# Patient Record
Sex: Female | Born: 1970
Health system: Southern US, Community
[De-identification: ages and names within clinical notes are randomized; demographics above are authoritative.]

## PROBLEM LIST (undated history)

## (undated) DIAGNOSIS — F419 Anxiety disorder, unspecified: Secondary | ICD-10-CM

## (undated) DIAGNOSIS — K219 Gastro-esophageal reflux disease without esophagitis: Secondary | ICD-10-CM

## (undated) DIAGNOSIS — Z8619 Personal history of other infectious and parasitic diseases: Secondary | ICD-10-CM

## (undated) DIAGNOSIS — E559 Vitamin D deficiency, unspecified: Secondary | ICD-10-CM

## (undated) DIAGNOSIS — M549 Dorsalgia, unspecified: Secondary | ICD-10-CM

## (undated) DIAGNOSIS — R609 Edema, unspecified: Secondary | ICD-10-CM

## (undated) DIAGNOSIS — M9420 Chondromalacia, unspecified site: Secondary | ICD-10-CM

## (undated) DIAGNOSIS — I7300 Raynaud's syndrome without gangrene: Secondary | ICD-10-CM

## (undated) DIAGNOSIS — M2550 Pain in unspecified joint: Secondary | ICD-10-CM

## (undated) DIAGNOSIS — M7580 Other shoulder lesions, unspecified shoulder: Secondary | ICD-10-CM

## (undated) DIAGNOSIS — F32A Depression, unspecified: Secondary | ICD-10-CM

## (undated) DIAGNOSIS — F329 Major depressive disorder, single episode, unspecified: Secondary | ICD-10-CM

## (undated) HISTORY — DX: Other shoulder lesions, unspecified shoulder: M75.80

## (undated) HISTORY — DX: Depression, unspecified: F32.A

## (undated) HISTORY — DX: Raynaud's syndrome without gangrene: I73.00

## (undated) HISTORY — DX: Anxiety disorder, unspecified: F41.9

## (undated) HISTORY — DX: Pain in unspecified joint: M25.50

## (undated) HISTORY — DX: Gastro-esophageal reflux disease without esophagitis: K21.9

## (undated) HISTORY — DX: Major depressive disorder, single episode, unspecified: F32.9

## (undated) HISTORY — DX: Dorsalgia, unspecified: M54.9

## (undated) HISTORY — DX: Edema, unspecified: R60.9

## (undated) HISTORY — DX: Vitamin D deficiency, unspecified: E55.9

## (undated) HISTORY — DX: Chondromalacia, unspecified site: M94.20

## (undated) HISTORY — DX: Personal history of other infectious and parasitic diseases: Z86.19

## (undated) HISTORY — PX: TONSILLECTOMY: SUR1361

---

## 1999-03-25 ENCOUNTER — Other Ambulatory Visit: Admission: RE | Admit: 1999-03-25 | Discharge: 1999-03-25 | Payer: Self-pay | Admitting: Gynecology

## 2000-03-26 ENCOUNTER — Other Ambulatory Visit: Admission: RE | Admit: 2000-03-26 | Discharge: 2000-03-26 | Payer: Self-pay | Admitting: Obstetrics and Gynecology

## 2000-03-28 ENCOUNTER — Emergency Department (HOSPITAL_COMMUNITY): Admission: EM | Admit: 2000-03-28 | Discharge: 2000-03-28 | Payer: Self-pay | Admitting: Emergency Medicine

## 2000-03-29 ENCOUNTER — Encounter: Payer: Self-pay | Admitting: Emergency Medicine

## 2001-03-17 ENCOUNTER — Other Ambulatory Visit: Admission: RE | Admit: 2001-03-17 | Discharge: 2001-03-17 | Payer: Self-pay | Admitting: Obstetrics and Gynecology

## 2002-04-14 ENCOUNTER — Other Ambulatory Visit: Admission: RE | Admit: 2002-04-14 | Discharge: 2002-04-14 | Payer: Self-pay | Admitting: Obstetrics and Gynecology

## 2003-05-17 ENCOUNTER — Other Ambulatory Visit: Admission: RE | Admit: 2003-05-17 | Discharge: 2003-05-17 | Payer: Self-pay | Admitting: Obstetrics and Gynecology

## 2003-05-22 ENCOUNTER — Encounter: Admission: RE | Admit: 2003-05-22 | Discharge: 2003-05-22 | Payer: Self-pay | Admitting: Obstetrics and Gynecology

## 2004-06-11 ENCOUNTER — Other Ambulatory Visit: Admission: RE | Admit: 2004-06-11 | Discharge: 2004-06-11 | Payer: Self-pay | Admitting: Obstetrics and Gynecology

## 2004-09-23 ENCOUNTER — Encounter: Admission: RE | Admit: 2004-09-23 | Discharge: 2004-09-23 | Payer: Self-pay | Admitting: Orthopedic Surgery

## 2005-02-10 HISTORY — PX: SHOULDER SURGERY: SHX246

## 2008-07-13 ENCOUNTER — Ambulatory Visit (HOSPITAL_COMMUNITY): Admission: RE | Admit: 2008-07-13 | Discharge: 2008-07-13 | Payer: Self-pay | Admitting: Obstetrics and Gynecology

## 2009-02-10 DIAGNOSIS — Z8619 Personal history of other infectious and parasitic diseases: Secondary | ICD-10-CM

## 2009-02-10 HISTORY — DX: Personal history of other infectious and parasitic diseases: Z86.19

## 2012-05-27 ENCOUNTER — Ambulatory Visit (INDEPENDENT_AMBULATORY_CARE_PROVIDER_SITE_OTHER): Payer: BC Managed Care – PPO | Admitting: Family Medicine

## 2012-05-27 ENCOUNTER — Encounter: Payer: Self-pay | Admitting: Family Medicine

## 2012-05-27 VITALS — BP 129/89 | HR 70 | Ht 61.0 in | Wt 172.0 lb

## 2012-05-27 DIAGNOSIS — R002 Palpitations: Secondary | ICD-10-CM

## 2012-05-27 DIAGNOSIS — R5381 Other malaise: Secondary | ICD-10-CM

## 2012-05-27 DIAGNOSIS — R42 Dizziness and giddiness: Secondary | ICD-10-CM

## 2012-05-27 LAB — CBC WITH DIFFERENTIAL/PLATELET
Basophils Absolute: 0 10*3/uL (ref 0.0–0.1)
Basophils Relative: 0 % (ref 0–1)
Eosinophils Absolute: 0.1 10*3/uL (ref 0.0–0.7)
Eosinophils Relative: 1 % (ref 0–5)
HCT: 40.6 % (ref 36.0–46.0)
Hemoglobin: 13.7 g/dL (ref 12.0–15.0)
Lymphocytes Relative: 26 % (ref 12–46)
Lymphs Abs: 1.8 10*3/uL (ref 0.7–4.0)
MCH: 30.7 pg (ref 26.0–34.0)
MCHC: 33.7 g/dL (ref 30.0–36.0)
MCV: 91 fL (ref 78.0–100.0)
Monocytes Absolute: 0.4 10*3/uL (ref 0.1–1.0)
Monocytes Relative: 7 % (ref 3–12)
Neutro Abs: 4.4 10*3/uL (ref 1.7–7.7)
Neutrophils Relative %: 66 % (ref 43–77)
Platelets: 317 10*3/uL (ref 150–400)
RBC: 4.46 MIL/uL (ref 3.87–5.11)
RDW: 13.1 % (ref 11.5–15.5)
WBC: 6.7 10*3/uL (ref 4.0–10.5)

## 2012-05-27 LAB — COMPREHENSIVE METABOLIC PANEL
ALT: 17 U/L (ref 0–35)
AST: 18 U/L (ref 0–37)
Albumin: 4.4 g/dL (ref 3.5–5.2)
Alkaline Phosphatase: 53 U/L (ref 39–117)
BUN: 19 mg/dL (ref 6–23)
CO2: 27 mEq/L (ref 19–32)
Calcium: 9.1 mg/dL (ref 8.4–10.5)
Chloride: 104 mEq/L (ref 96–112)
Creat: 0.81 mg/dL (ref 0.50–1.10)
Glucose, Bld: 83 mg/dL (ref 70–99)
Potassium: 4.8 mEq/L (ref 3.5–5.3)
Sodium: 138 mEq/L (ref 135–145)
Total Bilirubin: 0.4 mg/dL (ref 0.3–1.2)
Total Protein: 6.7 g/dL (ref 6.0–8.3)

## 2012-05-27 MED ORDER — NIFEDIPINE ER OSMOTIC RELEASE 30 MG PO TB24: 30.0000 mg | ORAL_TABLET | Freq: Every day | ORAL | Status: DC

## 2012-05-27 MED ORDER — MECLIZINE HCL 25 MG PO TABS: 25.0000 mg | ORAL_TABLET | Freq: Three times a day (TID) | ORAL | Status: DC | PRN

## 2012-05-27 NOTE — Progress Notes (Signed)
Subjective:     Patient ID: Grace Horton, female   DOB: 23-Mar-1970, 42 y.o.   MRN: [id number]  HPI Grace Horton is here today complaining of dizziness.  She has been feeling dizzy for about four days.  She describes it as being moderate to severe.  She says that she was getting her hair done yesterday and became so dizzy that she collapsed onto the floor.  She says that she felt so weak that she was unable to get up for about an hour.  Her friends noticed that her bottom lip looked blue.  She was outside and said that she was very cold.  Someone gave her a meclizine to take and she says that it resolved her dizziness.  She also has been monitoring her blood pressure which was higher than normal. She feels perfectly fine today. She did notice that this winter her toes and fingers turned lighter when they got cold.      Review of Systems  Constitutional: Positive for activity change. Negative for fever, fatigue and unexpected weight change (She has lost weight but has been working out.  ).  HENT: Negative for congestion, neck pain and neck stiffness.   Eyes: Negative for visual disturbance.  Respiratory: Negative for shortness of breath.   Cardiovascular: Positive for palpitations. Negative for chest pain and leg swelling.  Neurological: Positive for dizziness. Negative for syncope, weakness and headaches.  Psychiatric/Behavioral: Negative for dysphoric mood. The patient is not nervous/anxious.    Past Medical History  Diagnosis Date  . Chondromalacia   . AC (acromioclavicular) joint bone spurs   . History of shingles     Around Left Eye   Family History  Problem Relation Age of Onset  . Heart disease Father   . Stroke Father        Objective:   Physical Exam  Constitutional: She appears well-nourished. No distress.  HENT:  Head: Normocephalic and atraumatic.  Eyes: Pupils are equal, round, and reactive to light. No scleral icterus.  Neck: Neck supple. No thyromegaly present.   Cardiovascular: Normal rate, regular rhythm and normal heart sounds.   Pulmonary/Chest: Effort normal and breath sounds normal.  Musculoskeletal: She exhibits no edema and no tenderness.  Lymphadenopathy:    She has no cervical adenopathy.  Neurological: She is alert.  Skin: Skin is warm and dry.  Psychiatric: She has a normal mood and affect. Her behavior is normal. Judgment and thought content normal.       Assessment:     Dizziness Raynaud's Syndrome     Plan:     1)  Prescription for Antivert

## 2012-05-27 NOTE — Patient Instructions (Addendum)
1)  Dizziness:  Try the Antivert 1/2 - 1 tab up to 3 times per day.  Try the Hall-Pike maneuver I showed you.  You can also look on You Tube.    Dizziness Dizziness is a common problem. It is a feeling of unsteadiness or lightheadedness. You may feel like you are about to faint. Dizziness can lead to injury if you stumble or fall. A person of any age group can suffer from dizziness, but dizziness is more common in older adults. CAUSES  Dizziness can be caused by many different things, including:  Middle ear problems.  Standing for too long.  Infections.  An allergic reaction.  Aging.  An emotional response to something, such as the sight of blood.  Side effects of medicines.  Fatigue.  Problems with circulation or blood pressure.  Excess use of alcohol, medicines, or illegal drug use.  Breathing too fast (hyperventilation).  An arrhythmia or problems with your heart rhythm.  Low red blood cell count (anemia).  Pregnancy.  Vomiting, diarrhea, fever, or other illnesses that cause dehydration.  Diseases or conditions such as Parkinson's disease, high blood pressure (hypertension), diabetes, and thyroid problems.  Exposure to extreme heat. DIAGNOSIS  To find the cause of your dizziness, your caregiver may do a physical exam, lab tests, radiologic imaging scans, or an electrocardiography test (ECG).  TREATMENT  Treatment of dizziness depends on the cause of your symptoms and can vary greatly. HOME CARE INSTRUCTIONS   Drink enough fluids to keep your urine clear or pale yellow. This is especially important in very hot weather. In the elderly, it is also important in cold weather.  If your dizziness is caused by medicines, take them exactly as directed. When taking blood pressure medicines, it is especially important to get up slowly.  Rise slowly from chairs and steady yourself until you feel okay.  In the morning, first sit up on the side of the bed. When this seems  okay, stand slowly while holding onto something until you know your balance is fine.  If you need to stand in one place for a long time, be sure to move your legs often. Tighten and relax the muscles in your legs while standing.  If dizziness continues to be a problem, have someone stay with you for a day or two. Do this until you feel you are well enough to stay alone. Have the person call your caregiver if he or she notices changes in you that are concerning.  Do not drive or use heavy machinery if you feel dizzy.  Do not drink alcohol. SEEK IMMEDIATE MEDICAL CARE IF:   Your dizziness or lightheadedness gets worse.  You feel nauseous or vomit.  You develop problems with talking, walking, weakness, or using your arms, hands, or legs.  You are not thinking clearly or you have difficulty forming sentences. It may take a friend or family member to determine if your thinking is normal.  You develop chest pain, abdominal pain, shortness of breath, or sweating.  Your vision changes.  You notice any bleeding.  You have side effects from medicine that seems to be getting worse rather than better. MAKE SURE YOU:   Understand these instructions.  Will watch your condition.  Will get help right away if you are not doing well or get worse. Document Released: 07/23/2000 Document Revised: 04/21/2011 Document Reviewed: 08/16/2010 [facility] Patient Information 2013 [place], [place].   Vertigo Vertigo means you feel like you or your surroundings are moving  when they are not. Vertigo can be dangerous if it occurs when you are at work, driving, or performing difficult activities.  CAUSES  Vertigo occurs when there is a conflict of signals sent to your brain from the visual and sensory systems in your body. There are many different causes of vertigo, including:  Infections, especially in the inner ear.  A bad reaction to a drug or misuse of alcohol and medicines.  Withdrawal from drugs or  alcohol.  Rapidly changing positions, such as lying down or rolling over in bed.  A migraine headache.  Decreased blood flow to the brain.  Increased pressure in the brain from a head injury, infection, tumor, or bleeding. SYMPTOMS  You may feel as though the world is spinning around or you are falling to the ground. Because your balance is upset, vertigo can cause nausea and vomiting. You may have involuntary eye movements (nystagmus). DIAGNOSIS  Vertigo is usually diagnosed by physical exam. If the cause of your vertigo is unknown, your caregiver may perform imaging tests, such as an MRI scan (magnetic resonance imaging). TREATMENT  Most cases of vertigo resolve on their own, without treatment. Depending on the cause, your caregiver may prescribe certain medicines. If your vertigo is related to body position issues, your caregiver may recommend movements or procedures to correct the problem. In rare cases, if your vertigo is caused by certain inner ear problems, you may need surgery. HOME CARE INSTRUCTIONS   Follow your caregiver's instructions.  Avoid driving.  Avoid operating heavy machinery.  Avoid performing any tasks that would be dangerous to you or others during a vertigo episode.  Tell your caregiver if you notice that certain medicines seem to be causing your vertigo. Some of the medicines used to treat vertigo episodes can actually make them worse in some people. SEEK IMMEDIATE MEDICAL CARE IF:   Your medicines do not relieve your vertigo or are making it worse.  You develop problems with talking, walking, weakness, or using your arms, hands, or legs.  You develop severe headaches.  Your nausea or vomiting continues or gets worse.  You develop visual changes.  A family member notices behavioral changes.  Your condition gets worse. MAKE SURE YOU:  Understand these instructions.  Will watch your condition.  Will get help right away if you are not doing well or  get worse. Document Released: 11/06/2004 Document Revised: 04/21/2011 Document Reviewed: 08/15/2010 [facility] Patient Information 2013 [place], [place].

## 2012-05-29 ENCOUNTER — Encounter: Payer: Self-pay | Admitting: Family Medicine

## 2012-05-30 ENCOUNTER — Encounter: Payer: Self-pay | Admitting: Family Medicine

## 2012-05-30 DIAGNOSIS — R5383 Other fatigue: Secondary | ICD-10-CM | POA: Insufficient documentation

## 2012-05-30 DIAGNOSIS — R5381 Other malaise: Secondary | ICD-10-CM | POA: Insufficient documentation

## 2012-05-30 DIAGNOSIS — R42 Dizziness and giddiness: Secondary | ICD-10-CM | POA: Insufficient documentation

## 2012-05-30 DIAGNOSIS — R002 Palpitations: Secondary | ICD-10-CM | POA: Insufficient documentation

## 2012-05-31 ENCOUNTER — Encounter: Payer: Self-pay | Admitting: *Deleted

## 2012-06-23 ENCOUNTER — Encounter: Payer: Self-pay | Admitting: Family Medicine

## 2012-06-23 ENCOUNTER — Ambulatory Visit (INDEPENDENT_AMBULATORY_CARE_PROVIDER_SITE_OTHER): Payer: BC Managed Care – PPO | Admitting: Family Medicine

## 2012-06-23 ENCOUNTER — Telehealth: Payer: Self-pay | Admitting: *Deleted

## 2012-06-23 VITALS — BP 111/77 | HR 69 | Wt 175.0 lb

## 2012-06-23 DIAGNOSIS — S61209A Unspecified open wound of unspecified finger without damage to nail, initial encounter: Secondary | ICD-10-CM

## 2012-06-23 DIAGNOSIS — Z23 Encounter for immunization: Secondary | ICD-10-CM

## 2012-06-23 DIAGNOSIS — S61219A Laceration without foreign body of unspecified finger without damage to nail, initial encounter: Secondary | ICD-10-CM

## 2012-06-23 MED ORDER — MUPIROCIN CALCIUM 2 % EX CREA: TOPICAL_CREAM | CUTANEOUS | Status: DC

## 2012-06-23 NOTE — Telephone Encounter (Signed)
HELP

## 2012-06-23 NOTE — Patient Instructions (Addendum)
1)  Apply the Bactroban at first sign of infection.    Staphylococcal Infections  Staphylococcus aureus (Staph) is a germ that may cause infections, especially on broken skin or wounds. Methicillin is a drug sometimes used to treat Staph infections. If the germ is resistant to methicillin, it is called MRSA. Methicillin and some other drugs may not work to treat the infection. However, there are other antibiotic drugs that may be used that will treat the infection. Your caregiver may do a culture from your wound, skin, or other site. This will tell him/her that you have MRSA present. Sometimes healthy people carry MRSA, but it may also cause an infection.  Staph infections, including MRSA can spread from one person to another by contact with an infected person. You may prevent spreading an MRSA infection to those you live with or others around you by following these steps:  Keep infections and pus or drainage material covered with clean dry bandages. Follow your caregiver's instructions on proper care of the wound. Pus from infected wounds can contain MRSA and spread the germ (bacteria) to others.  Advise your family and other close contacts to wash their hands frequently with soap and warm water. This should be done especially if they change your bandages or touch the infected wound or infectious materials.  Avoid sharing personal items (towels, washcloth, razor, clothing, or uniforms) that may have had contact with the infected wound.  Wash linens and clothes that become soiled, with hot water and laundry detergent. Drying clothes in a hot dryer, rather than air-drying, also helps kill bacteria in clothes.  Tell any healthcare providers who treat you that you have an MRSA infection. In the hospital steps will be taken to prevent the spread of MRSA.  Ask your caregiver about return to school or return to work if you have a Staph or MRSA infection.  If your caregiver has given you a follow-up  appointment, it is very important to keep that appointment. Not keeping the appointment could result in a chronic or permanent injury, pain, and disability. If there is any problem keeping the appointment, you must call back to this facility for assistance. Staph and MRSA infections can become very serious and you should contact your caregiver if your infection gets worse. To fight the infection, follow your doctor's instructions for wound care and take all medicines as prescribed. SEEK MEDICAL CARE IF:   You have increased pus coming from the wound.  You have a fever.  You notice a bad smell coming from the wound or dressing. SEEK IMMEDIATE MEDICAL CARE IF:  You have redness, red streaks, swelling, or increasing pain in the wound. Document Released: 04/19/2002 Document Revised: 04/21/2011 Document Reviewed: 09/13/2007 [facility] Patient Information 2013 [place], [place].

## 2012-06-23 NOTE — Progress Notes (Signed)
  Subjective:    Patient ID: Grace Horton, female    DOB: [birth year], 42 y.o.   MRN: [id number]  HPI Grace Horton is here today to have her left thumb evaluated.  She was cutting fruit two days ago and she accidentally cut her left thumb.  She cleaned it very well and applied some Dermabond Liquid Bandage which helped stop the bandage.  She has not had a tetanus shot in over 10 years and would like to get one.  Her dizziness has improved.  She watched the videos on YouTube about vertigo which helped. She has also started taking some supplements for her Raynaud's which she thinks has helped her.     Review of Systems  Constitutional: Negative for fever.  Skin: Positive for wound (laceration on left thumb).  Neurological: Negative for dizziness (Her dizziness resolved.  ).   Past Medical History  Diagnosis Date  . Chondromalacia   . AC (acromioclavicular) joint bone spurs   . History of shingles 2011    Around Left Eye  . Raynaud disease    Family History  Problem Relation Age of Onset  . Heart disease Father   . 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 No distress.  Skin: Skin is warm and dry. No erythema.  She has a 1 cm laceration on the lateral aspect of her left thumb that seems to be healing.  There is currently no sign of infection.       Assessment & Plan:   1)  Laceration:  She was given a prescription for Bactroban to apply if she develops any sign of infection. She received her Tdap without difficulty.

## 2012-09-10 ENCOUNTER — Encounter: Payer: Self-pay | Admitting: *Deleted

## 2012-11-10 ENCOUNTER — Encounter: Payer: Self-pay | Admitting: Family Medicine

## 2012-11-10 ENCOUNTER — Ambulatory Visit (INDEPENDENT_AMBULATORY_CARE_PROVIDER_SITE_OTHER): Payer: BC Managed Care – PPO | Admitting: Family Medicine

## 2012-11-10 VITALS — BP 133/84 | HR 75 | Resp 16 | Ht 61.42 in | Wt 192.0 lb

## 2012-11-10 DIAGNOSIS — E669 Obesity, unspecified: Secondary | ICD-10-CM | POA: Insufficient documentation

## 2012-11-10 DIAGNOSIS — Z23 Encounter for immunization: Secondary | ICD-10-CM

## 2012-11-10 MED ORDER — PHENTERMINE-TOPIRAMATE ER 7.5-46 MG PO CP24: 1.0000 | ORAL_CAPSULE | Freq: Every day | ORAL | Status: DC

## 2012-11-10 MED ORDER — PHENTERMINE-TOPIRAMATE ER 3.75-23 MG PO CP24: 1.0000 | ORAL_CAPSULE | Freq: Every day | ORAL | Status: DC

## 2012-11-10 NOTE — Patient Instructions (Addendum)
1)  Weight  Start on Qsymia 3.75 mg/23 mg for 14 days then increase to the 7.5 mg/46 mg.    1200 calories (Low Carb/High Protein Diet) 1 Hour of Exercise    Exercise to Lose Weight Exercise and a healthy diet may help you lose weight. Your doctor may suggest specific exercises. EXERCISE IDEAS AND TIPS  Choose low-cost things you enjoy doing, such as walking, bicycling, or exercising to workout videos.  Take stairs instead of the elevator.  Walk during your lunch break.  Park your car further away from work or school.  Go to a gym or an exercise class.  Start with 5 to 10 minutes of exercise each day. Build up to 30 minutes of exercise 4 to 6 days a week.  Wear shoes with good support and comfortable clothes.  Stretch before and after working out.  Work out until you breathe harder and your heart beats faster.  Drink extra water when you exercise.  Do not do so much that you hurt yourself, feel dizzy, or get very short of breath. Exercises that burn about 150 calories:  Running 1  miles in 15 minutes.  Playing volleyball for 45 to 60 minutes.  Washing and waxing a car for 45 to 60 minutes.  Playing touch football for 45 minutes.  Walking 1  miles in 35 minutes.  Pushing a stroller 1  miles in 30 minutes.  Playing basketball for 30 minutes.  Raking leaves for 30 minutes.  Bicycling 5 miles in 30 minutes.  Walking 2 miles in 30 minutes.  Dancing for 30 minutes.  Shoveling snow for 15 minutes.  Swimming laps for 20 minutes.  Walking up stairs for 15 minutes.  Bicycling 4 miles in 15 minutes.  Gardening for 30 to 45 minutes.  Jumping rope for 15 minutes.  Washing windows or floors for 45 to 60 minutes. Document Released: 03/01/2010 Document Revised: 04/21/2011 Document Reviewed: 03/01/2010 [facility] Patient Information 2014 [place], [place].

## 2012-11-10 NOTE — Progress Notes (Signed)
  Subjective:    Patient ID: Grace Horton, female    DOB: 09-28-1970, 42 y.o.   MRN: [id number]  HPI  Grace Horton is here today to discuss her weight.  She has gained a considerable about over the past 4 years.  She is not sure what to blame for the weight gain.  She feels that it is partly due to her turning 38, being less active and being in a long term relationship.  She has been struggling to lose weight over the past several months.  She has been working hard to improve her diet by cutting carbs and alcohol and she has been trying to increase her exercise.  She feels that she is not getting anywhere with her current plan.  She has noticed that she has been having increased pain and attributes this pain to her increased weight which is starting to limit her exercise.  She has tried several appetite suppressants in the past which have helped her.   She has been reading about Qsymia and would like to try this medication.       Review of Systems  Constitutional: Negative.   HENT: Negative.   Eyes: Negative.   Respiratory: Negative.   Cardiovascular: Negative.   Gastrointestinal: Negative.   Endocrine: Negative.   Genitourinary: Negative.   Musculoskeletal: Negative.   Skin: Negative.   Allergic/Immunologic: Negative.   Neurological: Negative.   Hematological: Negative.   Psychiatric/Behavioral: Negative.     Past Medical History  Diagnosis Date  . Chondromalacia   . AC (acromioclavicular) joint bone spurs   . History of shingles 2011    Around Left Eye  . Raynaud disease     Family History  Problem Relation Age of Onset  . Heart disease Father   . Stroke Father     History   Social History Narrative   Marital Status: Divorced; (Significant Other - Todd)    Children:  Daughter Dahlia Client) 1997    Pets:  Dog    Living Situation: Lives with Tawanna Cooler and daughter.     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 Constitutional: She is oriented to person, place, and time.  Well groomed female with obese body habitus   HENT:  Head: Normocephalic and atraumatic.  Eyes: Conjunctivae are normal. No scleral icterus.  Neck: Normal range of motion. Neck supple. No thyromegaly present.  Cardiovascular: Normal rate and regular rhythm.   Pulmonary/Chest: Effort normal and breath sounds normal.  Musculoskeletal: She exhibits no edema.  Neurological: She is alert and oriented to person, place, and time.  Skin: Skin is warm and dry. No rash noted.  Psychiatric: She has a normal mood and affect. Her behavior is normal. Judgment and thought content normal.          Assessment & Plan:

## 2012-11-10 NOTE — Assessment & Plan Note (Signed)
She was given a prescription for Qsymia. She is to follow a 1200 calorie diet and get at least 1 hour of exercise daily.  She is to follow up in 3 months for a recheck.  We set a weight loss goal for her of 15 lbs (5 per month) by her next office visit.

## 2012-12-16 ENCOUNTER — Other Ambulatory Visit: Payer: Self-pay

## 2013-02-17 ENCOUNTER — Encounter: Payer: Self-pay | Admitting: Family Medicine

## 2013-02-17 ENCOUNTER — Ambulatory Visit (INDEPENDENT_AMBULATORY_CARE_PROVIDER_SITE_OTHER): Payer: BC Managed Care – PPO | Admitting: Family Medicine

## 2013-02-17 VITALS — BP 111/80 | HR 81 | Resp 16 | Ht 61.0 in | Wt 170.0 lb

## 2013-02-17 DIAGNOSIS — E669 Obesity, unspecified: Secondary | ICD-10-CM

## 2013-02-17 DIAGNOSIS — Z23 Encounter for immunization: Secondary | ICD-10-CM | POA: Insufficient documentation

## 2013-02-17 MED ORDER — PHENTERMINE-TOPIRAMATE ER 7.5-46 MG PO CP24: 1.0000 | ORAL_CAPSULE | Freq: Every day | ORAL | Status: DC

## 2013-02-17 NOTE — Patient Instructions (Addendum)
1)  Weight - Keep up the good work.  The next thing you need to do is increase your exercise.  Goal - 1 hour per day.      Exercise to Lose Weight Exercise and a healthy diet may help you lose weight. Your doctor may suggest specific exercises. EXERCISE IDEAS AND TIPS  Choose low-cost things you enjoy doing, such as walking, bicycling, or exercising to workout videos.  Take stairs instead of the elevator.  Walk during your lunch break.  Park your car further away from work or school.  Go to a gym or an exercise class.  Start with 5 to 10 minutes of exercise each day. Build up to 30 minutes of exercise 4 to 6 days a week.  Wear shoes with good support and comfortable clothes.  Stretch before and after working out.  Work out until you breathe harder and your heart beats faster.  Drink extra water when you exercise.  Do not do so much that you hurt yourself, feel dizzy, or get very short of breath. Exercises that burn about 150 calories:  Running 1  miles in 15 minutes.  Playing volleyball for 45 to 60 minutes.  Washing and waxing a car for 45 to 60 minutes.  Playing touch football for 45 minutes.  Walking 1  miles in 35 minutes.  Pushing a stroller 1  miles in 30 minutes.  Playing basketball for 30 minutes.  Raking leaves for 30 minutes.  Bicycling 5 miles in 30 minutes.  Walking 2 miles in 30 minutes.  Dancing for 30 minutes.  Shoveling snow for 15 minutes.  Swimming laps for 20 minutes.  Walking up stairs for 15 minutes.  Bicycling 4 miles in 15 minutes.  Gardening for 30 to 45 minutes.  Jumping rope for 15 minutes.  Washing windows or floors for 45 to 60 minutes. Document Released: 03/01/2010 Document Revised: 04/21/2011 Document Reviewed: 03/01/2010 [facility] Patient Information 2014 [place], [place].

## 2013-02-17 NOTE — Progress Notes (Signed)
Subjective:    Patient ID: Grace Horton, female    DOB: [birth year], 43 y.o.   MRN: [id number]  HPI  Grace Horton is here today to get her medication refills.  Grace Horton has done great on her Qsymia (7.5-46 mg, daily).  Grace Horton has lost 22 lbs since her last office visit.  Grace Horton would like to continue on it.     Review of Systems  Constitutional: Positive for appetite change. Negative for activity change (Grace Horton is planning to start exercising.  ) and fatigue.       Grace Horton has lost 22 lbs!    Respiratory: Negative for chest tightness and shortness of breath.   Cardiovascular: Negative for chest pain and palpitations.  Neurological:       Occasional tingling sensation in the tip of her nose and toes   Psychiatric/Behavioral: Negative for sleep disturbance and decreased concentration.    Past Medical History  Diagnosis Date  . Chondromalacia   . AC (acromioclavicular) joint bone spurs   . History of shingles 2011    Around Left Eye  . Raynaud disease      Past Surgical History  Procedure Laterality Date  . Tonsillectomy       History   Social History Narrative   Marital Status: Divorced; (Significant Other - Grace Horton)    Children:  Grace Horton Grace Horton) 1997    Pets:  Dog    Living Situation: Lives with Grace Horton and Grace Horton.     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 Heart disease Father   . Diabetes Father   . Kidney disease Father     Stage III   . Transient ischemic attack Father     S/P Carotid endarterectomy  . Irritable bowel syndrome Mother      Current Outpatient Prescriptions on File Prior to Visit  Medication Sig Dispense Refill  . ethynodiol-ethinyl estradiol (KELNOR,ZOVIA) 1-35 MG-MCG tablet Take 1  tablet by mouth daily.      . meclizine (ANTIVERT) 25 MG tablet Take 1 tablet (25 mg total) by mouth 3 (three) times daily as needed for dizziness or nausea.  30 tablet  1  . valACYclovir (VALTREX) 500 MG tablet        No current facility-administered medications on file prior to visit.     No Known Allergies   Immunization History  Administered Date(s) Administered  . Influenza,inj,Quad PF,36+ Mos 11/10/2012  . Tdap 06/23/2012      Objective:   Physical Exam  Vitals reviewed. Constitutional: Grace Horton is oriented to person, place, and time.  HENT:  Head: Normocephalic and atraumatic.  Eyes: Conjunctivae are normal. No scleral icterus.  Neck: Normal range of motion. Neck supple. No thyromegaly present.  Cardiovascular: Normal rate and regular rhythm.   Pulmonary/Chest: Effort normal and breath sounds normal.  Musculoskeletal: Grace Horton exhibits no edema.  Neurological: Grace Horton is alert and oriented to person, place, and time.  Skin: Skin is warm and dry. No rash noted.  Psychiatric: Grace Horton has a normal mood and affect. Her behavior is normal. Judgment and thought content normal.      Assessment & Plan:  Gerldine was seen today for obesity.  Diagnoses and associated orders for this visit:  Obesity, unspecified - Phentermine-Topiramate (QSYMIA) 7.5-46 MG CP24; Take 1 capsule by mouth daily.  Refilled x 3 months.

## 2013-02-17 NOTE — Assessment & Plan Note (Signed)
The patient confirmed that they are not allergic to eggs and have never had a bad reaction with the flu shot in the past.  The vaccination was given without difficulty.   

## 2013-02-17 NOTE — Addendum Note (Signed)
Addended by: Eleanora Neighbor D on: 02/17/2013 08:57 AM   Modules accepted: Orders

## 2013-05-24 ENCOUNTER — Ambulatory Visit (INDEPENDENT_AMBULATORY_CARE_PROVIDER_SITE_OTHER): Payer: BC Managed Care – PPO | Admitting: Family Medicine

## 2013-05-24 ENCOUNTER — Encounter: Payer: Self-pay | Admitting: Family Medicine

## 2013-05-24 VITALS — BP 129/84 | HR 97 | Resp 16 | Ht 62.0 in | Wt 160.0 lb

## 2013-05-24 DIAGNOSIS — E669 Obesity, unspecified: Secondary | ICD-10-CM

## 2013-05-24 MED ORDER — PHENTERMINE-TOPIRAMATE ER 7.5-46 MG PO CP24: 1.0000 | ORAL_CAPSULE | Freq: Every day | ORAL | Status: DC

## 2013-05-24 NOTE — Progress Notes (Signed)
   Subjective:    Patient ID: Grace Horton, female    DOB: 06/20/1970, 43 y.o.   MRN: [id number]  HPI  Grace Horton is here today to follow up on her weight. She has lost another 10 pounds since January which brings her total weight lost to 32 pounds since October. She feels that she has hit a plateau and would like to increase her dosage of Qsymia.    Review of Systems  Constitutional: Negative for activity change, appetite change, fatigue and unexpected weight change.       She has lost another 10 pounds on the Qsymia  All other systems reviewed and are negative.   Past Medical History  Diagnosis Date  . Chondromalacia   . AC (acromioclavicular) joint bone spurs   . History of shingles 2011    Around Left Eye  . Raynaud disease      Past Surgical History  Procedure Laterality Date  . Tonsillectomy    . Cesarean section    . Shoulder surgery Left 2007     History   Social History Narrative   Marital Status: Divorced; (Significant Other - Todd)    Children:  Daughter Jarrett Soho) 1997    Pets:  Dog    Living Situation: Lives with Sherren Mocha and daughter.     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 Heart disease Father   . Diabetes Father   . Kidney disease Father     Stage III   . Transient ischemic attack Father     S/P Carotid endarterectomy  . Irritable bowel syndrome Mother      Current Outpatient Prescriptions on File Prior to Visit  Medication Sig Dispense Refill  . ethynodiol-ethinyl estradiol (KELNOR,ZOVIA) 1-35 MG-MCG tablet Take 1 tablet by mouth daily.      . valACYclovir (VALTREX) 500 MG tablet        No current facility-administered medications on file prior to visit.     No  Known Allergies   Immunization History  Administered Date(s) Administered  . Influenza,inj,Quad PF,36+ Mos 11/10/2012  . Tdap 06/23/2012       Objective:   Physical Exam  Nursing note and vitals reviewed. Constitutional: She is oriented to person, place, and time.  HENT:  Head: Normocephalic and atraumatic.  Eyes: Conjunctivae are normal. No scleral icterus.  Neck: Normal range of motion. Neck supple. No thyromegaly present.  Cardiovascular: Normal rate and regular rhythm.   Pulmonary/Chest: Effort normal and breath sounds normal.  Musculoskeletal: She exhibits no edema.  Neurological: She is alert and oriented to person, place, and time.  Skin: Skin is warm and dry. No rash noted.  Psychiatric: She has a normal mood and affect. Her behavior is normal. Judgment and thought content normal.      Assessment & Plan:    Grace Horton was seen today for weight check.  Diagnoses and associated orders for this visit:  Obesity, unspecified - Phentermine-Topiramate (QSYMIA) 7.5-46 MG CP24; Take 1 capsule by mouth daily.

## 2013-05-27 ENCOUNTER — Telehealth: Payer: Self-pay | Admitting: *Deleted

## 2013-05-27 NOTE — Telephone Encounter (Signed)
Her Qsymia was approved until April 2016. Approval # [id number]

## 2013-09-09 ENCOUNTER — Encounter: Payer: Self-pay | Admitting: Family Medicine

## 2013-09-09 ENCOUNTER — Ambulatory Visit (INDEPENDENT_AMBULATORY_CARE_PROVIDER_SITE_OTHER): Payer: BC Managed Care – PPO | Admitting: Family Medicine

## 2013-09-09 VITALS — BP 114/70 | HR 60 | Resp 16 | Ht 62.0 in | Wt 159.0 lb

## 2013-09-09 DIAGNOSIS — Z639 Problem related to primary support group, unspecified: Secondary | ICD-10-CM

## 2013-09-09 DIAGNOSIS — F439 Reaction to severe stress, unspecified: Secondary | ICD-10-CM

## 2013-09-09 DIAGNOSIS — B009 Herpesviral infection, unspecified: Secondary | ICD-10-CM

## 2013-09-09 DIAGNOSIS — E669 Obesity, unspecified: Secondary | ICD-10-CM

## 2013-09-09 DIAGNOSIS — B001 Herpesviral vesicular dermatitis: Secondary | ICD-10-CM

## 2013-09-09 MED ORDER — ESCITALOPRAM OXALATE 5 MG PO TABS: 5.0000 mg | ORAL_TABLET | Freq: Every day | ORAL | Status: DC

## 2013-09-09 MED ORDER — VALACYCLOVIR HCL 1 G PO TABS: ORAL_TABLET | ORAL | Status: AC

## 2013-09-09 MED ORDER — PHENTERMINE-TOPIRAMATE ER 7.5-46 MG PO CP24: 1.0000 | ORAL_CAPSULE | Freq: Every day | ORAL | Status: DC

## 2013-09-09 NOTE — Progress Notes (Signed)
Subjective:    Patient ID: Grace Horton, female    DOB: 02-Feb-1971, 43 y.o.   MRN: [id number]  HPI  Grace Horton is here today to discuss the following conditions and for medication refills.    1)  Weight - She has taken Qsymia off and on for several months.  She had lost about 30 lbs but has recently gained back 7 after a week trip to the beach.  She wants to get back on track with Qsymia.    2)  HSV 1 - She needs a refill for Valtrex.   3)  Stress - Her mood is good on Lexapro.     Review of Systems  Constitutional: Negative for activity change, appetite change and fatigue.  Cardiovascular: Negative for chest pain, palpitations and leg swelling.  Psychiatric/Behavioral: Negative for behavioral problems. The patient is not nervous/anxious.   All other systems reviewed and are negative.    Past Medical History  Diagnosis Date  . Chondromalacia   . AC (acromioclavicular) joint bone spurs   . History of shingles 2011    Around Left Eye  . Raynaud disease      Past Surgical History  Procedure Laterality Date  . Tonsillectomy    . Cesarean section    . Shoulder surgery Left 2007     History   Social History Narrative   Marital Status: Divorced; (Significant Other - Grace Horton)    Children:  Daughter Grace Horton) 1997    Pets:  Dog    Living Situation: Lives with Grace Horton and daughter.     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 Heart disease Father   . Diabetes Father   . Kidney disease Father     Stage III   . Transient ischemic attack Father     S/P Carotid endarterectomy  . Irritable bowel syndrome Mother      Current Outpatient Prescriptions on File Prior to Visit  Medication Sig Dispense  Refill  . ethynodiol-ethinyl estradiol (KELNOR,ZOVIA) 1-35 MG-MCG tablet Take 1 tablet by mouth daily.       No current facility-administered medications on file prior to visit.     No Known Allergies   Immunization History  Administered Date(s) Administered  . Influenza,inj,Quad PF,36+ Mos 11/10/2012  . Tdap 06/23/2012       Objective:   Physical Exam  Nursing note and vitals reviewed. Constitutional: She is oriented to person, place, and time.  HENT:  Head: Normocephalic and atraumatic.  Eyes: Conjunctivae are normal. No scleral icterus.  Neck: Normal range of motion. Neck supple. No thyromegaly present.  Cardiovascular: Normal rate and regular rhythm.   Pulmonary/Chest: Effort normal and breath sounds normal.  Musculoskeletal: She exhibits no edema.  Neurological: She is alert and oriented to person, place, and time.  Skin: Skin is warm and dry. No rash noted.  Psychiatric: She has a normal mood and affect. Her behavior is normal. Judgment and thought content normal.      Assessment & Plan:    Grace Horton was seen today for medication management.  Diagnoses and associated orders for this visit:  Fever blister - valACYclovir (  VALTREX) 1000 MG tablet; Take 1/2 - 1 tab daily for prevention or 2 tabs at onset of symptoms and 2 more in 12 hours  Obesity, unspecified - Phentermine-Topiramate (QSYMIA) 7.5-46 MG CP24; Take 1 capsule by mouth daily.  Stress at home - escitalopram (LEXAPRO) 5 MG tablet; Take 1 tablet (5 mg total) by mouth daily.   TIME SPENT "FACE TO FACE" WITH PATIENT -  30 MINS

## 2013-10-09 DIAGNOSIS — F439 Reaction to severe stress, unspecified: Secondary | ICD-10-CM | POA: Insufficient documentation

## 2013-10-09 DIAGNOSIS — Z639 Problem related to primary support group, unspecified: Secondary | ICD-10-CM | POA: Insufficient documentation

## 2013-10-09 DIAGNOSIS — B001 Herpesviral vesicular dermatitis: Secondary | ICD-10-CM | POA: Insufficient documentation

## 2014-09-26 ENCOUNTER — Ambulatory Visit (INDEPENDENT_AMBULATORY_CARE_PROVIDER_SITE_OTHER)
Admission: RE | Admit: 2014-09-26 | Discharge: 2014-09-26 | Disposition: A | Discharge status: Home / Self Care | Payer: BC Managed Care – PPO | Source: Ambulatory Visit | Attending: Family Medicine | Admitting: Family Medicine

## 2014-09-26 ENCOUNTER — Ambulatory Visit (INDEPENDENT_AMBULATORY_CARE_PROVIDER_SITE_OTHER): Payer: BC Managed Care – PPO

## 2014-09-26 ENCOUNTER — Ambulatory Visit (INDEPENDENT_AMBULATORY_CARE_PROVIDER_SITE_OTHER): Payer: BC Managed Care – PPO | Admitting: Family Medicine

## 2014-09-26 ENCOUNTER — Encounter: Payer: Self-pay | Admitting: Family Medicine

## 2014-09-26 VITALS — BP 136/82 | HR 74 | Wt 171.0 lb

## 2014-09-26 DIAGNOSIS — M25512 Pain in left shoulder: Secondary | ICD-10-CM | POA: Diagnosis not present

## 2014-09-26 DIAGNOSIS — M7552 Bursitis of left shoulder: Secondary | ICD-10-CM | POA: Diagnosis not present

## 2014-09-26 DIAGNOSIS — M542 Cervicalgia: Secondary | ICD-10-CM | POA: Diagnosis not present

## 2014-09-26 DIAGNOSIS — M5010 Cervical disc disorder with radiculopathy, unspecified cervical region: Secondary | ICD-10-CM | POA: Diagnosis not present

## 2014-09-26 MED ORDER — TIZANIDINE HCL 4 MG PO TABS: 4.0000 mg | ORAL_TABLET | Freq: Every evening | ORAL | Status: DC

## 2014-09-26 MED ORDER — MELOXICAM 15 MG PO TABS: 15.0000 mg | ORAL_TABLET | Freq: Every day | ORAL | Status: DC

## 2014-09-26 NOTE — Patient Instructions (Addendum)
Good to see you.  Ice 20 minutes 2 times daily. Usually after activity and before bed. Exercises 3 times a week. We will focus on shoulder and upper back Xray downstairs today  Meloxicam daily fo r10 days then as needed Zanaflex at night to help Keep hand within peripheral vision  See me again in 2-3 weeks.   Standing:  Secure a rubber exercise band/tubing so that it is at the height of your shoulders when you are either standing or sitting on a firm arm-less chair.  Grasp an end of the band/tubing in each hand and have your palms face each other. Straighten your elbows and lift your hands straight in front of you at shoulder height. Step back away from the secured end of band/tubing until it becomes tense.  Squeeze your shoulder blades together. Keeping your elbows locked and your hands at shoulder-height, bring your hands out to your side.  Hold __________ seconds. Slowly ease the tension on the band/tubing as you reverse the directions and return to the starting position. Repeat __________ times. Complete this exercise __________ times per day. STRENGTH - Scapular Retractors  Secure a rubber exercise band/tubing so that it is at the height of your shoulders when you are either standing or sitting on a firm arm-less chair.  With a palm-down grip, grasp an end of the band/tubing in each hand. Straighten your elbows and lift your hands straight in front of you at shoulder height. Step back away from the secured end of band/tubing until it becomes tense.  Squeezing your shoulder blades together, draw your elbows back as you bend them. Keep your upper arm lifted away from your body throughout the exercise.  Hold __________ seconds. Slowly ease the tension on the band/tubing as you reverse the directions and return to the starting position. Repeat __________ times. Complete this exercise __________ times per day. STRENGTH - Shoulder Extensors   Secure a rubber exercise band/tubing so that  it is at the height of your shoulders when you are either standing or sitting on a firm arm-less chair.  With a thumbs-up grip, grasp an end of the band/tubing in each hand. Straighten your elbows and lift your hands straight in front of you at shoulder height. Step back away from the secured end of band/tubing until it becomes tense.  Squeezing your shoulder blades together, pull your hands down to the sides of your thighs. Do not allow your hands to go behind you.  Hold for __________ seconds. Slowly ease the tension on the band/tubing as you reverse the directions and return to the starting position. Repeat __________ times. Complete this exercise __________ times per day.  STRENGTH - Scapular Retractors and External Rotators  Secure a rubber exercise band/tubing so that it is at the height of your shoulders when you are either standing or sitting on a firm arm-less chair.  With a palm-down grip, grasp an end of the band/tubing in each hand. Bend your elbows 90 degrees and lift your elbows to shoulder height at your sides. Step back away from the secured end of band/tubing until it becomes tense.  Squeezing your shoulder blades together, rotate your shoulder so that your upper arm and elbow remain stationary, but your fists travel upward to head-height.  Hold __________ for seconds. Slowly ease the tension on the band/tubing as you reverse the directions and return to the starting position. Repeat __________ times. Complete this exercise __________ times per day.  STRENGTH - Scapular Retractors and External Rotators, Rowing  Secure a rubber exercise band/tubing so that it is at the height of your shoulders when you are either standing or sitting on a firm arm-less chair.  With a palm-down grip, grasp an end of the band/tubing in each hand. Straighten your elbows and lift your hands straight in front of you at shoulder height. Step back away from the secured end of band/tubing until it  becomes tense.  Step 1: Squeeze your shoulder blades together. Bending your elbows, draw your hands to your chest as if you are rowing a boat. At the end of this motion, your hands and elbow should be at shoulder-height and your elbows should be out to your sides.  Step 2: Rotate your shoulder to raise your hands above your head. Your forearms should be vertical and your upper-arms should be horizontal.  Hold for __________ seconds. Slowly ease the tension on the band/tubing as you reverse the directions and return to the starting position. Repeat __________ times. Complete this exercise __________ times per day.  STRENGTH - Scapular Retractors and Elevators  Secure a rubber exercise band/tubing so that it is at the height of your shoulders when you are either standing or sitting on a firm arm-less chair.  With a thumbs-up grip, grasp an end of the band/tubing in each hand. Step back away from the secured end of band/tubing until it becomes tense.  Squeezing your shoulder blades together, straighten your elbows and lift your hands straight over your head.  Hold for __________ seconds. Slowly ease the tension on the band/tubing as you reverse the directions and return to the starting position. Repeat __________ times. Complete this exercise __________ times per day.  Document Released: 01/27/2005 Document Revised: 04/21/2011 Document Reviewed: 05/11/2008 [name] Patient Information 2015 [place], [place]. This information is not intended to replace advice given to you by your health care provider. Make sure you discuss any questions you have with your health care provider.

## 2014-09-26 NOTE — Progress Notes (Signed)
[facility] [place] [address] [place], [place] [address] Phone: [phone] Subjective:    I'm seeing this patient by the request  of:  Ival Bible, MD   CC: Neck pain  [id number] Shermaine Glendenning is a 44 y.o. female coming in with complaint of neck pain. Patient also complaining the left shoulder pain. Patient states that the neck pain has been going on longer. States his been greater than 3 months. Was seen a chiropractor as well as a physical therapist for this. Not making any improvement. Worse when she turns her head to the left. Sometimes a sharp stabbing pain. States that it's uncomfortable even at night now. Patient was given a muscle relaxer previously with no significant improvement and made her too tired. Patient denies any radiation down the hand or any numbness or weakness but states that now she is having more of a left shoulder pain as well. Does not know if they are related. Rates the severity of pain a 7 out of 10.  Patient states her left shoulder pain seems to be a little bit different. Worse with motion such as reaching across her body or holding anything in that arm. States that it seems to be more localized. Can have the shoulder pain without the neck pain. Patient has not tried any over-the-counter medications. Has not tried any home modalities. Continues to do daily activities. Rates the severity of 5 out of 10 but now is waking her up at night which is the more alarming and concerning part.  Past Medical History  Diagnosis Date  . Chondromalacia   . AC (acromioclavicular) joint bone spurs   . History of shingles 2011    Around Left Eye  . Raynaud disease    Past Surgical History  Procedure Laterality Date  . Tonsillectomy    . Cesarean section    . Shoulder surgery Left 2007   Social History  Substance Use Topics  . Smoking status: Never Smoker   . Smokeless tobacco: Never Used  . Alcohol Use: No   No Known Allergies Family  History  Problem Relation Age of Onset  . Heart disease Father   . Diabetes Father   . Kidney disease Father     Stage III   . Transient ischemic attack Father     S/P Carotid endarterectomy  . Irritable bowel syndrome Mother         Past medical history, social, surgical and family history all reviewed in electronic medical record.   Review of Systems: No headache, visual changes, nausea, vomiting, diarrhea, constipation, dizziness, abdominal pain, skin rash, fevers, chills, night sweats, weight loss, swollen lymph nodes, body aches, joint swelling, muscle aches, chest pain, shortness of breath, mood changes.   Objective Blood pressure 136/82, pulse 74, weight 171 lb (77.565 kg), SpO2 99 %.  General: No apparent distress alert and oriented x3 mood and affect normal, dressed appropriately.  HEENT: Pupils equal, extraocular movements intact  Respiratory: Patient's speak in full sentences and does not appear short of breath  Cardiovascular: No lower extremity edema, non tender, no erythema  Skin: Warm dry intact with no signs of infection or rash on extremities or on axial skeleton.  Abdomen: Soft nontender  Neuro: Cranial nerves II through XII are intact, neurovascularly intact in all extremities with 2+ DTRs and 2+ pulses.  Lymph: No lymphadenopathy of posterior or anterior cervical chain or axillae bilaterally.  Gait normal with good balance and coordination.  MSK:  Non  tender with full range of motion and good stability and symmetric strength and tone of  elbows, wrist, hip, knee and ankles bilaterally.  Neck: Inspection unremarkable. No palpable stepoffs. Positive Spurling's maneuver with minimal radicular symptoms of C4-C5. Patient does have decrease range of motion lacking the last 5 of extension as well as side bending bilaterally in the last 10 of left-sided rotation Grip strength and sensation normal in bilateral hands Strength good C4 to T1 distribution No sensory  change to C4 to T1 Negative Hoffman sign bilaterally Reflexes normal  Shoulder: left Inspection reveals no abnormalities, atrophy or asymmetry. Palpation is normal with no tenderness over AC joint or bicipital groove. ROM is full in all planes passively. Rotator cuff strength normal throughout. signs of impingement with positive Neer and Hawkin's tests, but negative empty can sign. Speeds and Yergason's tests normal. No labral pathology noted with negative Obrien's, negative clunk and good stability. Normal scapular function observed. No painful arc and no drop arm sign. No apprehension sign  MSK US performed of: left This study was ordered, performed, and interpreted by Charlann Boxer D.O.  Shoulder:   Supraspinatus:  Appears normal on long and transverse views, Bursal bulge seen with shoulder abduction on impingement view. Infraspinatus:  Appears normal on long and transverse views. Significant increase in Doppler flow Subscapularis:  Appears normal on long and transverse views. Positive bursa Teres Minor:  Appears normal on long and transverse views. AC joint:  Capsule undistended, no geyser sign. Glenohumeral Joint:  Appears normal without effusion. Glenoid Labrum:  Intact without visualized tears. Biceps Tendon:  Appears normal on long and transverse views, no fraying of tendon, tendon located in intertubercular groove, no subluxation with shoulder internal or external rotation.  Impression: Subacromial bursitis  Procedure: Real-time Ultrasound Guided Injection of left glenohumeral joint Device: GE Logiq E  Ultrasound guided injection is preferred based studies that show increased duration, increased effect, greater accuracy, decreased procedural pain, increased response rate with ultrasound guided versus blind injection.  Verbal informed consent obtained.  Time-out conducted.  Noted no overlying erythema, induration, or other signs of local infection.  Skin prepped in a sterile  fashion.  Local anesthesia: Topical Ethyl chloride.  With sterile technique and under real time ultrasound guidance:  Joint visualized.  23g 1  inch needle inserted posterior approach. Pictures taken for needle placement. Patient did have injection of 2 cc of 1% lidocaine, 2 cc of 0.5% Marcaine, and 1.0 cc of Kenalog 40 mg/dL. Completed without difficulty  Pain immediately resolved suggesting accurate placement of the medication.  Advised to call if fevers/chills, erythema, induration, drainage, or persistent bleeding.  Images permanently stored and available for review in the ultrasound unit.  Impression: Technically successful ultrasound guided injection.   Procedure note 10071; 15 minutes spent for Therapeutic exercises as stated in above notes.  This included exercises focusing on stretching, strengthening, with significant focus on eccentric aspects.Shoulder Exercises that included:  Basic scapular stabilization to include adduction and depression of scapula Scaption, focusing on proper movement and good control Internal and External rotation utilizing a theraband, with elbow tucked at side entire time Rows with theraband   Proper technique shown and discussed handout in great detail with ATC.  All questions were discussed and answered.     Impression and Recommendations:     This case required medical decision making of moderate complexity.

## 2014-09-26 NOTE — Assessment & Plan Note (Signed)
Patient is had this pain for greater than 3 months. Before patient saw me she had been seen by a physical therapist as well as a Restaurant manager, fast food. States that it seems to be worsening. Mostly on the left side and does have a positive Spurling's test today. X-rays ordered today. We discussed postural changes exercises and ergonomics patient can change. Patient will try these different changes and come back and see me again in 3 weeks. Continuing to have symptoms or any worsening radicular symptoms or weakness we will need to consider advanced imaging.

## 2014-09-26 NOTE — Progress Notes (Signed)
Pre visit review using our clinic review tool, if applicable. No additional management support is needed unless otherwise documented below in the visit note. 

## 2014-09-26 NOTE — Assessment & Plan Note (Signed)
Given an injection today. Patient also has some acromioclavicular type pain could be contributing and if no significant improvement we may need to consider an injection in this area. Patient given anti-inflammatory's as well as a muscle relaxer to take. We discussed icing regimen and patient work with Product/process development scientist to learn home exercises. Patient will come back and see me again in 3 weeks for further evaluation and treatment.

## 2014-10-12 ENCOUNTER — Ambulatory Visit (INDEPENDENT_AMBULATORY_CARE_PROVIDER_SITE_OTHER): Payer: BC Managed Care – PPO | Admitting: Family Medicine

## 2014-10-12 ENCOUNTER — Encounter: Payer: Self-pay | Admitting: Family Medicine

## 2014-10-12 VITALS — BP 132/84 | HR 78 | Ht 62.0 in | Wt 169.0 lb

## 2014-10-12 DIAGNOSIS — M6248 Contracture of muscle, other site: Secondary | ICD-10-CM

## 2014-10-12 DIAGNOSIS — M9908 Segmental and somatic dysfunction of rib cage: Secondary | ICD-10-CM | POA: Diagnosis not present

## 2014-10-12 DIAGNOSIS — M999 Biomechanical lesion, unspecified: Secondary | ICD-10-CM | POA: Insufficient documentation

## 2014-10-12 DIAGNOSIS — M9902 Segmental and somatic dysfunction of thoracic region: Secondary | ICD-10-CM | POA: Diagnosis not present

## 2014-10-12 DIAGNOSIS — M62838 Other muscle spasm: Secondary | ICD-10-CM | POA: Insufficient documentation

## 2014-10-12 DIAGNOSIS — M9901 Segmental and somatic dysfunction of cervical region: Secondary | ICD-10-CM

## 2014-10-12 MED ORDER — HYDROXYZINE HCL 25 MG PO TABS: 25.0000 mg | ORAL_TABLET | Freq: Three times a day (TID) | ORAL | Status: DC | PRN

## 2014-10-12 NOTE — Progress Notes (Signed)
Pre visit review using our clinic review tool, if applicable. No additional management support is needed unless otherwise documented below in the visit note. 

## 2014-10-12 NOTE — Assessment & Plan Note (Signed)
I do believe the patient is having more of the trapezius muscle spasm. This seems to be more secondary and associated with her stress level as well as poor posture. Encourage her to do more postural exercises and showed her proper technique. Discussed ergonomics throughout the day. Discussed icing regimen. Patient did respond fairly well to osteopathic manipulation. Patient will discuss with primary care provider about the underlying anxiety that can be contribute in. Hydroxyzine given for any breakthrough anxiety. Will come back again in 3 weeks for further evaluation and treatment.

## 2014-10-12 NOTE — Patient Instructions (Signed)
Good to see you continue to work work on the scapula strength.  Increase your protein to 1 gram daily per pound.  Ice is your friend  Stay hydrated Try the hydroxyzine 3 times daily as needed.  See me again in 4 weeks.

## 2014-10-12 NOTE — Progress Notes (Signed)
[facility] [place], [place] [address] Phone: [phone] Subjective:    I'm seeing this patient by the request  of:  ZANARD, ROBYN, MD   CC: Neck pain left shoulder pain follow-up  [name] Grace Horton is a 44 y.o. female coming in with complaint of neck pain. Patient was found to have some mild cervical radiculopathy of C4-C5. Patient was to have x-rays of her neck. These were reviewed by me. X-rays of the cervical spine were completely unremarkable for any bony of her mallet. Patient was given postural exercises and range of motion exercises. Patient states states neck pain is better. States about 75% better. States muscle relaxer has been helpful. Denies swelling and denies any significant radicular symptoms.  Patient was also having left shoulder pain. Patient was found to have a subacromial bursitis and was given an injection. Patient was given home exercises and work with Product/process development scientist, oral anti-inflammatory's, icing protocol. Patient given a muscle relaxer as well. Patient states the shoulder is 60-70% better. Still having some mild discomfort mostly in the posterior aspect of the back as well as near the scapula itself. Patient does notice some association with stress.  Past Medical History  Diagnosis Date  . Chondromalacia   . AC (acromioclavicular) joint bone spurs   . History of shingles 2011    Around Left Eye  . Raynaud disease    Past Surgical History  Procedure Laterality Date  . Tonsillectomy    . Cesarean section    . Shoulder surgery Left 2007   Social History  Substance Use Topics  . Smoking status: Never Smoker   . Smokeless tobacco: Never Used  . Alcohol Use: No   No Known Allergies Family History  Problem Relation Age of Onset  . Heart disease Father   . Diabetes Father   . Kidney disease Father     Stage III   . Transient ischemic attack Father     S/P Carotid endarterectomy  . Irritable bowel  syndrome Mother         Past medical history, social, surgical and family history all reviewed in electronic medical record.   Review of Systems: No headache, visual changes, nausea, vomiting, diarrhea, constipation, dizziness, abdominal pain, skin rash, fevers, chills, night sweats, weight loss, swollen lymph nodes, body aches, joint swelling, muscle aches, chest pain, shortness of breath, mood changes.   Objective Blood pressure 132/84, pulse 78, height 5\' 2"  (1.575 m), weight 169 lb (76.658 kg), SpO2 99 %.  General: No apparent distress alert and oriented x3 mood and affect normal, dressed appropriately.  HEENT: Pupils equal, extraocular movements intact  Respiratory: Patient's speak in full sentences and does not appear short of breath  Cardiovascular: No lower extremity edema, non tender, no erythema  Skin: Warm dry intact with no signs of infection or rash on extremities or on axial skeleton.  Abdomen: Soft nontender  Neuro: Cranial nerves II through XII are intact, neurovascularly intact in all extremities with 2+ DTRs and 2+ pulses.  Lymph: No lymphadenopathy of posterior or anterior cervical chain or axillae bilaterally.  Gait normal with good balance and coordination.  MSK:  Non tender with full range of motion and good stability and symmetric strength and tone of  elbows, wrist, hip, knee and ankles bilaterally.  Neck: Inspection unremarkable. No palpable stepoffs. Negative Spurling's Full range of motion but patient does have tightness of the trapezius on the left side Grip strength and sensation normal  in bilateral hands Strength good C4 to T1 distribution No sensory change to C4 to T1 Negative Hoffman sign bilaterally Reflexes normal  Shoulder: left Inspection reveals no abnormalities, atrophy or asymmetry. Palpation is normal with no tenderness over AC joint or bicipital groove. ROM is full in all planes passively. Rotator cuff strength normal throughout. Minimal  impingement still remaining Speeds and Yergason's tests normal. No labral pathology noted with negative Obrien's, negative clunk and good stability. Normal scapular function observed. No painful arc and no drop arm sign. No apprehension sign  Osteopathic findings C6 flexed rotated and side bent left T1 extended rotated and side bent right with elevated first rib T3 extended rotated and side bent right    Impression and Recommendations:     This case required medical decision making of moderate complexity.

## 2014-10-12 NOTE — Assessment & Plan Note (Signed)
Decision today to treat with OMT was based on Physical Exam  After verbal consent patient was treated with HVLA, ME techniques in cervical, thoracic and rib areas  Patient tolerated the procedure well with improvement in symptoms  Patient given exercises, stretches and lifestyle modifications  See medications in patient instructions if given  Patient will follow up in 3 weeks  

## 2014-10-18 ENCOUNTER — Ambulatory Visit: Payer: BC Managed Care – PPO | Admitting: Family Medicine

## 2014-11-09 ENCOUNTER — Telehealth: Payer: Self-pay | Admitting: Family Medicine

## 2014-11-09 NOTE — Telephone Encounter (Signed)
Discussed with pt. She was unable to do Monday so she is coming in 10.6.16.

## 2014-11-09 NOTE — Telephone Encounter (Signed)
This pt called  In and said that she has a really bad pain in her rib.  She is not sure if she popped her rib out again but she would like to know if there is anyway you could work her in today?  She is in pain

## 2014-11-09 NOTE — Telephone Encounter (Signed)
Tell her sorry Ibuprofen 800mg  3 times a day until I see her on Monday.

## 2014-11-13 ENCOUNTER — Ambulatory Visit: Payer: BC Managed Care – PPO | Admitting: Family Medicine

## 2014-11-16 ENCOUNTER — Encounter: Payer: Self-pay | Admitting: Family Medicine

## 2014-11-16 ENCOUNTER — Ambulatory Visit (INDEPENDENT_AMBULATORY_CARE_PROVIDER_SITE_OTHER): Payer: BC Managed Care – PPO | Admitting: Family Medicine

## 2014-11-16 VITALS — BP 118/84 | HR 79 | Ht 62.0 in | Wt 176.0 lb

## 2014-11-16 DIAGNOSIS — M7552 Bursitis of left shoulder: Secondary | ICD-10-CM

## 2014-11-16 DIAGNOSIS — M999 Biomechanical lesion, unspecified: Secondary | ICD-10-CM

## 2014-11-16 DIAGNOSIS — M9901 Segmental and somatic dysfunction of cervical region: Secondary | ICD-10-CM

## 2014-11-16 DIAGNOSIS — M6248 Contracture of muscle, other site: Secondary | ICD-10-CM

## 2014-11-16 DIAGNOSIS — M9902 Segmental and somatic dysfunction of thoracic region: Secondary | ICD-10-CM | POA: Diagnosis not present

## 2014-11-16 DIAGNOSIS — M9908 Segmental and somatic dysfunction of rib cage: Secondary | ICD-10-CM

## 2014-11-16 DIAGNOSIS — M62838 Other muscle spasm: Secondary | ICD-10-CM

## 2014-11-16 MED ORDER — DICLOFENAC SODIUM 2 % TD SOLN: TRANSDERMAL | Status: DC

## 2014-11-16 NOTE — Progress Notes (Signed)
Pre visit review using our clinic review tool, if applicable. No additional management support is needed unless otherwise documented below in the visit note. 

## 2014-11-16 NOTE — Assessment & Plan Note (Signed)
Decision today to treat with OMT was based on Physical Exam  After verbal consent patient was treated with HVLA, ME techniques in cervical, thoracic and rib areas  Patient tolerated the procedure well with improvement in symptoms  Patient given exercises, stretches and lifestyle modifications  See medications in patient instructions if given  Patient will follow up in 3-4 weeks      

## 2014-11-16 NOTE — Assessment & Plan Note (Signed)
I do believe the patient's muscle spasm is secondary to poor posture in the fatigue of the muscle. Encourage her to continue to watch her posture as well as at work as well as on her free time. We discussed doing the exercises on a more regular basis. Patient does have a muscle relaxer for breakthrough pain and was given topical anti-inflammatory's which I hope will be beneficial. We discussed icing regimen. Patient will come back again in 4 weeks for further evaluation.

## 2014-11-16 NOTE — Patient Instructions (Addendum)
Great to see you Continue what you are doing Iron 65mg  daily pennsaid pinkie amount topically 2 times daily as needed.  Turmeric 500mg  twice daily Ice is still your friend Continue to watch shoulder and when I see you we will do injection . If it doesn't help we will need an MRI.  See me again in 4 weeks.

## 2014-11-16 NOTE — Progress Notes (Signed)
[facility] [place], [place] [address] Phone: [phone] Subjective:    I'm seeing this patient by the request  of:  ZANARD, ROBYN, MD   CC: Neck pain left shoulder pain follow-up  [id number] Grace Horton is a 44 y.o. female coming in with complaint of neck pain. Patient was found to have some mild cervical radiculopathy of C4-C5. Patient was to have x-rays of her neck. These were reviewed by me. X-rays of the cervical spine were completely unremarkable.  Patient has continued to work on her posture. Patient has noticed that the rib slip within the last week but otherwise had been doing very well. Patient states that now some mild discomfort of the upper back but as long she continues he exercises she feels like she is doing better.  Patient was also having left shoulder pain. Patient was found to have a subacromial bursitis and was given an injection. P was doing much better but has noticed some increasing pain again. Seems to be more on the anterior ensuring through to the posterior aspect of the shoulder. Can be very uncomfortable at night. Has had an injury as well as surgery to the shoulder previously.  Past Medical History  Diagnosis Date  . Chondromalacia   . AC (acromioclavicular) joint bone spurs   . History of shingles 2011    Around Left Eye  . Raynaud disease    Past Surgical History  Procedure Laterality Date  . Tonsillectomy    . Cesarean section    . Shoulder surgery Left 2007   Social History  Substance Use Topics  . Smoking status: Never Smoker   . Smokeless tobacco: Never Used  . Alcohol Use: No   No Known Allergies Family History  Problem Relation Age of Onset  . Heart disease Father   . Diabetes Father   . Kidney disease Father     Stage III   . Transient ischemic attack Father     S/P Carotid endarterectomy  . Irritable bowel syndrome Mother         Past medical history, social, surgical and  family history all reviewed in electronic medical record.   Review of Systems: No headache, visual changes, nausea, vomiting, diarrhea, constipation, dizziness, abdominal pain, skin rash, fevers, chills, night sweats, weight loss, swollen lymph nodes, body aches, joint swelling, muscle aches, chest pain, shortness of breath, mood changes.   Objective Blood pressure 118/84, pulse 79, height 5\' 2"  (1.575 m), weight 176 lb (79.833 kg), SpO2 98 %.  General: No apparent distress alert and oriented x3 mood and affect normal, dressed appropriately.  HEENT: Pupils equal, extraocular movements intact  Respiratory: Patient's speak in full sentences and does not appear short of breath  Cardiovascular: No lower extremity edema, non tender, no erythema  Skin: Warm dry intact with no signs of infection or rash on extremities or on axial skeleton.  Abdomen: Soft nontender  Neuro: Cranial nerves II through XII are intact, neurovascularly intact in all extremities with 2+ DTRs and 2+ pulses.  Lymph: No lymphadenopathy of posterior or anterior cervical chain or axillae bilaterally.  Gait normal with good balance and coordination.  MSK:  Non tender with full range of motion and good stability and symmetric strength and tone of  elbows, wrist, hip, knee and ankles bilaterally.  Neck: Inspection unremarkable. No palpable stepoffs. Negative Spurling's Continued mild tightness of the left-sided trapezius muscle Grip strength and sensation normal in bilateral hands Strength good C4  to T1 distribution No sensory change to C4 to T1 Negative Hoffman sign bilaterally Reflexes normal  Shoulder: left Inspection reveals no abnormalities, atrophy or asymmetry. Palpation is normal with no tenderness over AC joint or bicipital groove. ROM is full in all planes passively. Rotator cuff strength normal throughout. Positive impingement Speeds and Yergason's tests normal. Positive O'Brien's Normal scapular function  observed. No painful arc and no drop arm sign. No apprehension sign  Osteopathic findings C6 flexed rotated and side bent left T1 extended rotated and side bent right with elevated first rib T3 extended rotated and side bent right T8 extended rotated and side bent right   Impression and Recommendations:     This case required medical decision making of moderate complexity.

## 2014-11-16 NOTE — Assessment & Plan Note (Signed)
Increasing pain at this time. Discussed with patient I would like one more month before trying injection again. I am concerned the patient may have some labral pathology that advance imaging may be necessary. If any worsening symptoms occur we will order advanced imaging. Patient will continue with the formal physical therapy and the home exercises as well.

## 2014-12-22 ENCOUNTER — Encounter: Payer: Self-pay | Admitting: Family Medicine

## 2014-12-22 ENCOUNTER — Ambulatory Visit (INDEPENDENT_AMBULATORY_CARE_PROVIDER_SITE_OTHER): Payer: BC Managed Care – PPO | Admitting: Family Medicine

## 2014-12-22 VITALS — BP 126/84 | HR 84 | Ht 62.0 in | Wt 184.0 lb

## 2014-12-22 DIAGNOSIS — M9901 Segmental and somatic dysfunction of cervical region: Secondary | ICD-10-CM | POA: Diagnosis not present

## 2014-12-22 DIAGNOSIS — M9902 Segmental and somatic dysfunction of thoracic region: Secondary | ICD-10-CM

## 2014-12-22 DIAGNOSIS — M7552 Bursitis of left shoulder: Secondary | ICD-10-CM

## 2014-12-22 DIAGNOSIS — M9908 Segmental and somatic dysfunction of rib cage: Secondary | ICD-10-CM

## 2014-12-22 DIAGNOSIS — M999 Biomechanical lesion, unspecified: Secondary | ICD-10-CM

## 2014-12-22 DIAGNOSIS — M5010 Cervical disc disorder with radiculopathy, unspecified cervical region: Secondary | ICD-10-CM

## 2014-12-22 MED ORDER — NITROGLYCERIN 0.2 MG/HR TD PT24: MEDICATED_PATCH | TRANSDERMAL | Status: DC

## 2014-12-22 MED ORDER — VENLAFAXINE HCL ER 75 MG PO CP24: 75.0000 mg | ORAL_CAPSULE | Freq: Every day | ORAL | Status: DC

## 2014-12-22 NOTE — Patient Instructions (Signed)
Good to see you Ice is your friend Stop wellbutrin Take effexor 75mg  daily Start nitro patch Nitroglycerin Protocol   Apply 1/4 nitroglycerin patch to affected area daily.  Change position of patch within the affected area every 24 hours.  You may experience a headache during the first 1-2 weeks of using the patch, these should subside.  If you experience headaches after beginning nitroglycerin patch treatment, you may take your preferred over the counter pain reliever.  Another side effect of the nitroglycerin patch is skin irritation or rash related to patch adhesive.  Please notify our office if you develop more severe headaches or rash, and stop the patch.  Tendon healing with nitroglycerin patch may require 12 to 24 weeks depending on the extent of injury.  Men should not use if taking Viagra, Cialis, or Levitra.   Do not use if you have migraines or rosacea.   See me again in 4 weeks.

## 2014-12-22 NOTE — Progress Notes (Signed)
Pre visit review using our clinic review tool, if applicable. No additional management support is needed unless otherwise documented below in the visit note. 

## 2014-12-22 NOTE — Assessment & Plan Note (Signed)
Underlying arthritis could be contributing somewhat is responding well. I do think that patient's posterior labral tear is likely more controlled being to her pain. Encourage her to continue to monitor posture and to do home exercises on a regular basis. Patient will come back and see me again in 4-6 weeks. Stopped Wellbutrin and started effexor.

## 2014-12-22 NOTE — Assessment & Plan Note (Signed)
Started nitro will give 4 weeks and if not better ? Injection vs MRI. Warned the potential side affects.

## 2014-12-22 NOTE — Assessment & Plan Note (Signed)
Decision today to treat with OMT was based on Physical Exam  After verbal consent patient was treated with HVLA, ME techniques in cervical, thoracic and rib areas  Patient tolerated the procedure well with improvement in symptoms  Patient given exercises, stretches and lifestyle modifications  See medications in patient instructions if given  Patient will follow up in 3-4 weeks      

## 2014-12-22 NOTE — Progress Notes (Signed)
[facility] [place], [place] [address] Phone: [phone] Subjective:    I'm seeing this patient by the request  of:  ZANARD, ROBYN, MD   CC: Neck pain left shoulder pain follow-up  [id number] Grace [facility] is a 44 y.o. female coming in with complaint of neck pain. Patient was found to have some mild cervical radiculopathy of C4-C5. Patient was to have x-rays of her neck. These were reviewed by me. X-rays of the cervical spine were completely unremarkable.  Patient has continued to work on her posture. Patient has noticed that the rib slip within the last week but otherwise had been doing very well.  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 Patient states that a lot of overhead activity was done. No new symptoms is worsening of previous symptoms.  Patient was also having left shoulder pain. Patient was found to have a subacromial bursitis and Patient has signs and symptoms of a labral tear. Patient states that the pain seems to be worsening somewhat. Not as bad as it was previously. States though that it can affect some of her daily activities.  Past Medical History  Diagnosis Date  . Chondromalacia   . AC (acromioclavicular) joint bone spurs   . History of shingles 2011    Around Left Eye  . Raynaud disease    Past Surgical History  Procedure Laterality Date  . Tonsillectomy    . Cesarean section    . Shoulder surgery Left 2007   Social History  Substance Use Topics  . Smoking status: Never Smoker   . Smokeless tobacco: Never Used  . Alcohol Use: No   No Known Allergies Family History  Problem Relation Age of Onset  . Heart disease Father   . Diabetes Father   . Kidney disease Father     Stage III   . Transient ischemic attack Father     S/P Carotid endarterectomy  . Irritable bowel syndrome Mother         Past medical history, social, surgical and family history all reviewed in electronic  medical record.   Review of Systems: No headache, visual changes, nausea, vomiting, diarrhea, constipation, dizziness, abdominal pain, skin rash, fevers, chills, night sweats, weight loss, swollen lymph nodes, body aches, joint swelling, muscle aches, chest pain, shortness of breath, mood changes.   Objective Blood pressure 126/84, pulse 84, height 5\' 2"  (1.575 m), weight 184 lb (83.462 kg), SpO2 98 %.  General: No apparent distress alert and oriented x3 mood and affect normal, dressed appropriately.  HEENT: Pupils equal, extraocular movements intact  Respiratory: Patient's speak in full sentences and does not appear short of breath  Cardiovascular: No lower extremity edema, non tender, no erythema  Skin: Warm dry intact with no signs of infection or rash on extremities or on axial skeleton.  Abdomen: Soft nontender  Neuro: Cranial nerves II through XII are intact, neurovascularly intact in all extremities with 2+ DTRs and 2+ pulses.  Lymph: No lymphadenopathy of posterior or anterior cervical chain or axillae bilaterally.  Gait normal with good balance and coordination.  MSK:  Non tender with full range of motion and good stability and symmetric strength and tone of  elbows, wrist, hip, knee and ankles bilaterally.  Neck: Inspection unremarkable. No palpable stepoffs. Negative Spurling's Continued mild tightness of the left-sided trapezius muscle Grip strength and sensation normal in bilateral hands Strength good C4 to T1 distribution No sensory change to C4 to T1  Negative Hoffman sign bilaterally Reflexes normal  Shoulder: left Inspection reveals no abnormalities, atrophy or asymmetry. Palpation is normal with no tenderness over AC joint or bicipital groove. ROM is full in all planes passively. Rotator cuff strength normal throughout. Positive impingement Speeds and Yergason's tests normal. Positive O'Brien's Normal scapular function observed. No painful arc and no drop arm  sign. No apprehension sign  Osteopathic findings C6 flexed rotated and side bent left T1 extended rotated and side bent right with elevated first rib T3 extended rotated and side bent right T8 extended rotated and side bent right   Impression and Recommendations:     This case required medical decision making of moderate complexity.

## 2014-12-29 ENCOUNTER — Telehealth: Payer: Self-pay | Admitting: Family Medicine

## 2014-12-29 NOTE — Telephone Encounter (Signed)
Patient was given new prescriptions at her last appt and she now feeling extremely fatigued. Can you please give her a call [phone]

## 2014-12-29 NOTE — Telephone Encounter (Signed)
Spoke with pt, per dr Tamala Julian, it's the nitro patch that's causing her the fatigue. Discussed with the pt she could either cut the patch down to 1/8 or she could stop the patch all together. Pt understood.

## 2015-01-05 ENCOUNTER — Other Ambulatory Visit: Payer: Self-pay | Admitting: Family Medicine

## 2015-01-08 NOTE — Telephone Encounter (Signed)
Refill done.  

## 2015-01-19 ENCOUNTER — Ambulatory Visit (INDEPENDENT_AMBULATORY_CARE_PROVIDER_SITE_OTHER): Payer: BC Managed Care – PPO | Admitting: Family Medicine

## 2015-01-19 ENCOUNTER — Encounter: Payer: Self-pay | Admitting: Family Medicine

## 2015-01-19 VITALS — BP 132/80 | HR 75 | Wt 186.0 lb

## 2015-01-19 DIAGNOSIS — M5010 Cervical disc disorder with radiculopathy, unspecified cervical region: Secondary | ICD-10-CM

## 2015-01-19 DIAGNOSIS — M9901 Segmental and somatic dysfunction of cervical region: Secondary | ICD-10-CM | POA: Diagnosis not present

## 2015-01-19 DIAGNOSIS — M9908 Segmental and somatic dysfunction of rib cage: Secondary | ICD-10-CM | POA: Diagnosis not present

## 2015-01-19 DIAGNOSIS — M9902 Segmental and somatic dysfunction of thoracic region: Secondary | ICD-10-CM | POA: Diagnosis not present

## 2015-01-19 DIAGNOSIS — F439 Reaction to severe stress, unspecified: Secondary | ICD-10-CM

## 2015-01-19 DIAGNOSIS — Z638 Other specified problems related to primary support group: Secondary | ICD-10-CM

## 2015-01-19 DIAGNOSIS — M999 Biomechanical lesion, unspecified: Secondary | ICD-10-CM

## 2015-01-19 MED ORDER — TIZANIDINE HCL 4 MG PO TABS: 4.0000 mg | ORAL_TABLET | Freq: Every day | ORAL | Status: DC

## 2015-01-19 NOTE — Progress Notes (Signed)
[facility] [place], [place] [address] Phone: [phone] Subjective:     CC: Neck pain left shoulder pain follow-up  [id number] Grace Horton is a 44 y.o. female coming in with complaint of neck pain. Patient was found to have some mild cervical radiculopathy of C4-C5. Patient was to have x-rays of her neck. These were reviewed by me. X-rays of the cervical spine were completely unremarkable.  Patient has continued to work on her posture. Patient has noticed that the rib slip within the last week but otherwise had been doing very well.  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 Patient states that a lot of overhead activity was done. No new symptoms is worsening of previous symptoms. Having worsening symptoms at last exam.  Started effexor and nitro, could not tolerate nitro so stopped it. States overall the dull aches are less in the back and neck.  Trying to be more active but very tired.    Patient was also having left shoulder pain. Patient was found to have a subacromial bursitis and Patient has signs and symptoms of a labral tear. Started nitro but unable to tolerate it. States shoulder pain is still there but not severe to stop her from activity.  States still pain with certain motions but nothing that keep s her from activity.     Past Medical History  Diagnosis Date  . Chondromalacia   . AC (acromioclavicular) joint bone spurs   . History of shingles 2011    Around Left Eye  . Raynaud disease    Past Surgical History  Procedure Laterality Date  . Tonsillectomy    . Cesarean section    . Shoulder surgery Left 2007   Social History  Substance Use Topics  . Smoking status: Never Smoker   . Smokeless tobacco: Never Used  . Alcohol Use: No   No Known Allergies Family History  Problem Relation Age of Onset  . Heart disease Father   . Diabetes Father   . Kidney disease Father     Stage III   .  Transient ischemic attack Father     S/P Carotid endarterectomy  . Irritable bowel syndrome Mother         Past medical history, social, surgical and family history all reviewed in electronic medical record.   Review of Systems: No headache, visual changes, nausea, vomiting, diarrhea, constipation, dizziness, abdominal pain, skin rash, fevers, chills, night sweats, weight loss, swollen lymph nodes, body aches, joint swelling, muscle aches, chest pain, shortness of breath, mood changes.   Objective Blood pressure 132/80, pulse 75, weight 186 lb (84.369 kg), SpO2 97 %.  General: No apparent distress alert and oriented x3 mood and affect normal, dressed appropriately.  HEENT: Pupils equal, extraocular movements intact  Respiratory: Patient's speak in full sentences and does not appear short of breath  Cardiovascular: No lower extremity edema, non tender, no erythema  Skin: Warm dry intact with no signs of infection or rash on extremities or on axial skeleton.  Abdomen: Soft nontender  Neuro: Cranial nerves II through XII are intact, neurovascularly intact in all extremities with 2+ DTRs and 2+ pulses.  Lymph: No lymphadenopathy of posterior or anterior cervical chain or axillae bilaterally.  Gait normal with good balance and coordination.  MSK:  Non tender with full range of motion and good stability and symmetric strength and tone of  elbows, wrist, hip, knee and ankles bilaterally.  Neck: Inspection unremarkable. No  palpable stepoffs. Negative Spurling's Continued mild tightness of the left-sided trapezius muscle Grip strength and sensation normal in bilateral hands Strength good C4 to T1 distribution No sensory change to C4 to T1 Negative Hoffman sign bilaterally Reflexes normal  Shoulder: left Inspection reveals no abnormalities, atrophy or asymmetry. Palpation is normal with no tenderness over AC joint or bicipital groove. ROM is full in all planes passively. Rotator cuff  strength normal throughout. Positive impingement still  Speeds and Yergason's tests normal. Positive O'Brien's still present.  Normal scapular function observed. No painful arc and no drop arm sign. No apprehension sign  Osteopathic findings C2 F RS right C6 flexed rotated and side bent left T1 extended rotated and side bent right with elevated first rib T3 extended rotated and side bent right T8 extended rotated and side bent right :L2 F RS right   Impression and Recommendations:     This case required medical decision making of moderate complexity.

## 2015-01-19 NOTE — Patient Instructions (Addendum)
Good to see you Ice is your friend Happy holidays!  Send me message in 2 weeks and we may increase effexor to 150 Refilled muscle relaxer See me again in 4-6 weeks

## 2015-01-19 NOTE — Assessment & Plan Note (Signed)
Continue effexor may need to increase to 150mg , will discuss at follow up. No side effects.

## 2015-01-19 NOTE — Assessment & Plan Note (Signed)
Overall doing well, has early DDD in neck and OA in knee.  Question need for work up will discuss at follow up.  Continue on posture, responds to OMT well.  Discussed action plan if radicular symptoms.  Ice when needed and continue effexor .  RTC in 4-6 weeks.

## 2015-01-19 NOTE — Assessment & Plan Note (Signed)
Decision today to treat with OMT was based on Physical Exam  After verbal consent patient was treated with HVLA, ME techniques in cervical, thoracic and rib areas  Patient tolerated the procedure well with improvement in symptoms  Patient given exercises, stretches and lifestyle modifications  See medications in patient instructions if given  Patient will follow up in 4-6 weeks                                           

## 2015-03-02 ENCOUNTER — Ambulatory Visit: Payer: BC Managed Care – PPO | Admitting: Family Medicine

## 2015-07-03 MED FILL — PHENTERMINE 37.5 MG TABLET: 37.5 | 30 days supply | Qty: 30 | Fill #0

## 2015-09-04 MED FILL — PHENTERMINE 37.5 MG TABLET: 37.5 | 30 days supply | Qty: 30 | Fill #1

## 2016-05-29 ENCOUNTER — Encounter (INDEPENDENT_AMBULATORY_CARE_PROVIDER_SITE_OTHER): Payer: BC Managed Care – PPO | Admitting: Family Medicine

## 2016-06-25 ENCOUNTER — Encounter (INDEPENDENT_AMBULATORY_CARE_PROVIDER_SITE_OTHER): Payer: Self-pay | Admitting: Family Medicine

## 2016-06-25 ENCOUNTER — Ambulatory Visit (INDEPENDENT_AMBULATORY_CARE_PROVIDER_SITE_OTHER): Payer: BC Managed Care – PPO | Admitting: Family Medicine

## 2016-06-25 VITALS — BP 122/78 | HR 71 | Temp 98.4°F | Resp 16 | Ht 62.0 in | Wt 219.0 lb

## 2016-06-25 DIAGNOSIS — R0602 Shortness of breath: Secondary | ICD-10-CM

## 2016-06-25 DIAGNOSIS — R600 Localized edema: Secondary | ICD-10-CM | POA: Diagnosis not present

## 2016-06-25 DIAGNOSIS — Z1331 Encounter for screening for depression: Secondary | ICD-10-CM

## 2016-06-25 DIAGNOSIS — Z9189 Other specified personal risk factors, not elsewhere classified: Secondary | ICD-10-CM

## 2016-06-25 DIAGNOSIS — Z0289 Encounter for other administrative examinations: Secondary | ICD-10-CM

## 2016-06-25 DIAGNOSIS — Z1389 Encounter for screening for other disorder: Secondary | ICD-10-CM

## 2016-06-25 DIAGNOSIS — R5383 Other fatigue: Secondary | ICD-10-CM | POA: Diagnosis not present

## 2016-06-25 NOTE — Progress Notes (Signed)
Office: [phone]  /  Fax: [phone]   HPI:   Chief Complaint: OBESITY  Grace Horton (MR# [id number]) is a 46 y.o. female who presents on 06/25/2016 for obesity evaluation and treatment. Current BMI is Body mass index is 40.06 kg/m.Marland Kitchen Malin has struggled with obesity for years and has been unsuccessful in either losing weight or maintaining long term weight loss. Cattleya attended our information session and states she is currently in the action stage of change and ready to dedicate time achieving and maintaining a healthier weight.  Saroya states her family eats meals together she thinks her family will eat healthier with  her her desired weight loss is 70 lbs she has been heavy most of  her life she started gaining weight after had child; lost weight around 38-gained back a lot more over last 15 months her heaviest weight ever was 220 lbs. she has significant food cravings issues  she skips meals frequently she is trying to eat vegetarian she is frequently drinking liquids with calories she frequently makes poor food choices she has problems with excessive hunger  she has binge eating behaviors she struggles with emotional eating    Fatigue Waynetta feels her energy is lower than it should be. This has worsened with weight gain and has not worsened recently. Keni admits to daytime somnolence and  admits to waking up still tired. Patient is at risk for obstructive sleep apnea. Patent has a history of symptoms of daytime fatigue and morning fatigue. Patient generally gets 7 hours of sleep per night, and states they generally have restful sleep. Snoring is present. Apneic episodes are present. Epworth Sleepiness Score is 3  Dyspnea on exertion Amonda notes increasing shortness of breath with exercising and seems to be worsening over time with weight gain. She notes getting out of breath sooner with activity than she used to. This has not gotten worse recently. Michella denies  orthopnea.  Bilateral Lower Extremity Edema Korinna notes bilateral feet swelling, worse with increased travel for work. She is on HCTZ currently. This has worsened in the last year with significant weight gain.  At risk for cardiovascular disease Tawan is at a higher than average risk for cardiovascular disease due to obesity, palpitations and bilateral lower extremity edema. She currently denies any chest pain.  Depression Screen Xochil's Food and Mood (modified PHQ-9) score was  Depression screen PHQ 2/9 06/25/2016  Decreased Interest 3  Down, Depressed, Hopeless 1  PHQ - 2 Score 4  Altered sleeping 0  Tired, decreased energy 3  Change in appetite 2  Feeling bad or failure about yourself  1  Trouble concentrating 1  Moving slowly or fidgety/restless 3  Suicidal thoughts 0  PHQ-9 Score 14    ALLERGIES: No Known Allergies  MEDICATIONS: Current Outpatient Prescriptions on File Prior to Visit  Medication Sig Dispense Refill  . Diclofenac Sodium (PENNSAID) 2 % SOLN Fingertip amount to affected area twice daily 1 Bottle 2  . ethynodiol-ethinyl estradiol (KELNOR,ZOVIA) 1-35 MG-MCG tablet Take 1 tablet by mouth daily.     No current facility-administered medications on file prior to visit.     PAST MEDICAL HISTORY: Past Medical History:  Diagnosis Date  . AC (acromioclavicular) joint bone spurs   . Anxiety   . Back pain   . Chondromalacia   . Depression   . GERD (gastroesophageal reflux disease)   . History of shingles 2011   Around Left Eye  . Joint pain   . Raynaud disease   .  Swelling    in feet or legs  . Vitamin D deficiency     PAST SURGICAL HISTORY: Past Surgical History:  Procedure Laterality Date  . CESAREAN SECTION    . SHOULDER SURGERY Left 2007  . TONSILLECTOMY      SOCIAL HISTORY: Social History  Substance Use Topics  . Smoking status: Never Smoker  . Smokeless tobacco: Never Used  . Alcohol use No    FAMILY HISTORY: Family History    Problem Relation Age of Onset  . Heart disease Father   . Diabetes Father   . Kidney disease Father        Stage III   . Transient ischemic attack Father        S/P Carotid endarterectomy  . Hypertension Father   . Hyperlipidemia Father   . Irritable bowel syndrome Mother   . Hypertension Mother   . Hyperlipidemia Mother   . Obesity Mother     ROS: Review of Systems  Constitutional: Positive for malaise/fatigue.  HENT: Positive for congestion (nasal stuffiness) and hearing loss.   Eyes:       Wear Glasses or Contacts  Respiratory: Positive for shortness of breath (with exertion).   Cardiovascular: Positive for palpitations and leg swelling (bilateral lower extremity edema). Negative for chest pain and orthopnea.       Leg Pain with walking (recent-right leg) Very Cold Feet  Gastrointestinal: Positive for heartburn.  Musculoskeletal: Positive for joint pain.  Skin:       Dryness     PHYSICAL EXAM: Blood pressure 122/78, pulse 71, temperature 98.4 F (36.9 C), temperature source Oral, resp. rate 16, height 5\' 2"  (1.575 m), weight 219 lb (99.3 kg), SpO2 98 %. Body mass index is 40.06 kg/m. Physical Exam  Constitutional: She is oriented to person, place, and time. She appears well-developed and well-nourished.  Cardiovascular: Normal rate.   Murmur (grade II/IV early systolic murmur) heard. Pulmonary/Chest: Effort normal.  Musculoskeletal: Normal range of motion. She exhibits edema (bilateral lower extremity).  Neurological: She is oriented to person, place, and time.  Skin: Skin is warm and dry.  Psychiatric: She has a normal mood and affect. Her behavior is normal.  Vitals reviewed.   RECENT LABS AND TESTS: BMET    Component Value Date/Time   NA 138 05/27/2012 1119   K 4.8 05/27/2012 1119   CL 104 05/27/2012 1119   CO2 27 05/27/2012 1119   GLUCOSE 83 05/27/2012 1119   BUN 19 05/27/2012 1119   CREATININE 0.81 05/27/2012 1119   CALCIUM 9.1 05/27/2012 1119    No results found for: HGBA1C No results found for: INSULIN CBC    Component Value Date/Time   WBC 6.7 05/27/2012 1119   RBC 4.46 05/27/2012 1119   HGB 13.7 05/27/2012 1119   HCT 40.6 05/27/2012 1119   PLT 317 05/27/2012 1119   MCV 91.0 05/27/2012 1119   MCH 30.7 05/27/2012 1119   MCHC 33.7 05/27/2012 1119   RDW 13.1 05/27/2012 1119   LYMPHSABS 1.8 05/27/2012 1119   MONOABS 0.4 05/27/2012 1119   EOSABS 0.1 05/27/2012 1119   BASOSABS 0.0 05/27/2012 1119   Iron/TIBC/Ferritin/ %Sat No results found for: IRON, TIBC, FERRITIN, IRONPCTSAT Lipid Panel  No results found for: CHOL, TRIG, HDL, CHOLHDL, VLDL, LDLCALC, LDLDIRECT Hepatic Function Panel     Component Value Date/Time   PROT 6.7 05/27/2012 1119   ALBUMIN 4.4 05/27/2012 1119   AST 18 05/27/2012 1119   ALT 17 05/27/2012 1119   ALKPHOS  53 05/27/2012 1119   BILITOT 0.4 05/27/2012 1119   No results found for: TSH  ECG  shows NSR with a rate of 74 BPM INDIRECT CALORIMETER done today shows a VO2 of 201 and a REE of 1397.    ASSESSMENT AND PLAN: Other fatigue - Plan: EKG 12-Lead, Vitamin B12, CBC With Differential, Comprehensive metabolic panel, Folate, Hemoglobin A1c, Insulin, random, Lipid Panel With LDL/HDL Ratio, T3, T4, free, TSH, VITAMIN D 25 Hydroxy (Vit-D Deficiency, Fractures)  Shortness of breath on exertion  Bilateral lower extremity edema  Depression screening  At risk for heart disease  Morbid obesity (HCC)  PLAN:  Fatigue Tiegan was informed that her fatigue may be related to obesity, depression or many other causes. Labs will be ordered, and in the meanwhile Neleh has agreed to work on diet, exercise and weight loss to help with fatigue. Proper sleep hygiene was discussed including the need for 7-8 hours of quality sleep each night. A sleep study was not ordered based on symptoms and Epworth score.  Dyspnea on exertion Amiria's shortness of breath appears to be obesity related and exercise  induced. She has agreed to work on weight loss and gradually increase exercise to treat her exercise induced shortness of breath. If Victoriah follows our instructions and loses weight without improvement of her shortness of breath, we will plan to refer to pulmonology. We will monitor this condition regularly. Sarha agrees to this plan.  Bilateral Lower Extremity Edema We will check labs and Matalyn agrees to continue HCTZ and follow up with our clinic in 2 weeks.  Cardiovascular risk counselling Loye was given extended (at least 15 minutes) coronary artery disease prevention counseling today. She is 46 y.o. female and has risk factors for heart disease including obesity, palpitations and bilateral lower extremity edema. We discussed intensive lifestyle modifications today with an emphasis on specific weight loss instructions and strategies. Pt was also informed of the importance of increasing exercise and decreasing saturated fats to help prevent heart disease.  Depression Screen Seanne had a moderately positive depression screening. Depression is commonly associated with obesity and often results in emotional eating behaviors. We will monitor this closely and work on CBT to help improve the non-hunger eating patterns. Referral to Psychology may be required if no improvement is seen as she continues in our clinic.  Obesity Radhika is currently in the action stage of change and her goal is to continue with weight loss efforts She has agreed to follow our protein rich vegetarian plan Carmita has been instructed to work up to a goal of 150 minutes of combined cardio and strengthening exercise per week for weight loss and overall health benefits. We discussed the following Behavioral Modification Strategies today: increasing lean protein intake, decreasing simple carbohydrates , decrease eating out and work on meal planning and easy cooking plans  Annalaura has agreed to follow up with our clinic in 2  weeks. She was informed of the importance of frequent follow up visits to maximize her success with intensive lifestyle modifications for her multiple health conditions. She was informed we would discuss her lab results at her next visit unless there is a critical issue that needs to be addressed sooner. Aleyza agreed to keep her next visit at the agreed upon time to discuss these results.  I, Doreene Nest, am acting as scribe for Dennard Nip, MD  I have reviewed the above documentation for accuracy and completeness, and I agree with the above. -Dennard Nip, MD

## 2016-06-26 LAB — COMPREHENSIVE METABOLIC PANEL
ALBUMIN: 4 g/dL (ref 3.5–5.5)
ALT: 14 IU/L (ref 0–32)
AST: 14 IU/L (ref 0–40)
Albumin/Globulin Ratio: 1.7 (ref 1.2–2.2)
Alkaline Phosphatase: 65 IU/L (ref 39–117)
BUN / CREAT RATIO: 19 (ref 9–23)
BUN: 13 mg/dL (ref 6–24)
Bilirubin Total: 0.2 mg/dL (ref 0.0–1.2)
CO2: 26 mmol/L (ref 18–29)
Calcium: 9 mg/dL (ref 8.7–10.2)
Chloride: 102 mmol/L (ref 96–106)
Creatinine, Ser: 0.68 mg/dL (ref 0.57–1.00)
GFR calc non Af Amer: 104 mL/min/{1.73_m2} (ref >59)
GFR, EST AFRICAN AMERICAN: 120 mL/min/{1.73_m2} (ref >59)
GLUCOSE: 85 mg/dL (ref 65–99)
Globulin, Total: 2.3 g/dL (ref 1.5–4.5)
Potassium: 4.5 mmol/L (ref 3.5–5.2)
Sodium: 140 mmol/L (ref 134–144)
Total Protein: 6.3 g/dL (ref 6.0–8.5)

## 2016-06-26 LAB — CBC WITH DIFFERENTIAL
BASOS ABS: 0 10*3/uL (ref 0.0–0.2)
Basos: 0 %
EOS (ABSOLUTE): 0.2 10*3/uL (ref 0.0–0.4)
EOS: 2 %
HEMATOCRIT: 37.1 % (ref 34.0–46.6)
HEMOGLOBIN: 12.2 g/dL (ref 11.1–15.9)
IMMATURE GRANS (ABS): 0 10*3/uL (ref 0.0–0.1)
IMMATURE GRANULOCYTES: 0 %
LYMPHS: 30 %
Lymphocytes Absolute: 2.4 10*3/uL (ref 0.7–3.1)
MCH: 29.4 pg (ref 26.6–33.0)
MCHC: 32.9 g/dL (ref 31.5–35.7)
MCV: 89 fL (ref 79–97)
MONOCYTES: 4 %
Monocytes Absolute: 0.4 10*3/uL (ref 0.1–0.9)
NEUTROS ABS: 5.3 10*3/uL (ref 1.4–7.0)
Neutrophils: 64 %
RBC: 4.15 x10E6/uL (ref 3.77–5.28)
RDW: 14.9 % (ref 12.3–15.4)
WBC: 8.3 10*3/uL (ref 3.4–10.8)

## 2016-06-26 LAB — HEMOGLOBIN A1C
Est. average glucose Bld gHb Est-mCnc: 117 mg/dL
Hgb A1c MFr Bld: 5.7 % — ABNORMAL HIGH (ref 4.8–5.6)

## 2016-06-26 LAB — LIPID PANEL WITH LDL/HDL RATIO
CHOLESTEROL TOTAL: 172 mg/dL (ref 100–199)
HDL: 47 mg/dL (ref >39)
LDL Calculated: 108 mg/dL — ABNORMAL HIGH (ref 0–99)
LDl/HDL Ratio: 2.3 ratio (ref 0.0–3.2)
Triglycerides: 85 mg/dL (ref 0–149)
VLDL CHOLESTEROL CAL: 17 mg/dL (ref 5–40)

## 2016-06-26 LAB — VITAMIN B12: Vitamin B-12: 397 pg/mL (ref 232–1245)

## 2016-06-26 LAB — VITAMIN D 25 HYDROXY (VIT D DEFICIENCY, FRACTURES): Vit D, 25-Hydroxy: 33.2 ng/mL (ref 30.0–100.0)

## 2016-06-26 LAB — T4, FREE: Free T4: 0.94 ng/dL (ref 0.82–1.77)

## 2016-06-26 LAB — FOLATE: Folate: 14.9 ng/mL (ref >3.0)

## 2016-06-26 LAB — INSULIN, RANDOM: INSULIN: 12.7 u[IU]/mL (ref 2.6–24.9)

## 2016-06-26 LAB — TSH: TSH: 1.63 u[IU]/mL (ref 0.450–4.500)

## 2016-06-26 LAB — T3: T3, Total: 197 ng/dL — ABNORMAL HIGH (ref 71–180)

## 2016-06-29 ENCOUNTER — Encounter (INDEPENDENT_AMBULATORY_CARE_PROVIDER_SITE_OTHER): Payer: Self-pay | Admitting: Family Medicine

## 2016-07-09 ENCOUNTER — Ambulatory Visit (INDEPENDENT_AMBULATORY_CARE_PROVIDER_SITE_OTHER): Payer: BC Managed Care – PPO | Admitting: Family Medicine

## 2016-07-09 VITALS — BP 116/77 | HR 68 | Temp 98.2°F | Ht 62.0 in | Wt 213.0 lb

## 2016-07-09 DIAGNOSIS — R7303 Prediabetes: Secondary | ICD-10-CM

## 2016-07-09 DIAGNOSIS — E669 Obesity, unspecified: Secondary | ICD-10-CM

## 2016-07-09 DIAGNOSIS — Z9189 Other specified personal risk factors, not elsewhere classified: Secondary | ICD-10-CM | POA: Diagnosis not present

## 2016-07-09 DIAGNOSIS — E559 Vitamin D deficiency, unspecified: Secondary | ICD-10-CM | POA: Diagnosis not present

## 2016-07-09 DIAGNOSIS — E782 Mixed hyperlipidemia: Secondary | ICD-10-CM

## 2016-07-09 DIAGNOSIS — Z6839 Body mass index (BMI) 39.0-39.9, adult: Secondary | ICD-10-CM

## 2016-07-09 DIAGNOSIS — E7849 Other hyperlipidemia: Secondary | ICD-10-CM | POA: Insufficient documentation

## 2016-07-09 MED ORDER — VITAMIN D (ERGOCALCIFEROL) 1.25 MG (50000 UNIT) PO CAPS: 50000.0000 [IU] | ORAL_CAPSULE | ORAL | 0 refills | Status: DC

## 2016-07-09 MED ORDER — METFORMIN HCL 500 MG PO TABS: 500.0000 mg | ORAL_TABLET | Freq: Every day | ORAL | 0 refills | Status: DC

## 2016-07-10 NOTE — Progress Notes (Signed)
Office: [phone]  /  Fax: [phone]   HPI:   Chief Complaint: OBESITY Grace Horton is here to discuss her progress with her obesity treatment plan. She is on the  follow our protein rich vegetarian plan and is following her eating plan approximately 90 % of the time. She states she is exercising 0 minutes 0 times per week. Tezra has done well with weight los on the vegetarian plan. She had increased travelling and increased eating out on the weekends but worked on making better choices. Her weight is 213 lb (96.6 kg) today and has had a weight loss of 6 pounds over a period of 2 weeks since her last visit. She has lost 6 lbs since starting treatment with [name].  Vitamin D deficiency Grace Horton has a new diagnosis of vitamin D deficiency. She is not currently taking vit D. She admits fatigue and denies nausea, vomiting or muscle weakness.  Pre-Diabetes Grace Horton has a new diagnosis of pre-diabetes based on her elevated Hgb A1c at 5.7 and insulin >5, she was informed this puts her at greater risk of developing diabetes. She is not taking metformin currently and continues to work on diet and exercise to decrease risk of diabetes. She admits polyphagia and denies nausea or hypoglycemia.  At risk for diabetes Grace Horton is at higher than average risk for developing diabetes due to her obesity and pre-diabetes. She currently denies polyuria or polydipsia.  Mixed Hyperlipidemia Grace Horton has hyperlipidemia, slightly elevated LDL at 108 and HDL < 50, triglycerides are within normal limits. She has been trying to improve her cholesterol levels with intensive lifestyle modification including a low saturated fat diet, exercise and weight loss. She denies any chest pain, claudication or myalgias.   ALLERGIES: No Known Allergies  MEDICATIONS: Current Outpatient Prescriptions on File Prior to Visit  Medication Sig Dispense Refill  . Coenzyme Q10 10 MG capsule Take 100 mg by mouth daily.    . Diclofenac Sodium  (PENNSAID) 2 % SOLN Fingertip amount to affected area twice daily 1 Bottle 2  . hydrochlorothiazide (HYDRODIURIL) 25 MG tablet Take 25 mg by mouth daily.    . magnesium 30 MG tablet Take 1,250 mg by mouth at bedtime.    . Turmeric 450 MG CAPS Take by mouth at bedtime.    Marland Kitchen ethynodiol-ethinyl estradiol (KELNOR,ZOVIA) 1-35 MG-MCG tablet Take 1 tablet by mouth daily.     No current facility-administered medications on file prior to visit.     PAST MEDICAL HISTORY: Past Medical History:  Diagnosis Date  . AC (acromioclavicular) joint bone spurs   . Anxiety   . Back pain   . Chondromalacia   . Depression   . GERD (gastroesophageal reflux disease)   . History of shingles 2011   Around Left Eye  . Joint pain   . Raynaud disease   . Swelling    in feet or legs  . Vitamin D deficiency     PAST SURGICAL HISTORY: Past Surgical History:  Procedure Laterality Date  . CESAREAN SECTION    . SHOULDER SURGERY Left 2007  . TONSILLECTOMY      SOCIAL HISTORY: Social History  Substance Use Topics  . Smoking status: Never Smoker  . Smokeless tobacco: Never Used  . Alcohol use No    FAMILY HISTORY: Family History  Problem Relation Age of Onset  . Heart disease Father   . Diabetes Father   . Kidney disease Father        Stage III   .  Transient ischemic attack Father        S/P Carotid endarterectomy  . Hypertension Father   . Hyperlipidemia Father   . Irritable bowel syndrome Mother   . Hypertension Mother   . Hyperlipidemia Mother   . Obesity Mother     ROS: Review of Systems  Constitutional: Positive for malaise/fatigue and weight loss.  Cardiovascular: Negative for chest pain and claudication.  Gastrointestinal: Negative for nausea and vomiting.  Genitourinary: Negative for frequency.  Musculoskeletal: Negative for myalgias.       Negative muscle weakness  Endo/Heme/Allergies: Negative for polydipsia.       Polyphagia Negative hypoglycemia    PHYSICAL EXAM: Blood  pressure 116/77, pulse 68, temperature 98.2 F (36.8 C), temperature source Oral, height 5\' 2"  (1.575 m), weight 213 lb (96.6 kg), SpO2 98 %. Body mass index is 38.96 kg/m. Physical Exam  RECENT LABS AND TESTS: BMET    Component Value Date/Time   NA 140 06/25/2016 1053   K 4.5 06/25/2016 1053   CL 102 06/25/2016 1053   CO2 26 06/25/2016 1053   GLUCOSE 85 06/25/2016 1053   GLUCOSE 83 05/27/2012 1119   BUN 13 06/25/2016 1053   CREATININE 0.68 06/25/2016 1053   CREATININE 0.81 05/27/2012 1119   CALCIUM 9.0 06/25/2016 1053   GFRNONAA 104 06/25/2016 1053   GFRAA 120 06/25/2016 1053   Lab Results  Component Value Date   HGBA1C 5.7 (H) 06/25/2016   Lab Results  Component Value Date   INSULIN 12.7 06/25/2016   CBC    Component Value Date/Time   WBC 8.3 06/25/2016 1053   WBC 6.7 05/27/2012 1119   RBC 4.15 06/25/2016 1053   RBC 4.46 05/27/2012 1119   HGB 13.7 05/27/2012 1119   HCT 37.1 06/25/2016 1053   PLT 317 05/27/2012 1119   MCV 89 06/25/2016 1053   MCH 29.4 06/25/2016 1053   MCH 30.7 05/27/2012 1119   MCHC 32.9 06/25/2016 1053   MCHC 33.7 05/27/2012 1119   RDW 14.9 06/25/2016 1053   LYMPHSABS 2.4 06/25/2016 1053   MONOABS 0.4 05/27/2012 1119   EOSABS 0.2 06/25/2016 1053   BASOSABS 0.0 06/25/2016 1053   Iron/TIBC/Ferritin/ %Sat No results found for: IRON, TIBC, FERRITIN, IRONPCTSAT Lipid Panel     Component Value Date/Time   CHOL 172 06/25/2016 1053   TRIG 85 06/25/2016 1053   HDL 47 06/25/2016 1053   LDLCALC 108 (H) 06/25/2016 1053   Hepatic Function Panel     Component Value Date/Time   PROT 6.3 06/25/2016 1053   ALBUMIN 4.0 06/25/2016 1053   AST 14 06/25/2016 1053   ALT 14 06/25/2016 1053   ALKPHOS 65 06/25/2016 1053   BILITOT 0.2 06/25/2016 1053      Component Value Date/Time   TSH 1.630 06/25/2016 1053    ASSESSMENT AND PLAN: Mixed hyperlipidemia  Prediabetes - Plan: metFORMIN (GLUCOPHAGE) 500 MG tablet  Vitamin D deficiency - Plan:  Vitamin D, Ergocalciferol, (DRISDOL) 50000 units CAPS capsule  At risk for diabetes mellitus  Class 2 obesity without serious comorbidity with body mass index (BMI) of 39.0 to 39.9 in adult, unspecified obesity type  PLAN: Vitamin D Deficiency Grace Horton was informed that low vitamin D levels contributes to fatigue and are associated with obesity, breast, and colon cancer. She agrees to start to take prescription Vit D @50 ,000 IU every week #4 with no refills and will follow up for routine testing of vitamin D, at least 2-3 times per year. She was informed of  the risk of over-replacement of vitamin D and agrees to not increase her dose unless he discusses this with [name] first.  Pre-Diabetes Grace Horton will continue to work on weight loss, exercise, and decreasing simple carbohydrates in her diet to help decrease the risk of diabetes. We dicussed metformin including benefits and risks. She was informed that eating too many simple carbohydrates or too many calories at one sitting increases the likelihood of GI side effects. Grace Horton requested metformin for now and a prescription was written today for metformin 500 mg every morning #30 with no refills. Grace Horton agreed to follow up with [name] as directed to monitor her progress.  Diabetes risk counselling Grace Horton was given extended (at least 30 minutes) diabetes prevention counseling today. She is 46 y.o. female and has risk factors for diabetes including obesity and pre-diabetes. We discussed intensive lifestyle modifications today with an emphasis on weight loss as well as increasing exercise and decreasing simple carbohydrates in her diet.  Mixed Hyperlipidemia Grace Horton was informed of the American Heart Association Guidelines emphasizing intensive lifestyle modifications as the first line treatment for hyperlipidemia. We discussed many lifestyle modifications today in depth, and Grace Horton will continue to work on decreasing saturated fats such as fatty red meat, butter  and many fried foods. She will also increase vegetables and lean protein in her diet and continue to work on exercise and weight loss efforts.  Obesity Grace Horton is currently in the action stage of change. As such, her goal is to continue with weight loss efforts She has agreed to keep a food journal with 1100 to 1300 calories and 70+ grams of protein  Grace Horton has been instructed to work up to a goal of 150 minutes of combined cardio and strengthening exercise per week for weight loss and overall health benefits. We discussed the following Behavioral Modification Strategies today: increasing lean protein intake and increasing fiber rich foods  Grace Horton has agreed to follow up with our clinic in 2 weeks. She was informed of the importance of frequent follow up visits to maximize her success with intensive lifestyle modifications for her multiple health conditions.  I, Doreene Nest, am acting as scribe for Dennard Nip, MD  I have reviewed the above documentation for accuracy and completeness, and I agree with the above. -Dennard Nip, MD  OBESITY BEHAVIORAL INTERVENTION VISIT  Today's visit was # 2 out of 63.  Starting weight: 219 lbs Starting date: 06/25/16 Today's weight : 213 lbs  Today's date: 07/09/2016 Total lbs lost to date: 6 (Patients must lose 7 lbs in the first 6 months to continue with counseling)   ASK: We discussed the diagnosis of obesity with Grace Horton today and Grace Horton agreed to give [name] permission to discuss obesity behavioral modification therapy today.  ASSESS: Grace Horton has the diagnosis of obesity and her BMI today is 33 Grace Horton is in the action stage of change   ADVISE: Grace Horton was educated on the multiple health risks of obesity as well as the benefit of weight loss to improve her health. She was advised of the need for long term treatment and the importance of lifestyle modifications.  AGREE: Multiple dietary modification options and treatment options were  discussed and  Grace Horton agreed to keep a food journal with 1100 to 1300 calories and 70+ grams of protein  We discussed the following Behavioral Modification Strategies today: increasing lean protein intake and increasing fiber rich foods

## 2016-07-23 ENCOUNTER — Ambulatory Visit (INDEPENDENT_AMBULATORY_CARE_PROVIDER_SITE_OTHER): Payer: BC Managed Care – PPO | Admitting: Family Medicine

## 2016-07-23 VITALS — BP 114/77 | HR 69 | Temp 97.9°F | Ht 62.0 in | Wt 212.0 lb

## 2016-07-23 DIAGNOSIS — Z6838 Body mass index (BMI) 38.0-38.9, adult: Secondary | ICD-10-CM | POA: Diagnosis not present

## 2016-07-23 DIAGNOSIS — R7303 Prediabetes: Secondary | ICD-10-CM | POA: Diagnosis not present

## 2016-07-23 DIAGNOSIS — E669 Obesity, unspecified: Secondary | ICD-10-CM

## 2016-07-23 DIAGNOSIS — E66812 Obesity, class 2: Secondary | ICD-10-CM | POA: Insufficient documentation

## 2016-07-23 DIAGNOSIS — E559 Vitamin D deficiency, unspecified: Secondary | ICD-10-CM

## 2016-07-23 MED ORDER — METFORMIN HCL 500 MG PO TABS: 500.0000 mg | ORAL_TABLET | Freq: Every day | ORAL | 0 refills | Status: DC

## 2016-07-23 MED ORDER — VITAMIN D (ERGOCALCIFEROL) 1.25 MG (50000 UNIT) PO CAPS: 50000.0000 [IU] | ORAL_CAPSULE | ORAL | 0 refills | Status: DC

## 2016-07-29 NOTE — Progress Notes (Signed)
Office: [phone]  /  Fax: [phone]   HPI:   Chief Complaint: OBESITY Grace Horton is here to discuss her progress with her obesity treatment plan. She is on the  keep a food journal with 1100 to 1300 calories and 70+ grams of protein daily and is following her eating plan approximately 90 to 95 % of the time. She states she is exercising 0 minutes 0 times per week. Grace Horton continues to do well with weight loss. She is planning ahead well and is keeping a good food journal. She denies hunger. Her weight is 212 lb (96.2 kg) today and has had a weight loss of 1 pound over a period of 2 weeks since her last visit. She has lost 1 lb since starting treatment with [name].  Vitamin D deficiency Grace Horton has a diagnosis of vitamin D deficiency. She is currently taking vit D and denies nausea, vomiting or muscle weakness.  Pre-Diabetes Grace Horton has a diagnosis of pre-diabetes based on her elevated Hgb A1c and was informed this puts her at greater risk of developing diabetes. She is taking metformin currently and continues to work on diet and exercise to decrease risk of diabetes. She denies nausea, polyphagia or hypoglycemia.   ALLERGIES: No Known Allergies  MEDICATIONS: Current Outpatient Prescriptions on File Prior to Visit  Medication Sig Dispense Refill  . Coenzyme Q10 10 MG capsule Take 100 mg by mouth daily.    . Diclofenac Sodium (PENNSAID) 2 % SOLN Fingertip amount to affected area twice daily 1 Bottle 2  . ethynodiol-ethinyl estradiol (KELNOR,ZOVIA) 1-35 MG-MCG tablet Take 1 tablet by mouth daily.    . hydrochlorothiazide (HYDRODIURIL) 25 MG tablet Take 25 mg by mouth daily.    . magnesium 30 MG tablet Take 1,250 mg by mouth at bedtime.    . Turmeric 450 MG CAPS Take by mouth at bedtime.     No current facility-administered medications on file prior to visit.     PAST MEDICAL HISTORY: Past Medical History:  Diagnosis Date  . AC (acromioclavicular) joint bone spurs   . Anxiety   .  Back pain   . Chondromalacia   . Depression   . GERD (gastroesophageal reflux disease)   . History of shingles 2011   Around Left Eye  . Joint pain   . Raynaud disease   . Swelling    in feet or legs  . Vitamin D deficiency     PAST SURGICAL HISTORY: Past Surgical History:  Procedure Laterality Date  . CESAREAN SECTION    . SHOULDER SURGERY Left 2007  . TONSILLECTOMY      SOCIAL HISTORY: Social History  Substance Use Topics  . Smoking status: Never Smoker  . Smokeless tobacco: Never Used  . Alcohol use No    FAMILY HISTORY: Family History  Problem Relation Age of Onset  . Heart disease Father   . Diabetes Father   . Kidney disease Father        Stage III   . Transient ischemic attack Father        S/P Carotid endarterectomy  . Hypertension Father   . Hyperlipidemia Father   . Irritable bowel syndrome Mother   . Hypertension Mother   . Hyperlipidemia Mother   . Obesity Mother     ROS: Review of Systems  Constitutional: Positive for weight loss.  Gastrointestinal: Negative for nausea and vomiting.  Musculoskeletal:       Negative muscle weakness  Endo/Heme/Allergies:       Negative  polyphagia Negative hypoglycemia    PHYSICAL EXAM: Blood pressure 114/77, pulse 69, temperature 97.9 F (36.6 C), temperature source Oral, height 5\' 2"  (1.575 m), weight 212 lb (96.2 kg), SpO2 98 %. Body mass index is 38.78 kg/m. Physical Exam  Constitutional: She is oriented to person, place, and time. She appears well-developed and well-nourished.  Cardiovascular: Normal rate.   Pulmonary/Chest: Effort normal.  Musculoskeletal: Normal range of motion.  Neurological: She is oriented to person, place, and time.  Skin: Skin is warm and dry.  Psychiatric: She has a normal mood and affect. Her behavior is normal.  Vitals reviewed.   RECENT LABS AND TESTS: BMET    Component Value Date/Time   NA 140 06/25/2016 1053   K 4.5 06/25/2016 1053   CL 102 06/25/2016 1053    CO2 26 06/25/2016 1053   GLUCOSE 85 06/25/2016 1053   GLUCOSE 83 05/27/2012 1119   BUN 13 06/25/2016 1053   CREATININE 0.68 06/25/2016 1053   CREATININE 0.81 05/27/2012 1119   CALCIUM 9.0 06/25/2016 1053   GFRNONAA 104 06/25/2016 1053   GFRAA 120 06/25/2016 1053   Lab Results  Component Value Date   HGBA1C 5.7 (H) 06/25/2016   Lab Results  Component Value Date   INSULIN 12.7 06/25/2016   CBC    Component Value Date/Time   WBC 8.3 06/25/2016 1053   WBC 6.7 05/27/2012 1119   RBC 4.15 06/25/2016 1053   RBC 4.46 05/27/2012 1119   HGB 12.2 06/25/2016 1053   HCT 37.1 06/25/2016 1053   PLT 317 05/27/2012 1119   MCV 89 06/25/2016 1053   MCH 29.4 06/25/2016 1053   MCH 30.7 05/27/2012 1119   MCHC 32.9 06/25/2016 1053   MCHC 33.7 05/27/2012 1119   RDW 14.9 06/25/2016 1053   LYMPHSABS 2.4 06/25/2016 1053   MONOABS 0.4 05/27/2012 1119   EOSABS 0.2 06/25/2016 1053   BASOSABS 0.0 06/25/2016 1053   Iron/TIBC/Ferritin/ %Sat No results found for: IRON, TIBC, FERRITIN, IRONPCTSAT Lipid Panel     Component Value Date/Time   CHOL 172 06/25/2016 1053   TRIG 85 06/25/2016 1053   HDL 47 06/25/2016 1053   LDLCALC 108 (H) 06/25/2016 1053   Hepatic Function Panel     Component Value Date/Time   PROT 6.3 06/25/2016 1053   ALBUMIN 4.0 06/25/2016 1053   AST 14 06/25/2016 1053   ALT 14 06/25/2016 1053   ALKPHOS 65 06/25/2016 1053   BILITOT 0.2 06/25/2016 1053      Component Value Date/Time   TSH 1.630 06/25/2016 1053    ASSESSMENT AND PLAN: Vitamin D deficiency - Plan: Vitamin D, Ergocalciferol, (DRISDOL) 50000 units CAPS capsule  Prediabetes - Plan: metFORMIN (GLUCOPHAGE) 500 MG tablet  Class 2 obesity without serious comorbidity with body mass index (BMI) of 38.0 to 38.9 in adult, unspecified obesity type  PLAN:  Vitamin D Deficiency Grace Horton was informed that low vitamin D levels contributes to fatigue and are associated with obesity, breast, and colon cancer. She agrees  to continue to take prescription Vit D @50 ,000 IU every week, we will refill for 1 month and will follow up for routine testing of vitamin D, at least 2-3 times per year. She was informed of the risk of over-replacement of vitamin D and agrees to not increase her dose unless he discusses this with [name] first. Grace Horton agrees to follow up with our clinic in 2 to 3 weeks.  Pre-Diabetes Grace Horton will continue to work on weight loss, exercise, and decreasing simple  carbohydrates in her diet to help decrease the risk of diabetes. We dicussed metformin including benefits and risks. She was informed that eating too many simple carbohydrates or too many calories at one sitting increases the likelihood of GI side effects. Grace Horton agrees to continue to take metformin for now and a prescription was written today for 1 month refill. Grace Horton agreed to follow up with [name] as directed to monitor her progress.  Obesity Grace Horton is currently in the action stage of change. As such, her goal is to continue with weight loss efforts She has agreed to keep a food journal with 1100 to 1300 calories and 70 grams of protein daily Grace Horton has been instructed to work up to a goal of 150 minutes of combined cardio and strengthening exercise per week for weight loss and overall health benefits. We discussed the following Behavioral Modification Strategies today: increasing lean protein intake and increase H2O intake  Grace Horton has agreed to follow up with our clinic in 2 to 3 weeks. She was informed of the importance of frequent follow up visits to maximize her success with intensive lifestyle modifications for her multiple health conditions.  I, Doreene Nest, am acting as transcriptionist for Dennard Nip, MD  I have reviewed the above documentation for accuracy and completeness, and I agree with the above. -Dennard Nip, MD  OBESITY BEHAVIORAL INTERVENTION VISIT  Today's visit was # 3 out of 22.  Starting weight: 219 lbs Starting  date: 06/25/16 Today's weight : 212 lbs  Today's date: 07/23/2016 Total lbs lost to date: 7 (Patients must lose 7 lbs in the first 6 months to continue with counseling)   ASK: We discussed the diagnosis of obesity with Grace Horton today and Grace Horton agreed to give [name] permission to discuss obesity behavioral modification therapy today.  ASSESS: Grace Horton has the diagnosis of obesity and her BMI today is 38.9 Grace Horton is in the action stage of change   ADVISE: Grace Horton was educated on the multiple health risks of obesity as well as the benefit of weight loss to improve her health. She was advised of the need for long term treatment and the importance of lifestyle modifications.  AGREE: Multiple dietary modification options and treatment options were discussed and  Grace Horton agreed to keep a food journal with 1100 to 1300 calories and 70 grams of protein daily We discussed the following Behavioral Modification Strategies today: increasing lean protein intake and increase H2O intake

## 2016-08-07 ENCOUNTER — Telehealth (INDEPENDENT_AMBULATORY_CARE_PROVIDER_SITE_OTHER): Payer: Self-pay | Admitting: Family Medicine

## 2016-08-07 NOTE — Telephone Encounter (Signed)
[facility] called for Vit D Refill . [phone]

## 2016-08-07 NOTE — Telephone Encounter (Signed)
We will send a refill for her on Monday...  Thank you!

## 2016-08-11 ENCOUNTER — Ambulatory Visit (INDEPENDENT_AMBULATORY_CARE_PROVIDER_SITE_OTHER): Payer: BC Managed Care – PPO | Admitting: Family Medicine

## 2016-08-11 VITALS — BP 120/80 | HR 68 | Temp 94.7°F | Ht 62.0 in | Wt 209.0 lb

## 2016-08-11 DIAGNOSIS — E669 Obesity, unspecified: Secondary | ICD-10-CM | POA: Diagnosis not present

## 2016-08-11 DIAGNOSIS — R7303 Prediabetes: Secondary | ICD-10-CM | POA: Diagnosis not present

## 2016-08-11 DIAGNOSIS — E559 Vitamin D deficiency, unspecified: Secondary | ICD-10-CM

## 2016-08-11 DIAGNOSIS — Z6838 Body mass index (BMI) 38.0-38.9, adult: Secondary | ICD-10-CM

## 2016-08-11 DIAGNOSIS — IMO0001 Reserved for inherently not codable concepts without codable children: Secondary | ICD-10-CM

## 2016-08-11 MED ORDER — VITAMIN D (ERGOCALCIFEROL) 1.25 MG (50000 UNIT) PO CAPS: 50000.0000 [IU] | ORAL_CAPSULE | ORAL | 0 refills | Status: DC

## 2016-08-11 NOTE — Progress Notes (Signed)
Office: [phone]  /  Fax: [phone]   HPI:   Chief Complaint: OBESITY Grace Horton is here to discuss her progress with her obesity treatment plan. She is on the  keep a food journal with 1100 to 1300 calories and 70 grams of protein  and is following her eating plan approximately 80 % of the time. She states she is exercising 0 minutes 0 times per week. Grace Horton continues to do well with weight loss. She is planning ahead well and is making smart choices/portion control. Her weight is 209 lb (94.8 kg) today and has had a weight loss of 3 pounds over a period of 3 weeks since her last visit. She has lost 10 lbs since starting treatment with [name].  Vitamin D deficiency Grace Horton has a diagnosis of vitamin D deficiency. She is currently taking vit D and denies nausea, vomiting or muscle weakness.  Pre-Diabetes Grace Horton has a diagnosis of pre-diabetes based on her elevated Hgb A1c and was informed this puts her at greater risk of developing diabetes. She is taking metformin currently and continues to work on diet and exercise to decrease risk of diabetes. She admits polyphagia on 2 separate occasions and denies nausea or hypoglycemia.   ALLERGIES: No Known Allergies  MEDICATIONS: Current Outpatient Prescriptions on File Prior to Visit  Medication Sig Dispense Refill  . Coenzyme Q10 10 MG capsule Take 100 mg by mouth daily.    . Diclofenac Sodium (PENNSAID) 2 % SOLN Fingertip amount to affected area twice daily 1 Bottle 2  . ethynodiol-ethinyl estradiol (KELNOR,ZOVIA) 1-35 MG-MCG tablet Take 1 tablet by mouth daily.    . hydrochlorothiazide (HYDRODIURIL) 25 MG tablet Take 25 mg by mouth daily.    . magnesium 30 MG tablet Take 1,250 mg by mouth at bedtime.    . metFORMIN (GLUCOPHAGE) 500 MG tablet Take 1 tablet (500 mg total) by mouth daily. 30 tablet 0  . Turmeric 450 MG CAPS Take by mouth at bedtime.     No current facility-administered medications on file prior to visit.     PAST MEDICAL  HISTORY: Past Medical History:  Diagnosis Date  . AC (acromioclavicular) joint bone spurs   . Anxiety   . Back pain   . Chondromalacia   . Depression   . GERD (gastroesophageal reflux disease)   . History of shingles 2011   Around Left Eye  . Joint pain   . Raynaud disease   . Swelling    in feet or legs  . Vitamin D deficiency     PAST SURGICAL HISTORY: Past Surgical History:  Procedure Laterality Date  . CESAREAN SECTION    . SHOULDER SURGERY Left 2007  . TONSILLECTOMY      SOCIAL HISTORY: Social History  Substance Use Topics  . Smoking status: Never Smoker  . Smokeless tobacco: Never Used  . Alcohol use No    FAMILY HISTORY: Family History  Problem Relation Age of Onset  . Heart disease Father   . Diabetes Father   . Kidney disease Father        Stage III   . Transient ischemic attack Father        S/P Carotid endarterectomy  . Hypertension Father   . Hyperlipidemia Father   . Irritable bowel syndrome Mother   . Hypertension Mother   . Hyperlipidemia Mother   . Obesity Mother     ROS: Review of Systems  Constitutional: Positive for weight loss.  Gastrointestinal: Negative for nausea and vomiting.  Musculoskeletal:       Negative muscle weakness  Endo/Heme/Allergies:       Polyphagia Negative hypoglycemia    PHYSICAL EXAM: Blood pressure 120/80, pulse 68, temperature (!) 94.7 F (34.8 C), temperature source Oral, height 5\' 2"  (1.575 m), weight 209 lb (94.8 kg), SpO2 100 %. Body mass index is 38.23 kg/m. Physical Exam  Constitutional: She is oriented to person, place, and time. She appears well-developed and well-nourished.  Cardiovascular: Normal rate.   Pulmonary/Chest: Effort normal.  Musculoskeletal: Normal range of motion.  Neurological: She is oriented to person, place, and time.  Skin: Skin is warm and dry.  Psychiatric: She has a normal mood and affect. Her behavior is normal.  Vitals reviewed.   RECENT LABS AND TESTS: BMET      Component Value Date/Time   NA 140 06/25/2016 1053   K 4.5 06/25/2016 1053   CL 102 06/25/2016 1053   CO2 26 06/25/2016 1053   GLUCOSE 85 06/25/2016 1053   GLUCOSE 83 05/27/2012 1119   BUN 13 06/25/2016 1053   CREATININE 0.68 06/25/2016 1053   CREATININE 0.81 05/27/2012 1119   CALCIUM 9.0 06/25/2016 1053   GFRNONAA 104 06/25/2016 1053   GFRAA 120 06/25/2016 1053   Lab Results  Component Value Date   HGBA1C 5.7 (H) 06/25/2016   Lab Results  Component Value Date   INSULIN 12.7 06/25/2016   CBC    Component Value Date/Time   WBC 8.3 06/25/2016 1053   WBC 6.7 05/27/2012 1119   RBC 4.15 06/25/2016 1053   RBC 4.46 05/27/2012 1119   HGB 12.2 06/25/2016 1053   HCT 37.1 06/25/2016 1053   PLT 317 05/27/2012 1119   MCV 89 06/25/2016 1053   MCH 29.4 06/25/2016 1053   MCH 30.7 05/27/2012 1119   MCHC 32.9 06/25/2016 1053   MCHC 33.7 05/27/2012 1119   RDW 14.9 06/25/2016 1053   LYMPHSABS 2.4 06/25/2016 1053   MONOABS 0.4 05/27/2012 1119   EOSABS 0.2 06/25/2016 1053   BASOSABS 0.0 06/25/2016 1053   Iron/TIBC/Ferritin/ %Sat No results found for: IRON, TIBC, FERRITIN, IRONPCTSAT Lipid Panel     Component Value Date/Time   CHOL 172 06/25/2016 1053   TRIG 85 06/25/2016 1053   HDL 47 06/25/2016 1053   LDLCALC 108 (H) 06/25/2016 1053   Hepatic Function Panel     Component Value Date/Time   PROT 6.3 06/25/2016 1053   ALBUMIN 4.0 06/25/2016 1053   AST 14 06/25/2016 1053   ALT 14 06/25/2016 1053   ALKPHOS 65 06/25/2016 1053   BILITOT 0.2 06/25/2016 1053      Component Value Date/Time   TSH 1.630 06/25/2016 1053    ASSESSMENT AND PLAN: Vitamin D deficiency - Plan: Vitamin D, Ergocalciferol, (DRISDOL) 50000 units CAPS capsule  Prediabetes  Class 2 obesity with serious comorbidity and body mass index (BMI) of 38.0 to 38.9 in adult, unspecified obesity type  PLAN:  Vitamin D Deficiency Grace Horton was informed that low vitamin D levels contributes to fatigue and are  associated with obesity, breast, and colon cancer. She agrees to continue to take prescription Vit D @50 ,000 IU every week, we will refill for 1 month and will follow up for routine testing of vitamin D, at least 2-3 times per year. She was informed of the risk of over-replacement of vitamin D and agrees to not increase her dose unless he discusses this with [name] first. Cariana agrees to follow up with our clinic in 2 weeks.  Pre-Diabetes Tianni  will continue to work on weight loss, exercise, and decreasing simple carbohydrates in her diet to help decrease the risk of diabetes. We dicussed metformin including benefits and risks. She was informed that eating too many simple carbohydrates or too many calories at one sitting increases the likelihood of GI side effects. Jalisia agrees to continue metformin for now and a prescription was not written today. Ajanee agreed to follow up with [name] as directed to monitor her progress.  Obesity Korbyn is currently in the action stage of change. As such, her goal is to continue with weight loss efforts She has agreed to keep a food journal with 1100 to 1300 calories and 70 grams of protein  Daysi has been instructed to work up to a goal of 150 minutes of combined cardio and strengthening exercise per week for weight loss and overall health benefits. We discussed the following Behavioral Modification Strategies today: increasing lean protein intake and better snacking choices  Destany has agreed to follow up with our clinic in 2 weeks. She was informed of the importance of frequent follow up visits to maximize her success with intensive lifestyle modifications for her multiple health conditions.  I, Doreene Nest, am acting as transcriptionist for Dennard Nip, MD  I have reviewed the above documentation for accuracy and completeness, and I agree with the above. -Dennard Nip, MD  OBESITY BEHAVIORAL INTERVENTION VISIT  Today's visit was # 4 out of 22.  Starting  weight: 219 lbs Starting date: 06/25/16 Today's weight : 209 lbs  Today's date: 08/11/2016 Total lbs lost to date: 10 (Patients must lose 7 lbs in the first 6 months to continue with counseling)   ASK: We discussed the diagnosis of obesity with Alben Spittle today and Mykaylah agreed to give [name] permission to discuss obesity behavioral modification therapy today.  ASSESS: Donnalee has the diagnosis of obesity and her BMI today is 38.3 Erza is in the action stage of change   ADVISE: Charman was educated on the multiple health risks of obesity as well as the benefit of weight loss to improve her health. She was advised of the need for long term treatment and the importance of lifestyle modifications.  AGREE: Multiple dietary modification options and treatment options were discussed and  Mylena agreed to keep a food journal with 1100 to 1300 calories and 70 grams of protein  We discussed the following Behavioral Modification Strategies today: increasing lean protein intake and better snacking choices

## 2016-08-26 ENCOUNTER — Ambulatory Visit (INDEPENDENT_AMBULATORY_CARE_PROVIDER_SITE_OTHER): Payer: BC Managed Care – PPO | Admitting: Family Medicine

## 2016-08-26 VITALS — BP 117/76 | HR 74 | Temp 98.5°F | Ht 62.0 in | Wt 205.0 lb

## 2016-08-26 DIAGNOSIS — Z6837 Body mass index (BMI) 37.0-37.9, adult: Secondary | ICD-10-CM | POA: Diagnosis not present

## 2016-08-26 DIAGNOSIS — E559 Vitamin D deficiency, unspecified: Secondary | ICD-10-CM

## 2016-08-26 DIAGNOSIS — E669 Obesity, unspecified: Secondary | ICD-10-CM

## 2016-08-26 DIAGNOSIS — Z6835 Body mass index (BMI) 35.0-35.9, adult: Secondary | ICD-10-CM

## 2016-08-26 DIAGNOSIS — R7303 Prediabetes: Secondary | ICD-10-CM

## 2016-08-26 DIAGNOSIS — IMO0001 Reserved for inherently not codable concepts without codable children: Secondary | ICD-10-CM

## 2016-08-26 MED ORDER — METFORMIN HCL 500 MG PO TABS: 500.0000 mg | ORAL_TABLET | Freq: Every day | ORAL | 0 refills | Status: DC

## 2016-08-26 MED ORDER — VITAMIN D (ERGOCALCIFEROL) 1.25 MG (50000 UNIT) PO CAPS: 50000.0000 [IU] | ORAL_CAPSULE | ORAL | 0 refills | Status: DC

## 2016-08-26 NOTE — Progress Notes (Signed)
Office: [phone]  /  Fax: [phone]   HPI:   Chief Complaint: OBESITY Grace Horton is here to discuss her progress with her obesity treatment plan. She is on the  keep a food journal with 1100 to 1300 calories and 70 grams of protein daily and is following her eating plan approximately 95 % of the time. She states she is walking/riding stationary bike/swimming for 30+ minutes 5+ times per week. Grace Horton continues to do well with weight loss. She is doing well with journaling and increasing lean protein. She is not skipping meals and is doing well planning meals ahead of time and making smart choices when eating out. She has started exercising most days of the week. Her weight is 205 lb (93 kg) today and has had a weight loss of 4 pounds over a period of 2 weeks since her last visit. She has lost 14 lbs since starting treatment with [name].  Vitamin D deficiency Grace Horton has a diagnosis of vitamin D deficiency. She is currently stable on vit D, not yet at goal. Fatigue is improved and she denies nausea, vomiting or muscle weakness.  Pre-Diabetes Grace Horton has a diagnosis of pre-diabetes based on her elevated Hgb A1c and was informed this puts her at greater risk of developing diabetes. She is stable on metformin and diet currently and is doing very well on diet prescription and exercise. She continues to work on diet and exercise to decrease risk of diabetes. She denies nausea, vomiting or hypoglycemia.   ALLERGIES: No Known Allergies  MEDICATIONS: Current Outpatient Prescriptions on File Prior to Visit  Medication Sig Dispense Refill  . Coenzyme Q10 10 MG capsule Take 100 mg by mouth daily.    . Diclofenac Sodium (PENNSAID) 2 % SOLN Fingertip amount to affected area twice daily 1 Bottle 2  . ethynodiol-ethinyl estradiol (KELNOR,ZOVIA) 1-35 MG-MCG tablet Take 1 tablet by mouth daily.    . hydrochlorothiazide (HYDRODIURIL) 25 MG tablet Take 25 mg by mouth daily.    . magnesium 30 MG tablet Take  1,250 mg by mouth at bedtime.    . Turmeric 450 MG CAPS Take by mouth at bedtime.     No current facility-administered medications on file prior to visit.     PAST MEDICAL HISTORY: Past Medical History:  Diagnosis Date  . AC (acromioclavicular) joint bone spurs   . Anxiety   . Back pain   . Chondromalacia   . Depression   . GERD (gastroesophageal reflux disease)   . History of shingles 2011   Around Left Eye  . Joint pain   . Raynaud disease   . Swelling    in feet or legs  . Vitamin D deficiency     PAST SURGICAL HISTORY: Past Surgical History:  Procedure Laterality Date  . CESAREAN SECTION    . SHOULDER SURGERY Left 2007  . TONSILLECTOMY      SOCIAL HISTORY: Social History  Substance Use Topics  . Smoking status: Never Smoker  . Smokeless tobacco: Never Used  . Alcohol use No    FAMILY HISTORY: Family History  Problem Relation Age of Onset  . Heart disease Father   . Diabetes Father   . Kidney disease Father        Stage III   . Transient ischemic attack Father        S/P Carotid endarterectomy  . Hypertension Father   . Hyperlipidemia Father   . Irritable bowel syndrome Mother   . Hypertension Mother   .  Hyperlipidemia Mother   . Obesity Mother     ROS: Review of Systems  Constitutional: Positive for malaise/fatigue and weight loss.  Gastrointestinal: Negative for nausea and vomiting.  Musculoskeletal:       Negative muscle weakness  Endo/Heme/Allergies:       Negative hypoglycemia    PHYSICAL EXAM: Blood pressure 117/76, pulse 74, temperature 98.5 F (36.9 C), temperature source Oral, height 5\' 2"  (1.575 m), weight 205 lb (93 kg), SpO2 98 %. Body mass index is 37.49 kg/m. Physical Exam  Constitutional: She is oriented to person, place, and time. She appears well-developed and well-nourished.  Cardiovascular: Normal rate.   Pulmonary/Chest: Effort normal.  Musculoskeletal: Normal range of motion.  Neurological: She is oriented to person,  place, and time.  Skin: Skin is warm and dry.  Psychiatric: She has a normal mood and affect. Her behavior is normal.  Vitals reviewed.   RECENT LABS AND TESTS: BMET    Component Value Date/Time   NA 140 06/25/2016 1053   K 4.5 06/25/2016 1053   CL 102 06/25/2016 1053   CO2 26 06/25/2016 1053   GLUCOSE 85 06/25/2016 1053   GLUCOSE 83 05/27/2012 1119   BUN 13 06/25/2016 1053   CREATININE 0.68 06/25/2016 1053   CREATININE 0.81 05/27/2012 1119   CALCIUM 9.0 06/25/2016 1053   GFRNONAA 104 06/25/2016 1053   GFRAA 120 06/25/2016 1053   Lab Results  Component Value Date   HGBA1C 5.7 (H) 06/25/2016   Lab Results  Component Value Date   INSULIN 12.7 06/25/2016   CBC    Component Value Date/Time   WBC 8.3 06/25/2016 1053   WBC 6.7 05/27/2012 1119   RBC 4.15 06/25/2016 1053   RBC 4.46 05/27/2012 1119   HGB 12.2 06/25/2016 1053   HCT 37.1 06/25/2016 1053   PLT 317 05/27/2012 1119   MCV 89 06/25/2016 1053   MCH 29.4 06/25/2016 1053   MCH 30.7 05/27/2012 1119   MCHC 32.9 06/25/2016 1053   MCHC 33.7 05/27/2012 1119   RDW 14.9 06/25/2016 1053   LYMPHSABS 2.4 06/25/2016 1053   MONOABS 0.4 05/27/2012 1119   EOSABS 0.2 06/25/2016 1053   BASOSABS 0.0 06/25/2016 1053   Iron/TIBC/Ferritin/ %Sat No results found for: IRON, TIBC, FERRITIN, IRONPCTSAT Lipid Panel     Component Value Date/Time   CHOL 172 06/25/2016 1053   TRIG 85 06/25/2016 1053   HDL 47 06/25/2016 1053   LDLCALC 108 (H) 06/25/2016 1053   Hepatic Function Panel     Component Value Date/Time   PROT 6.3 06/25/2016 1053   ALBUMIN 4.0 06/25/2016 1053   AST 14 06/25/2016 1053   ALT 14 06/25/2016 1053   ALKPHOS 65 06/25/2016 1053   BILITOT 0.2 06/25/2016 1053      Component Value Date/Time   TSH 1.630 06/25/2016 1053    ASSESSMENT AND PLAN: Vitamin D deficiency - Plan: Vitamin D, Ergocalciferol, (DRISDOL) 50000 units CAPS capsule  Prediabetes - Plan: metFORMIN (GLUCOPHAGE) 500 MG tablet  Class 2  obesity with serious comorbidity and body mass index (BMI) of 37.0 to 37.9 in adult, unspecified obesity type  PLAN:  Vitamin D Deficiency Grace Horton was informed that low vitamin D levels contributes to fatigue and are associated with obesity, breast, and colon cancer. She agrees to continue to take prescription Vit D @50 ,000 IU every week, we will refill for 1 month and will follow up for routine testing of vitamin D, at least 2-3 times per year. She was informed of  the risk of over-replacement of vitamin D and agrees to not increase her dose unless he discusses this with [name] first. Grace Horton agrees to follow up with our clinic in 2 weeks.  Pre-Diabetes Grace Horton will continue to work on weight loss, exercise, and decreasing simple carbohydrates in her diet to help decrease the risk of diabetes. We dicussed metformin including benefits and risks. She was informed that eating too many simple carbohydrates or too many calories at one sitting increases the likelihood of GI side effects. Grace Horton agrees to continue metformin for now and a prescription was written today for 1 month refill. Grace Horton agreed to follow up with [name] as directed to monitor her progress.  Obesity Grace Horton is currently in the action stage of change. As such, her goal is to continue with weight loss efforts She has agreed to keep a food journal with 1000 to 1200 calories and 70+ grams of protein daily Grace Horton has been instructed to work up to a goal of 150 minutes of combined cardio and strengthening exercise per week for weight loss and overall health benefits. We discussed the following Behavioral Modification Strategies today: increasing lean protein intake, decrease eating out and dealing with family or coworker sabotage  Grace Horton has agreed to follow up with our clinic in 2 weeks. She was informed of the importance of frequent follow up visits to maximize her success with intensive lifestyle modifications for her multiple health  conditions.  I, Doreene Nest, am acting as transcriptionist for Dennard Nip, MD  I have reviewed the above documentation for accuracy and completeness, and I agree with the above. -Dennard Nip, MD   OBESITY BEHAVIORAL INTERVENTION VISIT  Today's visit was # 5 out of 22.  Starting weight: 219 lbs Starting date: 06/25/16 Today's weight : 205 lbs  Today's date: 08/26/2016 Total lbs lost to date: 14 (Patients must lose 7 lbs in the first 6 months to continue with counseling)   ASK: We discussed the diagnosis of obesity with Grace Horton today and Grace Horton agreed to give [name] permission to discuss obesity behavioral modification therapy today.  ASSESS: Grace Horton has the diagnosis of obesity and her BMI today is 37.6 Grace Horton is in the action stage of change   ADVISE: Grace Horton was educated on the multiple health risks of obesity as well as the benefit of weight loss to improve her health. She was advised of the need for long term treatment and the importance of lifestyle modifications.  AGREE: Multiple dietary modification options and treatment options were discussed and  Grace Horton agreed to keep a food journal with 1000 to 1200 calories and 70+ grams of protein daily We discussed the following Behavioral Modification Strategies today: increasing lean protein intake, decrease eating out and dealing with family or coworker sabotage

## 2016-09-08 ENCOUNTER — Ambulatory Visit (INDEPENDENT_AMBULATORY_CARE_PROVIDER_SITE_OTHER): Payer: BC Managed Care – PPO | Admitting: Family Medicine

## 2016-09-08 VITALS — BP 110/73 | HR 68 | Temp 98.3°F | Ht 62.0 in | Wt 202.0 lb

## 2016-09-08 DIAGNOSIS — Z9189 Other specified personal risk factors, not elsewhere classified: Secondary | ICD-10-CM

## 2016-09-08 DIAGNOSIS — IMO0001 Reserved for inherently not codable concepts without codable children: Secondary | ICD-10-CM

## 2016-09-08 DIAGNOSIS — E669 Obesity, unspecified: Secondary | ICD-10-CM

## 2016-09-08 DIAGNOSIS — E559 Vitamin D deficiency, unspecified: Secondary | ICD-10-CM | POA: Diagnosis not present

## 2016-09-08 DIAGNOSIS — R7303 Prediabetes: Secondary | ICD-10-CM

## 2016-09-08 DIAGNOSIS — Z6836 Body mass index (BMI) 36.0-36.9, adult: Secondary | ICD-10-CM

## 2016-09-08 MED ORDER — METFORMIN HCL 500 MG PO TABS: 500.0000 mg | ORAL_TABLET | Freq: Two times a day (BID) | ORAL | 0 refills | Status: DC

## 2016-09-08 NOTE — Progress Notes (Signed)
Office: [phone]  /  Fax: [phone]   HPI:   Chief Complaint: OBESITY Grace Horton is here to discuss her progress with her obesity treatment plan. She is on the  keep a food journal with 1000 to 1200 calories and 70+ grams of protein  and is following her eating plan approximately 75 % of the time. She states she is exercising recumbent bike or walking for 30 to 60 minutes minutes 5 times per week. Grace Horton continues to do well with weight loss. She is planning ahead well and is increasing protein intake. Her weight is 202 lb (91.6 kg) today and has had a weight loss of 3 pounds over a period of 2 weeks since her last visit. She has lost 17 lbs since starting treatment with [name].  Pre-Diabetes Grace Horton has a diagnosis of pre-diabetes based on her elevated Hgb A1c and was informed this puts her at greater risk of developing diabetes. She is taking metformin currently and continues to work on diet and exercise to decrease risk of diabetes. She notes increased polyphagia in the evening and she denies nausea or hypoglycemia.  At risk for diabetes Grace Horton is at higher than average risk for developing diabetes due to her obesity and pre-diabetes. She currently denies polyuria or polydipsia.  Vitamin D deficiency Grace Horton has a diagnosis of vitamin D deficiency. She is currently taking vit D and denies nausea, vomiting or muscle weakness.  ALLERGIES: No Known Allergies  MEDICATIONS: Current Outpatient Prescriptions on File Prior to Visit  Medication Sig Dispense Refill  . Coenzyme Q10 10 MG capsule Take 100 mg by mouth daily.    . Diclofenac Sodium (PENNSAID) 2 % SOLN Fingertip amount to affected area twice daily 1 Bottle 2  . ethynodiol-ethinyl estradiol (KELNOR,ZOVIA) 1-35 MG-MCG tablet Take 1 tablet by mouth daily.    . hydrochlorothiazide (HYDRODIURIL) 25 MG tablet Take 25 mg by mouth daily.    . magnesium 30 MG tablet Take 1,250 mg by mouth at bedtime.    . Turmeric 450 MG CAPS Take by mouth  at bedtime.    . Vitamin D, Ergocalciferol, (DRISDOL) 50000 units CAPS capsule Take 1 capsule (50,000 Units total) by mouth every 7 (seven) days. 4 capsule 0   No current facility-administered medications on file prior to visit.     PAST MEDICAL HISTORY: Past Medical History:  Diagnosis Date  . AC (acromioclavicular) joint bone spurs   . Anxiety   . Back pain   . Chondromalacia   . Depression   . GERD (gastroesophageal reflux disease)   . History of shingles 2011   Around Left Eye  . Joint pain   . Raynaud disease   . Swelling    in feet or legs  . Vitamin D deficiency     PAST SURGICAL HISTORY: Past Surgical History:  Procedure Laterality Date  . CESAREAN SECTION    . SHOULDER SURGERY Left 2007  . TONSILLECTOMY      SOCIAL HISTORY: Social History  Substance Use Topics  . Smoking status: Never Smoker  . Smokeless tobacco: Never Used  . Alcohol use No    FAMILY HISTORY: Family History  Problem Relation Age of Onset  . Heart disease Father   . Diabetes Father   . Kidney disease Father        Stage III   . Transient ischemic attack Father        S/P Carotid endarterectomy  . Hypertension Father   . Hyperlipidemia Father   . Irritable  bowel syndrome Mother   . Hypertension Mother   . Hyperlipidemia Mother   . Obesity Mother     ROS: Review of Systems  Constitutional: Positive for weight loss.  Gastrointestinal: Negative for nausea and vomiting.  Genitourinary: Negative for frequency.  Musculoskeletal:       Negative muscle weakness  Endo/Heme/Allergies: Negative for polydipsia.       Polyphagia Negative hypoglycemia    PHYSICAL EXAM: Blood pressure 110/73, pulse 68, temperature 98.3 F (36.8 C), temperature source Oral, height 5\' 2"  (1.575 m), weight 202 lb (91.6 kg), SpO2 98 %. Body mass index is 36.95 kg/m. Physical Exam  Constitutional: She is oriented to person, place, and time. She appears well-developed and well-nourished.  Cardiovascular:  Normal rate.   Pulmonary/Chest: Effort normal.  Musculoskeletal: Normal range of motion.  Neurological: She is oriented to person, place, and time.  Skin: Skin is warm and dry.  Psychiatric: She has a normal mood and affect. Her behavior is normal.  Vitals reviewed.   RECENT LABS AND TESTS: BMET    Component Value Date/Time   NA 140 06/25/2016 1053   K 4.5 06/25/2016 1053   CL 102 06/25/2016 1053   CO2 26 06/25/2016 1053   GLUCOSE 85 06/25/2016 1053   GLUCOSE 83 05/27/2012 1119   BUN 13 06/25/2016 1053   CREATININE 0.68 06/25/2016 1053   CREATININE 0.81 05/27/2012 1119   CALCIUM 9.0 06/25/2016 1053   GFRNONAA 104 06/25/2016 1053   GFRAA 120 06/25/2016 1053   Lab Results  Component Value Date   HGBA1C 5.7 (H) 06/25/2016   Lab Results  Component Value Date   INSULIN 12.7 06/25/2016   CBC    Component Value Date/Time   WBC 8.3 06/25/2016 1053   WBC 6.7 05/27/2012 1119   RBC 4.15 06/25/2016 1053   RBC 4.46 05/27/2012 1119   HGB 12.2 06/25/2016 1053   HCT 37.1 06/25/2016 1053   PLT 317 05/27/2012 1119   MCV 89 06/25/2016 1053   MCH 29.4 06/25/2016 1053   MCH 30.7 05/27/2012 1119   MCHC 32.9 06/25/2016 1053   MCHC 33.7 05/27/2012 1119   RDW 14.9 06/25/2016 1053   LYMPHSABS 2.4 06/25/2016 1053   MONOABS 0.4 05/27/2012 1119   EOSABS 0.2 06/25/2016 1053   BASOSABS 0.0 06/25/2016 1053   Iron/TIBC/Ferritin/ %Sat No results found for: IRON, TIBC, FERRITIN, IRONPCTSAT Lipid Panel     Component Value Date/Time   CHOL 172 06/25/2016 1053   TRIG 85 06/25/2016 1053   HDL 47 06/25/2016 1053   LDLCALC 108 (H) 06/25/2016 1053   Hepatic Function Panel     Component Value Date/Time   PROT 6.3 06/25/2016 1053   ALBUMIN 4.0 06/25/2016 1053   AST 14 06/25/2016 1053   ALT 14 06/25/2016 1053   ALKPHOS 65 06/25/2016 1053   BILITOT 0.2 06/25/2016 1053      Component Value Date/Time   TSH 1.630 06/25/2016 1053    ASSESSMENT AND PLAN: Prediabetes - Plan: metFORMIN  (GLUCOPHAGE) 500 MG tablet  Vitamin D deficiency  At risk for diabetes mellitus  Class 2 obesity with serious comorbidity and body mass index (BMI) of 36.0 to 36.9 in adult, unspecified obesity type  PLAN:  Pre-Diabetes Shauntel will continue to work on weight loss, exercise, and decreasing simple carbohydrates in her diet to help decrease the risk of diabetes. We dicussed metformin including benefits and risks. She was informed that eating too many simple carbohydrates or too many calories at one sitting increases  the likelihood of GI side effects. Deonne agrees to increase metformin to 500 mg bid and Sabree agreed to follow up with [name] as directed to monitor her progress.  Diabetes risk counselling Grace Horton was given extended (15 minutes) diabetes prevention counseling today. She is 46 y.o. female and has risk factors for diabetes including obesity and pre-diabetes. We discussed intensive lifestyle modifications today with an emphasis on weight loss as well as increasing exercise and decreasing simple carbohydrates in her diet.  Vitamin D Deficiency Grace Horton was informed that low vitamin D levels contributes to fatigue and are associated with obesity, breast, and colon cancer. She agrees to continue to take prescription Vit D @50 ,000 IU every week and will follow up for routine testing of vitamin D, at least 2-3 times per year. She was informed of the risk of over-replacement of vitamin D and agrees to not increase her dose unless he discusses this with [name] first.  Obesity Grace Horton is currently in the action stage of change. As such, her goal is to continue with weight loss efforts She has agreed to keep a food journal with 1200 calories and 80 grams of protein daily and follow the Pescatarian eating plan Grace Horton has been instructed to work up to a goal of 150 minutes of combined cardio and strengthening exercise per week for weight loss and overall health benefits. We discussed the following  Behavioral Modification Strategies today: increasing lean protein intake, meal planning & cooking strategies and keeping healthy foods in the home.  Grace Horton has agreed to follow up with our clinic in 2 weeks. She was informed of the importance of frequent follow up visits to maximize her success with intensive lifestyle modifications for her multiple health conditions.  I, Doreene Nest, am acting as transcriptionist for Lacy Duverney, PA-C  I have reviewed the above documentation for accuracy and completeness, and I agree with the above. -Lacy Duverney, PA-C  I have reviewed the above note and agree with the plan. -Dennard Nip, MD  OBESITY BEHAVIORAL INTERVENTION VISIT  Today's visit was # 6 out of 22.  Starting weight: 219 lbs Starting date: 06/25/16 Today's weight : 202 lbs  Today's date: 09/08/2016 Total lbs lost to date: 17 (Patients must lose 7 lbs in the first 6 months to continue with counseling)   ASK: We discussed the diagnosis of obesity with Alben Spittle today and Latera agreed to give [name] permission to discuss obesity behavioral modification therapy today.  ASSESS: Arlisha has the diagnosis of obesity and her BMI today is 60 Riannah is in the action stage of change   ADVISE: Suttyn was educated on the multiple health risks of obesity as well as the benefit of weight loss to improve her health. She was advised of the need for long term treatment and the importance of lifestyle modifications.  AGREE: Multiple dietary modification options and treatment options were discussed and  Lakeitha agreed to keep a food journal with 1200 calories and 80 grams of protein daily and follow the Pescatarian eating plan We discussed the following Behavioral Modification Strategies today: increasing lean protein intake, meal planning & cooking strategies and keeping healthy foods in the home.

## 2016-09-11 ENCOUNTER — Ambulatory Visit: Payer: Self-pay

## 2016-09-11 ENCOUNTER — Ambulatory Visit (INDEPENDENT_AMBULATORY_CARE_PROVIDER_SITE_OTHER): Payer: BC Managed Care – PPO | Admitting: Family Medicine

## 2016-09-11 ENCOUNTER — Encounter: Payer: Self-pay | Admitting: Family Medicine

## 2016-09-11 VITALS — BP 122/82 | HR 74 | Ht 62.0 in | Wt 206.0 lb

## 2016-09-11 DIAGNOSIS — M25562 Pain in left knee: Secondary | ICD-10-CM | POA: Diagnosis not present

## 2016-09-11 DIAGNOSIS — M1710 Unilateral primary osteoarthritis, unspecified knee: Secondary | ICD-10-CM | POA: Diagnosis not present

## 2016-09-11 DIAGNOSIS — M25561 Pain in right knee: Secondary | ICD-10-CM | POA: Diagnosis not present

## 2016-09-11 NOTE — Progress Notes (Signed)
[facility] [address] [place], [place] [address] Phone: [phone] Subjective:    I'm seeing this patient by the request  of:  Zanard, Bernadene Bell, MD   CC: Knee pain  [id number]  Grace Horton is a 46 y.o. female coming in with complaint of knee pain. Been going on multiple months. Patient initially saw primary care provider. Patient states that unfortunately she did fall in her garage back in January. This seemed to start that. Seem to be intermittent for some time. Patient is on the anterior aspect of the knee. Get her significant pain with certain activities. Going up or downstairs has become very painful. Maybe some swelling. No radiation down the legs. Has not tried any true home modalities other than over-the-counter anti-inflammatories with minimal benefit.        Past Medical History:  Diagnosis Date  . AC (acromioclavicular) joint bone spurs   . Anxiety   . Back pain   . Chondromalacia   . Depression   . GERD (gastroesophageal reflux disease)   . History of shingles 2011   Around Left Eye  . Joint pain   . Raynaud disease   . Swelling    in feet or legs  . Vitamin D deficiency    Past Surgical History:  Procedure Laterality Date  . CESAREAN SECTION    . SHOULDER SURGERY Left 2007  . TONSILLECTOMY     Social History   Social History  . Marital status: Divorced    Spouse name: N/A  . Number of children: 1  . Years of education: 54   Occupational History  . [employer]   Social History Main Topics  . Smoking status: Never Smoker  . Smokeless tobacco: Never Used  . Alcohol use No  . Drug use: No  . Sexual activity: Yes    Partners: Male   Other Topics Concern  . Not on file   Social History Narrative   Marital Status: Divorced; (Significant Other - Todd)    Children:  Daughter Jarrett Soho) 1997    Pets:  Dog    Living Situation: Lives with Sherren Mocha and daughter.     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 Heart disease Father   . Diabetes Father   . Kidney disease Father        Stage III   . Transient ischemic attack Father        S/P Carotid endarterectomy  . Hypertension Father   . Hyperlipidemia Father   . Irritable bowel syndrome Mother   . Hypertension Mother   . Hyperlipidemia Mother   . Obesity Mother      Past medical history, social, surgical and family history all reviewed in electronic medical record.  No pertanent information unless stated regarding to the chief complaint.   Review of Systems:Review of systems updated and as accurate as of 09/11/16  No headache, visual changes, nausea, vomiting, diarrhea, constipation, dizziness, abdominal pain, skin rash, fevers, chills, night sweats, weight loss, swollen lymph nodes, body aches, joint swelling, muscle aches, chest pain,  shortness of breath, mood changes.   Objective  There were no vitals taken for this visit. Systems examined below as of 09/11/16   General: No apparent distress alert and oriented x3 mood and affect normal, dressed appropriately.  HEENT: Pupils equal, extraocular movements intact  Respiratory: Patient's speak in full sentences and does not appear short of breath  Cardiovascular: No lower extremity edema, non tender, no erythema  Skin: Warm dry intact with no signs of infection or rash on extremities or on axial skeleton.  Abdomen: Soft nontender  Neuro: Cranial nerves II through XII are intact, neurovascularly intact in all extremities with 2+ DTRs and 2+ pulses.  Lymph: No lymphadenopathy of posterior or anterior cervical chain or axillae bilaterally.  Gait normal with good balance  and coordination.  MSK:  Non tender with full range of motion and good stability and symmetric strength and tone of shoulders, elbows, wrist, hip, and ankles bilaterally.  Knee: bilateral Mild lateral tilt of the patella bilaterally Palpation normal with no warmth, joint line tenderness, patellar tenderness, or condyle tenderness. ROM full in flexion and extension and lower leg rotation. Ligaments with solid consistent endpoints including ACL, PCL, LCL, MCL. Negative Mcmurray's, Apley's, and Thessalonian tests. painful patellar compression. Patellar glide mild to moderate crepitus. Patellar and quadriceps tendons unremarkable. Hamstring and quadriceps strength is normal.    MSK US performed of: bilateral  This study was ordered, performed, and interpreted by Charlann Boxer D.O.  Knee: A moderate narrowing of the patellofemoral joint bilaterally record of left. Patient also has some very mild narrowing of the medial joint space. No meniscal injury noted. All ligaments appear to be intact.  IMPRESSION: Patellofemoral arthritis    Impression and Recommendations:     This case required medical decision making of moderate complexity.      Note: This dictation was prepared with Dragon dictation along with smaller phrase technology. Any transcriptional errors that result from this process are unintentional.

## 2016-09-11 NOTE — Patient Instructions (Addendum)
Good to see you.  Ice 20 minutes 2 times daily. Usually after activity and before bed. Exercises 3 times a week.  pennsaid pinkie amount topically 2 times daily as needed.  Make sure there is still a little bend at the end of the peddle on the bike Consider swimming and elliptical.  Good shoes with rigid bottom.  Jalene Mullet, [place] or New balance greater then 700 [employer]co orthotics "total support" online would be great  See me again in 4 weeks  For the arm get a compression sleeve and avoid direct impact and have the big dog walk the small dog ;)

## 2016-09-11 NOTE — Assessment & Plan Note (Signed)
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 Has had difficult he with this in the past. Patient will continue to be active otherwise. We discussed icing regimen. Patient will come back and see me in 4 weeks. If worsening pain consider injection

## 2016-09-16 ENCOUNTER — Encounter: Payer: Self-pay | Admitting: Family Medicine

## 2016-09-17 MED ORDER — VITAMIN D (ERGOCALCIFEROL) 1.25 MG (50000 UNIT) PO CAPS: 50000.0000 [IU] | ORAL_CAPSULE | ORAL | 0 refills | Status: DC

## 2016-09-22 ENCOUNTER — Ambulatory Visit (INDEPENDENT_AMBULATORY_CARE_PROVIDER_SITE_OTHER): Payer: BC Managed Care – PPO | Admitting: Physician Assistant

## 2016-09-22 VITALS — BP 127/81 | HR 72 | Temp 98.4°F | Ht 62.0 in | Wt 202.0 lb

## 2016-09-22 DIAGNOSIS — R7303 Prediabetes: Secondary | ICD-10-CM | POA: Diagnosis not present

## 2016-09-22 DIAGNOSIS — E559 Vitamin D deficiency, unspecified: Secondary | ICD-10-CM

## 2016-09-22 DIAGNOSIS — E669 Obesity, unspecified: Secondary | ICD-10-CM

## 2016-09-22 DIAGNOSIS — Z6837 Body mass index (BMI) 37.0-37.9, adult: Secondary | ICD-10-CM | POA: Diagnosis not present

## 2016-09-22 DIAGNOSIS — IMO0001 Reserved for inherently not codable concepts without codable children: Secondary | ICD-10-CM

## 2016-09-22 MED ORDER — METFORMIN HCL 500 MG PO TABS: 500.0000 mg | ORAL_TABLET | Freq: Two times a day (BID) | ORAL | 0 refills | Status: DC

## 2016-09-22 NOTE — Progress Notes (Signed)
Office: [phone]  /  Fax: [phone]   HPI:   Chief Complaint: OBESITY Grace Horton is here to discuss her progress with her obesity treatment plan. She is on the  keep a food journal with 1200 calories and 80 grams of protein  and follow the Pescatarian eating plan and is following her eating plan approximately 100 % of the time. She states she is exercising elliptical, bike and therapy for 45 to 60 minutes 33 to 5 times per week. Grace Horton maintained weight over vacation. She has been increasing her lean protein intake and plans ahead well. She controlled her portions and made smarter food choices over vacation, and is now ready to get back to the meal plan. Her weight is 202 lb (91.6 kg) today and has maintained weight over a period of 2 weeks since her last visit. She has lost 17 lbs since starting treatment with [name].  Vitamin D deficiency Grace Horton has a diagnosis of vitamin D deficiency. She is currently taking vit D and denies nausea, vomiting or muscle weakness.  Pre-Diabetes Grace Horton has a diagnosis of pre-diabetes based on her elevated Hgb A1c and was informed this puts her at greater risk of developing diabetes. She is taking metformin currently and continues to work on diet and exercise to decrease risk of diabetes. She denies nausea, polyphagia or hypoglycemia.  ALLERGIES: No Known Allergies  MEDICATIONS: Current Outpatient Prescriptions on File Prior to Visit  Medication Sig Dispense Refill  . Coenzyme Q10 10 MG capsule Take 100 mg by mouth daily.    . Diclofenac Sodium (PENNSAID) 2 % SOLN Fingertip amount to affected area twice daily 1 Bottle 2  . ethynodiol-ethinyl estradiol (KELNOR,ZOVIA) 1-35 MG-MCG tablet Take 1 tablet by mouth daily.    . hydrochlorothiazide (HYDRODIURIL) 25 MG tablet Take 25 mg by mouth daily.    . magnesium 30 MG tablet Take 1,250 mg by mouth at bedtime.    . Turmeric 450 MG CAPS Take by mouth at bedtime.    . Vitamin D, Ergocalciferol, (DRISDOL) 50000  units CAPS capsule Take 1 capsule (50,000 Units total) by mouth every 7 (seven) days. 4 capsule 0  . Vitamin D, Ergocalciferol, (DRISDOL) 50000 units CAPS capsule Take 1 capsule (50,000 Units total) by mouth every 7 (seven) days. 12 capsule 0   No current facility-administered medications on file prior to visit.     PAST MEDICAL HISTORY: Past Medical History:  Diagnosis Date  . AC (acromioclavicular) joint bone spurs   . Anxiety   . Back pain   . Chondromalacia   . Depression   . GERD (gastroesophageal reflux disease)   . History of shingles 2011   Around Left Eye  . Joint pain   . Raynaud disease   . Swelling    in feet or legs  . Vitamin D deficiency     PAST SURGICAL HISTORY: Past Surgical History:  Procedure Laterality Date  . CESAREAN SECTION    . SHOULDER SURGERY Left 2007  . TONSILLECTOMY      SOCIAL HISTORY: Social History  Substance Use Topics  . Smoking status: Never Smoker  . Smokeless tobacco: Never Used  . Alcohol use No    FAMILY HISTORY: Family History  Problem Relation Age of Onset  . Heart disease Father   . Diabetes Father   . Kidney disease Father        Stage III   . Transient ischemic attack Father        S/P Carotid endarterectomy  .  Hypertension Father   . Hyperlipidemia Father   . Irritable bowel syndrome Mother   . Hypertension Mother   . Hyperlipidemia Mother   . Obesity Mother     ROS: Review of Systems  Constitutional: Negative for weight loss.  Gastrointestinal: Negative for nausea and vomiting.  Genitourinary: Negative for frequency.  Musculoskeletal:       Negative muscle weakness  Endo/Heme/Allergies: Negative for polydipsia.       Negative hypoglycemia Negative polyphagia    PHYSICAL EXAM: Blood pressure 127/81, pulse 72, temperature 98.4 F (36.9 C), temperature source Oral, height 5\' 2"  (1.575 m), weight 202 lb (91.6 kg), SpO2 98 %. Body mass index is 36.95 kg/m. Physical Exam  Constitutional: She is  oriented to person, place, and time. She appears well-developed and well-nourished.  Cardiovascular: Normal rate.   Pulmonary/Chest: Effort normal.  Musculoskeletal: Normal range of motion.  Neurological: She is oriented to person, place, and time.  Skin: Skin is warm and dry.  Psychiatric: She has a normal mood and affect. Her behavior is normal.  Vitals reviewed.   RECENT LABS AND TESTS: BMET    Component Value Date/Time   NA 140 06/25/2016 1053   K 4.5 06/25/2016 1053   CL 102 06/25/2016 1053   CO2 26 06/25/2016 1053   GLUCOSE 85 06/25/2016 1053   GLUCOSE 83 05/27/2012 1119   BUN 13 06/25/2016 1053   CREATININE 0.68 06/25/2016 1053   CREATININE 0.81 05/27/2012 1119   CALCIUM 9.0 06/25/2016 1053   GFRNONAA 104 06/25/2016 1053   GFRAA 120 06/25/2016 1053   Lab Results  Component Value Date   HGBA1C 5.7 (H) 06/25/2016   Lab Results  Component Value Date   INSULIN 12.7 06/25/2016   CBC    Component Value Date/Time   WBC 8.3 06/25/2016 1053   WBC 6.7 05/27/2012 1119   RBC 4.15 06/25/2016 1053   RBC 4.46 05/27/2012 1119   HGB 12.2 06/25/2016 1053   HCT 37.1 06/25/2016 1053   PLT 317 05/27/2012 1119   MCV 89 06/25/2016 1053   MCH 29.4 06/25/2016 1053   MCH 30.7 05/27/2012 1119   MCHC 32.9 06/25/2016 1053   MCHC 33.7 05/27/2012 1119   RDW 14.9 06/25/2016 1053   LYMPHSABS 2.4 06/25/2016 1053   MONOABS 0.4 05/27/2012 1119   EOSABS 0.2 06/25/2016 1053   BASOSABS 0.0 06/25/2016 1053   Iron/TIBC/Ferritin/ %Sat No results found for: IRON, TIBC, FERRITIN, IRONPCTSAT Lipid Panel     Component Value Date/Time   CHOL 172 06/25/2016 1053   TRIG 85 06/25/2016 1053   HDL 47 06/25/2016 1053   LDLCALC 108 (H) 06/25/2016 1053   Hepatic Function Panel     Component Value Date/Time   PROT 6.3 06/25/2016 1053   ALBUMIN 4.0 06/25/2016 1053   AST 14 06/25/2016 1053   ALT 14 06/25/2016 1053   ALKPHOS 65 06/25/2016 1053   BILITOT 0.2 06/25/2016 1053      Component  Value Date/Time   TSH 1.630 06/25/2016 1053    ASSESSMENT AND PLAN: Prediabetes - Plan: metFORMIN (GLUCOPHAGE) 500 MG tablet  Vitamin D deficiency  Class 2 obesity with serious comorbidity and body mass index (BMI) of 37.0 to 37.9 in adult, unspecified obesity type  PLAN:  Vitamin D Deficiency Grace Horton was informed that low vitamin D levels contributes to fatigue and are associated with obesity, breast, and colon cancer. She agrees to continue to take prescription Vit D @50 ,000 IU every week and will follow up for routine  testing of vitamin D, at least 2-3 times per year. She was informed of the risk of over-replacement of vitamin D and agrees to not increase her dose unless he discusses this with [name] first.  Pre-Diabetes Grace Horton will continue to work on weight loss, exercise, and decreasing simple carbohydrates in her diet to help decrease the risk of diabetes. We dicussed metformin including benefits and risks. She was informed that eating too many simple carbohydrates or too many calories at one sitting increases the likelihood of GI side effects. Kajol agrees to continue metformin for now and a prescription was written today for metformin 500 mg bid #60 with no refills. Grace Horton agreed to follow up with [name] as directed to monitor her progress.  Obesity Grace Horton is currently in the action stage of change. As such, her goal is to continue with weight loss efforts She has agreed to follow the [place] eating plan Grace Horton has been instructed to work up to a goal of 150 minutes of combined cardio and strengthening exercise per week for weight loss and overall health benefits. We discussed the following Behavioral Modification Strategies today: meal planning & cooking strategies and increasing lean protein intake  Grace Horton has agreed to follow up with our clinic in 2 weeks fasting. She was informed of the importance of frequent follow up visits to maximize her success with intensive lifestyle  modifications for her multiple health conditions.  I, Doreene Nest, am acting as transcriptionist for Lacy Duverney, PA-C  I have reviewed the above documentation for accuracy and completeness, and I agree with the above. -Lacy Duverney, PA-C  I have reviewed the above note and agree with the plan. -Dennard Nip, MD    OBESITY BEHAVIORAL INTERVENTION VISIT  Today's visit was # 7 out of 22.  Starting weight: 219 lbs Starting date: 06/25/16 Today's weight : 202 lbs Today's date: 09/22/2016 Total lbs lost to date: 17 (Patients must lose 7 lbs in the first 6 months to continue with counseling)   ASK: We discussed the diagnosis of obesity with Grace Horton today and Grace Horton agreed to give [name] permission to discuss obesity behavioral modification therapy today.  ASSESS: Grace Horton has the diagnosis of obesity and her BMI today is 75 Grace Horton is in the action stage of change   ADVISE: Grace Horton was educated on the multiple health risks of obesity as well as the benefit of weight loss to improve her health. She was advised of the need for long term treatment and the importance of lifestyle modifications.  AGREE: Multiple dietary modification options and treatment options were discussed and  Grace Horton agreed to follow the Pescatarian eating plan We discussed the following Behavioral Modification Strategies today: meal planning & cooking strategies and increasing lean protein intake

## 2016-10-08 ENCOUNTER — Ambulatory Visit (INDEPENDENT_AMBULATORY_CARE_PROVIDER_SITE_OTHER): Payer: BC Managed Care – PPO | Admitting: Physician Assistant

## 2016-10-08 VITALS — BP 114/79 | Temp 98.3°F | Ht 62.0 in | Wt 200.0 lb

## 2016-10-08 DIAGNOSIS — IMO0001 Reserved for inherently not codable concepts without codable children: Secondary | ICD-10-CM

## 2016-10-08 DIAGNOSIS — E784 Other hyperlipidemia: Secondary | ICD-10-CM

## 2016-10-08 DIAGNOSIS — R7989 Other specified abnormal findings of blood chemistry: Secondary | ICD-10-CM | POA: Insufficient documentation

## 2016-10-08 DIAGNOSIS — I10 Essential (primary) hypertension: Secondary | ICD-10-CM | POA: Insufficient documentation

## 2016-10-08 DIAGNOSIS — E559 Vitamin D deficiency, unspecified: Secondary | ICD-10-CM

## 2016-10-08 DIAGNOSIS — E669 Obesity, unspecified: Secondary | ICD-10-CM | POA: Diagnosis not present

## 2016-10-08 DIAGNOSIS — R946 Abnormal results of thyroid function studies: Secondary | ICD-10-CM | POA: Diagnosis not present

## 2016-10-08 DIAGNOSIS — Z6836 Body mass index (BMI) 36.0-36.9, adult: Secondary | ICD-10-CM

## 2016-10-08 DIAGNOSIS — R7303 Prediabetes: Secondary | ICD-10-CM | POA: Diagnosis not present

## 2016-10-08 DIAGNOSIS — E7849 Other hyperlipidemia: Secondary | ICD-10-CM

## 2016-10-08 DIAGNOSIS — K5909 Other constipation: Secondary | ICD-10-CM | POA: Diagnosis not present

## 2016-10-08 NOTE — Progress Notes (Signed)
Office: [phone]  /  Fax: [phone]   HPI:   Chief Complaint: OBESITY Grace Horton is here to discuss her progress with her obesity treatment plan. She is on the Pescatarian plan and is following her eating plan approximately 100 % of the time. She states she is exercising 0 minutes 0 times per week. Grace Horton continues to do well with weight loss and still travels for work and makes smart choices and controls her portions.  Her weight is 200 lb (90.7 kg) today and has had a weight loss of 2 pounds over a period of 2 weeks since her last visit. She has lost 19 lbs since starting treatment with [name].  Constipation Grace Horton notes constipation for the last few weeks, worse since attempting weight loss. She states BM are less frequent and are not hard and painful. Grace Horton denies hematochezia or melena. She states she has been traveling and has not been drinking as much water recently.  Hypertension Grace Horton is a 46 y.o. female with hypertension. Grace Horton denies chest pain or shortness of breath on exertion. She is working weight loss to help control her blood pressure with the goal of decreasing her risk of heart attack and stroke. Deannas blood pressure is stable.  Vitamin D deficiency Grace Horton has a diagnosis of vitamin D deficiency. She is currently taking vit D and denies nausea, vomiting or muscle weakness.  Hyperlipidemia Grace Horton has hyperlipidemia and has been trying to improve her cholesterol levels with intensive lifestyle modification including a low saturated fat diet, exercise and weight loss. Grace Horton is not on statin and denies any chest pain, claudication or myalgias.  Elevated T3 Grace Horton admits to having fatigue and constipation but denies palpitations and heat/cold intolerance. Grace Horton is not on medications.  Pre-Diabetes Grace Horton has a diagnosis of prediabetes based on her elevated Hgb A1c and was informed this puts her at greater risk of developing diabetes. She is taking  metformin currently and continues to work on diet and exercise to decrease risk of diabetes. She denies nausea, polyphagia or hypoglycemia.  ALLERGIES: No Known Allergies  MEDICATIONS: Current Outpatient Prescriptions on File Prior to Visit  Medication Sig Dispense Refill  . Coenzyme Q10 10 MG capsule Take 100 mg by mouth daily.    . Diclofenac Sodium (PENNSAID) 2 % SOLN Fingertip amount to affected area twice daily 1 Bottle 2  . hydrochlorothiazide (HYDRODIURIL) 25 MG tablet Take 25 mg by mouth daily.    . magnesium 30 MG tablet Take 1,250 mg by mouth at bedtime.    . metFORMIN (GLUCOPHAGE) 500 MG tablet Take 1 tablet (500 mg total) by mouth 2 (two) times daily with a meal. 60 tablet 0  . Turmeric 450 MG CAPS Take by mouth at bedtime.    . Vitamin D, Ergocalciferol, (DRISDOL) 50000 units CAPS capsule Take 1 capsule (50,000 Units total) by mouth every 7 (seven) days. 4 capsule 0  . Vitamin D, Ergocalciferol, (DRISDOL) 50000 units CAPS capsule Take 1 capsule (50,000 Units total) by mouth every 7 (seven) days. 12 capsule 0   No current facility-administered medications on file prior to visit.     PAST MEDICAL HISTORY: Past Medical History:  Diagnosis Date  . AC (acromioclavicular) joint bone spurs   . Anxiety   . Back pain   . Chondromalacia   . Depression   . GERD (gastroesophageal reflux disease)   . History of shingles 2011   Around Left Eye  . Joint pain   . Raynaud disease   .  Swelling    in feet or legs  . Vitamin D deficiency     PAST SURGICAL HISTORY: Past Surgical History:  Procedure Laterality Date  . CESAREAN SECTION    . SHOULDER SURGERY Left 2007  . TONSILLECTOMY      SOCIAL HISTORY: Social History  Substance Use Topics  . Smoking status: Never Smoker  . Smokeless tobacco: Never Used  . Alcohol use No    FAMILY HISTORY: Family History  Problem Relation Age of Onset  . Heart disease Father   . Diabetes Father   . Kidney disease Father        Stage  III   . Transient ischemic attack Father        S/P Carotid endarterectomy  . Hypertension Father   . Hyperlipidemia Father   . Irritable bowel syndrome Mother   . Hypertension Mother   . Hyperlipidemia Mother   . Obesity Mother     ROS: Review of Systems  Constitutional: Positive for weight loss.  Respiratory: Negative for shortness of breath.   Cardiovascular: Negative for chest pain, palpitations and claudication.  Gastrointestinal: Negative for blood in stool, melena, nausea and vomiting.       Negative hematochezia  Musculoskeletal: Negative for myalgias.       Negative muscle weakness  Endo/Heme/Allergies:       Negative polyphagia Negative hypoglycemia    PHYSICAL EXAM: Blood pressure 114/79, temperature 98.3 F (36.8 C), temperature source Oral, height 5\' 2"  (1.575 m), weight 200 lb (90.7 kg), SpO2 98 %. Body mass index is 36.58 kg/m. Physical Exam  Constitutional: She is oriented to person, place, and time. She appears well-developed and well-nourished.  Cardiovascular: Normal rate.   Pulmonary/Chest: Effort normal.  Musculoskeletal: Normal range of motion.  Neurological: She is oriented to person, place, and time.  Skin: Skin is warm and dry.  Psychiatric: She has a normal mood and affect. Her behavior is normal.  Vitals reviewed.   RECENT LABS AND TESTS: BMET    Component Value Date/Time   NA 140 06/25/2016 1053   K 4.5 06/25/2016 1053   CL 102 06/25/2016 1053   CO2 26 06/25/2016 1053   GLUCOSE 85 06/25/2016 1053   GLUCOSE 83 05/27/2012 1119   BUN 13 06/25/2016 1053   CREATININE 0.68 06/25/2016 1053   CREATININE 0.81 05/27/2012 1119   CALCIUM 9.0 06/25/2016 1053   GFRNONAA 104 06/25/2016 1053   GFRAA 120 06/25/2016 1053   Lab Results  Component Value Date   HGBA1C 5.7 (H) 06/25/2016   Lab Results  Component Value Date   INSULIN 12.7 06/25/2016   CBC    Component Value Date/Time   WBC 8.3 06/25/2016 1053   WBC 6.7 05/27/2012 1119   RBC  4.15 06/25/2016 1053   RBC 4.46 05/27/2012 1119   HGB 12.2 06/25/2016 1053   HCT 37.1 06/25/2016 1053   PLT 317 05/27/2012 1119   MCV 89 06/25/2016 1053   MCH 29.4 06/25/2016 1053   MCH 30.7 05/27/2012 1119   MCHC 32.9 06/25/2016 1053   MCHC 33.7 05/27/2012 1119   RDW 14.9 06/25/2016 1053   LYMPHSABS 2.4 06/25/2016 1053   MONOABS 0.4 05/27/2012 1119   EOSABS 0.2 06/25/2016 1053   BASOSABS 0.0 06/25/2016 1053   Iron/TIBC/Ferritin/ %Sat No results found for: IRON, TIBC, FERRITIN, IRONPCTSAT Lipid Panel     Component Value Date/Time   CHOL 172 06/25/2016 1053   TRIG 85 06/25/2016 1053   HDL 47 06/25/2016 1053  [place] 108 (H) 06/25/2016 1053   Hepatic Function Panel     Component Value Date/Time   PROT 6.3 06/25/2016 1053   ALBUMIN 4.0 06/25/2016 1053   AST 14 06/25/2016 1053   ALT 14 06/25/2016 1053   ALKPHOS 65 06/25/2016 1053   BILITOT 0.2 06/25/2016 1053      Component Value Date/Time   TSH 1.630 06/25/2016 1053    ASSESSMENT AND PLAN: Other constipation  Essential hypertension  Vitamin D deficiency - Plan: VITAMIN D 25 Hydroxy (Vit-D Deficiency, Fractures)  Other hyperlipidemia - Plan: Lipid Panel With LDL/HDL Ratio  Increased thyroid stimulating hormone (TSH) level - elevated T3 - Plan: T3, T4, free, TSH  Prediabetes - Plan: Comprehensive metabolic panel, Hemoglobin A1c, Insulin, random  Class 2 obesity with serious comorbidity and body mass index (BMI) of 36.0 to 36.9 in adult, unspecified obesity type  PLAN:  Constipation Grace Horton was informed decrease bowel movement frequency is normal while losing weight, but stools should not be hard or painful. She was advised to increase her H20 intake and work on increasing her fiber intake. High fiber foods were discussed today. Grace Horton agreed to start miralax 1 capful daily PRN and she will follow up with our clinic in 2 weeks.  Hypertension We discussed sodium restriction, working on healthy weight loss, and a  regular exercise program as the means to achieve improved blood pressure control. Grace Horton agreed with this plan and agreed to follow up as directed. We will continue to monitor her blood pressure as well as her progress with the above lifestyle modifications.  She will continue her medications as prescribed and will watch for signs of hypotension as she continues her lifestyle modifications. We will recheck labs.  Vitamin D Deficiency Grace Horton was informed that low vitamin D levels contributes to fatigue and are associated with obesity, breast, and colon cancer. She agrees to continue taking prescription Vit D @50 ,000 IU every week and will follow up for routine testing of vitamin D, at least 2-3 times per year and we will recheck labs. She was informed of the risk of over-replacement of vitamin D and agrees to not increase her dose unless he discusses this with [name] first.  Hyperlipidemia Grace Horton was informed of the American Heart Association Guidelines emphasizing intensive lifestyle modifications as the first line treatment for hyperlipidemia. We discussed many lifestyle modifications today in depth, and Grace Horton will continue to work on decreasing saturated fats such as fatty red meat, butter and many fried foods. She will also increase vegetables and lean protein in her diet and continue to work on exercise and weight loss efforts.  Elevated T3 We will recheck labs and Grace Horton agrees to follow up with our clinic in 2 weeks.  Pre-Diabetes Grace Horton will continue to work on weight loss, exercise, and decreasing simple carbohydrates in her diet to help decrease the risk of diabetes. We dicussed metformin including benefits and risks. She was informed that eating too many simple carbohydrates or too many calories at one sitting increases the likelihood of GI side effects. Grace Horton agreed to continue metformin and we will recheck labs. She agreed to follow up with [name] as directed to monitor her  progress.  Obesity Grace Horton is currently in the action stage of change. As such, her goal is to continue with weight loss efforts She has agreed to follow the [place] eating plan Piedad has been instructed to work up to a goal of 150 minutes of combined cardio and strengthening exercise per week for weight  loss and overall health benefits. We discussed the following Behavioral Modification Strategies today: increasing lean protein intake, increase H20 intake, and increase high fiber foods   [place] has agreed to follow up with our clinic in 2 weeks. She was informed of the importance of frequent follow up visits to maximize her success with intensive lifestyle modifications for her multiple health conditions.  I, Grace Horton, am acting as transcriptionist for Lacy Duverney, PA-C  I have reviewed the above documentation for accuracy and completeness, and I agree with the above. -Lacy Duverney, PA-C  I have reviewed the above note and agree with the plan. -Dennard Nip, MD   OBESITY BEHAVIORAL INTERVENTION VISIT  Today's visit was # 8 out of 22.  Starting weight: 219 lbs Starting date: 06/25/16 Today's weight : 200 lbs  Today's date: 10/08/16 Total lbs lost to date: 5 (Patients must lose 7 lbs in the first 6 months to continue with counseling)   ASK: We discussed the diagnosis of obesity with Alben Spittle today and Jenean agreed to give [name] permission to discuss obesity behavioral modification therapy today.  ASSESS: Araceli has the diagnosis of obesity and her BMI today is 70 Dillie is in the action stage of change   ADVISE: Candyce was educated on the multiple health risks of obesity as well as the benefit of weight loss to improve her health. She was advised of the need for long term treatment and the importance of lifestyle modifications.  AGREE: Multiple dietary modification options and treatment options were discussed and  Cathryn agreed to Pescatarian plan We discussed the  following Behavioral Modification Strategies today: increasing lean protein intake, increase H20 intake, and increase high fiber foods

## 2016-10-09 ENCOUNTER — Ambulatory Visit: Payer: BC Managed Care – PPO | Admitting: Family Medicine

## 2016-10-09 LAB — COMPREHENSIVE METABOLIC PANEL
ALT: 16 IU/L (ref 0–32)
AST: 18 IU/L (ref 0–40)
Albumin/Globulin Ratio: 1.7 (ref 1.2–2.2)
Albumin: 4.3 g/dL (ref 3.5–5.5)
Alkaline Phosphatase: 66 IU/L (ref 39–117)
BUN/Creatinine Ratio: 18 (ref 9–23)
BUN: 13 mg/dL (ref 6–24)
Bilirubin Total: 0.3 mg/dL (ref 0.0–1.2)
CHLORIDE: 101 mmol/L (ref 96–106)
CO2: 25 mmol/L (ref 20–29)
Calcium: 9.1 mg/dL (ref 8.7–10.2)
Creatinine, Ser: 0.74 mg/dL (ref 0.57–1.00)
GFR calc non Af Amer: 96 mL/min/{1.73_m2} (ref >59)
GFR, EST AFRICAN AMERICAN: 111 mL/min/{1.73_m2} (ref >59)
GLUCOSE: 85 mg/dL (ref 65–99)
Globulin, Total: 2.6 g/dL (ref 1.5–4.5)
Potassium: 4.3 mmol/L (ref 3.5–5.2)
Sodium: 141 mmol/L (ref 134–144)
TOTAL PROTEIN: 6.9 g/dL (ref 6.0–8.5)

## 2016-10-09 LAB — LIPID PANEL WITH LDL/HDL RATIO
CHOLESTEROL TOTAL: 178 mg/dL (ref 100–199)
HDL: 39 mg/dL — ABNORMAL LOW (ref >39)
LDL Calculated: 124 mg/dL — ABNORMAL HIGH (ref 0–99)
LDl/HDL Ratio: 3.2 ratio (ref 0.0–3.2)
Triglycerides: 75 mg/dL (ref 0–149)
VLDL CHOLESTEROL CAL: 15 mg/dL (ref 5–40)

## 2016-10-09 LAB — VITAMIN D 25 HYDROXY (VIT D DEFICIENCY, FRACTURES): Vit D, 25-Hydroxy: 65.1 ng/mL (ref 30.0–100.0)

## 2016-10-09 LAB — INSULIN, RANDOM: INSULIN: 9.7 u[IU]/mL (ref 2.6–24.9)

## 2016-10-09 LAB — HEMOGLOBIN A1C
Est. average glucose Bld gHb Est-mCnc: 105 mg/dL
HEMOGLOBIN A1C: 5.3 % (ref 4.8–5.6)

## 2016-10-09 LAB — T4, FREE: FREE T4: 1.17 ng/dL (ref 0.82–1.77)

## 2016-10-09 LAB — T3: T3 TOTAL: 176 ng/dL (ref 71–180)

## 2016-10-09 LAB — TSH: TSH: 1.5 u[IU]/mL (ref 0.450–4.500)

## 2016-10-20 ENCOUNTER — Ambulatory Visit: Payer: BC Managed Care – PPO | Admitting: Family Medicine

## 2016-10-22 ENCOUNTER — Ambulatory Visit (INDEPENDENT_AMBULATORY_CARE_PROVIDER_SITE_OTHER): Payer: BC Managed Care – PPO | Admitting: Physician Assistant

## 2016-10-22 VITALS — BP 109/74 | HR 66 | Temp 98.3°F | Ht 62.0 in | Wt 198.0 lb

## 2016-10-22 DIAGNOSIS — E784 Other hyperlipidemia: Secondary | ICD-10-CM | POA: Diagnosis not present

## 2016-10-22 DIAGNOSIS — E669 Obesity, unspecified: Secondary | ICD-10-CM

## 2016-10-22 DIAGNOSIS — E7849 Other hyperlipidemia: Secondary | ICD-10-CM

## 2016-10-22 DIAGNOSIS — IMO0001 Reserved for inherently not codable concepts without codable children: Secondary | ICD-10-CM

## 2016-10-22 DIAGNOSIS — R7303 Prediabetes: Secondary | ICD-10-CM

## 2016-10-22 DIAGNOSIS — Z6836 Body mass index (BMI) 36.0-36.9, adult: Secondary | ICD-10-CM

## 2016-10-22 MED ORDER — METFORMIN HCL 500 MG PO TABS: 500.0000 mg | ORAL_TABLET | Freq: Two times a day (BID) | ORAL | 0 refills | Status: DC

## 2016-10-22 NOTE — Progress Notes (Signed)
Office: [phone]  /  Fax: [phone]   HPI:   Chief Complaint: OBESITY Grace Horton is here to discuss her progress with her obesity treatment plan. She is on the Pescatarian eating plan and is following her eating plan approximately 100 % of the time. She states she is walking for 45 to 60 minutes 3 times per week. Grace Horton continues to do well with weight loss, despite traveling for work. She has done well with planning her meals ahead of time. Also, she has been able to exercise more. Her weight is 198 lb (89.8 kg) today and has had a weight loss of 2 pounds over a period of 2 weeks since her last visit. She has lost 21 lbs since starting treatment with [name].  Pre-Diabetes Grace Horton has a diagnosis of pre-diabetes and her A1c has improved to 5.3. She was informed this puts her at greater risk of developing diabetes. She is taking metformin currently and continues to work on diet and exercise to decrease risk of diabetes. She denies nausea, polyphagia or hypoglycemia.  Hyperlipidemia Grace Horton has hyperlipidemia and has been trying to improve her cholesterol levels with intensive lifestyle modification including a low saturated fat diet, exercise and weight loss. Her LDL is not at goal and she declines statins. She denies any chest pain, claudication or myalgias.  ALLERGIES: No Known Allergies  MEDICATIONS: Current Outpatient Prescriptions on File Prior to Visit  Medication Sig Dispense Refill  . Coenzyme Q10 10 MG capsule Take 100 mg by mouth daily.    . Diclofenac Sodium (PENNSAID) 2 % SOLN Fingertip amount to affected area twice daily 1 Bottle 2  . hydrochlorothiazide (HYDRODIURIL) 25 MG tablet Take 25 mg by mouth daily.    Marland Kitchen levonorgestrel-ethinyl estradiol (AVIANE,ALESSE,LESSINA) 0.1-20 MG-MCG tablet Take 1 tablet by mouth daily.    . magnesium 30 MG tablet Take 1,250 mg by mouth at bedtime.    . Turmeric 450 MG CAPS Take by mouth at bedtime.    . Vitamin D, Ergocalciferol, (DRISDOL)  50000 units CAPS capsule Take 1 capsule (50,000 Units total) by mouth every 7 (seven) days. 4 capsule 0  . Vitamin D, Ergocalciferol, (DRISDOL) 50000 units CAPS capsule Take 1 capsule (50,000 Units total) by mouth every 7 (seven) days. 12 capsule 0   No current facility-administered medications on file prior to visit.     PAST MEDICAL HISTORY: Past Medical History:  Diagnosis Date  . AC (acromioclavicular) joint bone spurs   . Anxiety   . Back pain   . Chondromalacia   . Depression   . GERD (gastroesophageal reflux disease)   . History of shingles 2011   Around Left Eye  . Joint pain   . Raynaud disease   . Swelling    in feet or legs  . Vitamin D deficiency     PAST SURGICAL HISTORY: Past Surgical History:  Procedure Laterality Date  . CESAREAN SECTION    . SHOULDER SURGERY Left 2007  . TONSILLECTOMY      SOCIAL HISTORY: Social History  Substance Use Topics  . Smoking status: Never Smoker  . Smokeless tobacco: Never Used  . Alcohol use No    FAMILY HISTORY: Family History  Problem Relation Age of Onset  . Heart disease Father   . Diabetes Father   . Kidney disease Father        Stage III   . Transient ischemic attack Father        S/P Carotid endarterectomy  . Hypertension Father   .  Hyperlipidemia Father   . Irritable bowel syndrome Mother   . Hypertension Mother   . Hyperlipidemia Mother   . Obesity Mother     ROS: Review of Systems  Constitutional: Positive for weight loss.  Cardiovascular: Negative for chest pain and claudication.  Gastrointestinal: Negative for nausea.  Musculoskeletal: Negative for myalgias.  Endo/Heme/Allergies:       Negative polyphagia Negative hypoglycemia    PHYSICAL EXAM: Blood pressure 109/74, pulse 66, temperature 98.3 F (36.8 C), temperature source Oral, height 5\' 2"  (1.575 m), weight 198 lb (89.8 kg), SpO2 98 %. Body mass index is 36.21 kg/m. Physical Exam  Constitutional: She is oriented to person, place,  and time. She appears well-developed and well-nourished.  Cardiovascular: Normal rate.   Pulmonary/Chest: Effort normal.  Musculoskeletal: Normal range of motion.  Neurological: She is oriented to person, place, and time.  Skin: Skin is warm and dry.  Psychiatric: She has a normal mood and affect. Her behavior is normal.  Vitals reviewed.   RECENT LABS AND TESTS: BMET    Component Value Date/Time   NA 141 10/08/2016 1025   K 4.3 10/08/2016 1025   CL 101 10/08/2016 1025   CO2 25 10/08/2016 1025   GLUCOSE 85 10/08/2016 1025   GLUCOSE 83 05/27/2012 1119   BUN 13 10/08/2016 1025   CREATININE 0.74 10/08/2016 1025   CREATININE 0.81 05/27/2012 1119   CALCIUM 9.1 10/08/2016 1025   GFRNONAA 96 10/08/2016 1025   GFRAA 111 10/08/2016 1025   Lab Results  Component Value Date   HGBA1C 5.3 10/08/2016   HGBA1C 5.7 (H) 06/25/2016   Lab Results  Component Value Date   INSULIN 9.7 10/08/2016   INSULIN 12.7 06/25/2016   CBC    Component Value Date/Time   WBC 8.3 06/25/2016 1053   WBC 6.7 05/27/2012 1119   RBC 4.15 06/25/2016 1053   RBC 4.46 05/27/2012 1119   HGB 12.2 06/25/2016 1053   HCT 37.1 06/25/2016 1053   PLT 317 05/27/2012 1119   MCV 89 06/25/2016 1053   MCH 29.4 06/25/2016 1053   MCH 30.7 05/27/2012 1119   MCHC 32.9 06/25/2016 1053   MCHC 33.7 05/27/2012 1119   RDW 14.9 06/25/2016 1053   LYMPHSABS 2.4 06/25/2016 1053   MONOABS 0.4 05/27/2012 1119   EOSABS 0.2 06/25/2016 1053   BASOSABS 0.0 06/25/2016 1053   Iron/TIBC/Ferritin/ %Sat No results found for: IRON, TIBC, FERRITIN, IRONPCTSAT Lipid Panel     Component Value Date/Time   CHOL 178 10/08/2016 1025   TRIG 75 10/08/2016 1025   HDL 39 (L) 10/08/2016 1025   LDLCALC 124 (H) 10/08/2016 1025   Hepatic Function Panel     Component Value Date/Time   PROT 6.9 10/08/2016 1025   ALBUMIN 4.3 10/08/2016 1025   AST 18 10/08/2016 1025   ALT 16 10/08/2016 1025   ALKPHOS 66 10/08/2016 1025   BILITOT 0.3 10/08/2016  1025      Component Value Date/Time   TSH 1.500 10/08/2016 1025   TSH 1.630 06/25/2016 1053    ASSESSMENT AND PLAN: Prediabetes - Plan: metFORMIN (GLUCOPHAGE) 500 MG tablet  Other hyperlipidemia  Class 2 obesity with serious comorbidity and body mass index (BMI) of 36.0 to 36.9 in adult, unspecified obesity type  PLAN:  Pre-Diabetes Tiannah will continue to work on weight loss, exercise, and decreasing simple carbohydrates in her diet to help decrease the risk of diabetes. We dicussed metformin including benefits and risks. She was informed that eating too many simple  carbohydrates or too many calories at one sitting increases the likelihood of GI side effects. Canyon agreed to continue metformin for now and a prescription was written today for metformin 500 mg bid #60 with no refills. Helyn agreed to follow up with [name] as directed to monitor her progress.  Hyperlipidemia Maryfer was informed of the American Heart Association Guidelines emphasizing intensive lifestyle modifications as the first line treatment for hyperlipidemia. We discussed many lifestyle modifications today in depth, and Jen will continue to work on decreasing saturated fats such as fatty red meat, butter and many fried foods. She will also increase vegetables and lean protein in her diet and continue to work on exercise and weight loss efforts. Li agrees to follow up with our clinic in 2 to 3 weeks.  Obesity Tanique is currently in the action stage of change. As such, her goal is to continue with weight loss efforts She has agreed to keep a food journal with 1200 calories and 85+ grams of protein daily Vivian has been instructed to work up to a goal of 150 minutes of combined cardio and strengthening exercise per week for weight loss and overall health benefits. We discussed the following Behavioral Modification Strategies today: increasing lean protein intake and work on meal planning and easy cooking  plans  Aryan has agreed to follow up with our clinic in 2 to 3 weeks. She was informed of the importance of frequent follow up visits to maximize her success with intensive lifestyle modifications for her multiple health conditions.  I, Doreene Nest, am acting as transcriptionist for Lacy Duverney, PA-C  I have reviewed the above documentation for accuracy and completeness, and I agree with the above. -Lacy Duverney, PA-C  I have reviewed the above note and agree with the plan. -Dennard Nip, MD  OBESITY BEHAVIORAL INTERVENTION VISIT  Today's visit was # 9 out of 22.  Starting weight: 219 lbs Starting date: 06/25/16 Today's weight : 198 lbs  Today's date: 10/22/2016 Total lbs lost to date: 21 (Patients must lose 7 lbs in the first 6 months to continue with counseling)   ASK: We discussed the diagnosis of obesity with Alben Spittle today and Temprence agreed to give [name] permission to discuss obesity behavioral modification therapy today.  ASSESS: Xylia has the diagnosis of obesity and her BMI today is 36.21 Janelli is in the action stage of change   ADVISE: Harneet was educated on the multiple health risks of obesity as well as the benefit of weight loss to improve her health. She was advised of the need for long term treatment and the importance of lifestyle modifications.  AGREE: Multiple dietary modification options and treatment options were discussed and  Ailynn agreed to keep a food journal with 1200 calories and 85+ grams of protein daily We discussed the following Behavioral Modification Strategies today: increasing lean protein intake and work on meal planning and easy cooking plans

## 2016-10-29 ENCOUNTER — Ambulatory Visit: Payer: BC Managed Care – PPO | Admitting: Family Medicine

## 2016-10-30 ENCOUNTER — Ambulatory Visit: Payer: BC Managed Care – PPO | Admitting: Family Medicine

## 2016-11-10 ENCOUNTER — Ambulatory Visit (INDEPENDENT_AMBULATORY_CARE_PROVIDER_SITE_OTHER): Payer: BC Managed Care – PPO | Admitting: Physician Assistant

## 2016-11-10 VITALS — BP 119/81 | HR 62 | Temp 98.3°F | Ht 62.0 in | Wt 197.0 lb

## 2016-11-10 DIAGNOSIS — E559 Vitamin D deficiency, unspecified: Secondary | ICD-10-CM | POA: Diagnosis not present

## 2016-11-10 DIAGNOSIS — Z6836 Body mass index (BMI) 36.0-36.9, adult: Secondary | ICD-10-CM

## 2016-11-10 NOTE — Progress Notes (Signed)
Office: [phone]  /  Fax: [phone]   HPI:   Chief Complaint: OBESITY Grace Horton is here to discuss her progress with her obesity treatment plan. She is on the  keep a food journal with 1200 calories and 85+ grams of protein daily and is following her eating plan approximately 50 % of the time. She states she is exercising 0 minutes 0 times per week. Grace Horton has done well with weight loss, despite not being home and was out of town more. Her weight is 197 lb (89.4 kg) today and has had a weight loss of 1 pound over a period of 3 weeks since her last visit. She has lost 22 lbs since starting treatment with [name].  Vitamin D deficiency Grace Horton has a diagnosis of vitamin D deficiency. She is currently taking vit D and vit D is at 18, which is above goal. She denies nausea, vomiting or muscle weakness.   ALLERGIES: No Known Allergies  MEDICATIONS: Current Outpatient Prescriptions on File Prior to Visit  Medication Sig Dispense Refill  . Coenzyme Q10 10 MG capsule Take 100 mg by mouth daily.    . Diclofenac Sodium (PENNSAID) 2 % SOLN Fingertip amount to affected area twice daily 1 Bottle 2  . hydrochlorothiazide (HYDRODIURIL) 25 MG tablet Take 25 mg by mouth daily.    Marland Kitchen levonorgestrel-ethinyl estradiol (AVIANE,ALESSE,LESSINA) 0.1-20 MG-MCG tablet Take 1 tablet by mouth daily.    . magnesium 30 MG tablet Take 1,250 mg by mouth at bedtime.    . metFORMIN (GLUCOPHAGE) 500 MG tablet Take 1 tablet (500 mg total) by mouth 2 (two) times daily with a meal. 60 tablet 0  . Turmeric 450 MG CAPS Take by mouth at bedtime.    . Vitamin D, Ergocalciferol, (DRISDOL) 50000 units CAPS capsule Take 1 capsule (50,000 Units total) by mouth every 7 (seven) days. 4 capsule 0  . Vitamin D, Ergocalciferol, (DRISDOL) 50000 units CAPS capsule Take 1 capsule (50,000 Units total) by mouth every 7 (seven) days. 12 capsule 0   No current facility-administered medications on file prior to visit.     PAST MEDICAL  HISTORY: Past Medical History:  Diagnosis Date  . AC (acromioclavicular) joint bone spurs   . Anxiety   . Back pain   . Chondromalacia   . Depression   . GERD (gastroesophageal reflux disease)   . History of shingles 2011   Around Left Eye  . Joint pain   . Raynaud disease   . Swelling    in feet or legs  . Vitamin D deficiency     PAST SURGICAL HISTORY: Past Surgical History:  Procedure Laterality Date  . CESAREAN SECTION    . SHOULDER SURGERY Left 2007  . TONSILLECTOMY      SOCIAL HISTORY: Social History  Substance Use Topics  . Smoking status: Never Smoker  . Smokeless tobacco: Never Used  . Alcohol use No    FAMILY HISTORY: Family History  Problem Relation Age of Onset  . Heart disease Father   . Diabetes Father   . Kidney disease Father        Stage III   . Transient ischemic attack Father        S/P Carotid endarterectomy  . Hypertension Father   . Hyperlipidemia Father   . Irritable bowel syndrome Mother   . Hypertension Mother   . Hyperlipidemia Mother   . Obesity Mother     ROS: Review of Systems  Constitutional: Positive for weight loss.  Gastrointestinal:  Negative for nausea and vomiting.  Musculoskeletal:       Negative muscle weakness    PHYSICAL EXAM: Blood pressure 119/81, pulse 62, temperature 98.3 F (36.8 C), temperature source Oral, height 5\' 2"  (1.575 m), weight 197 lb (89.4 kg), SpO2 98 %. Body mass index is 36.03 kg/m. Physical Exam  Constitutional: She is oriented to person, place, and time. She appears well-developed and well-nourished.  Cardiovascular: Normal rate.   Pulmonary/Chest: Effort normal.  Musculoskeletal: Normal range of motion.  Neurological: She is oriented to person, place, and time.  Skin: Skin is warm and dry.  Psychiatric: She has a normal mood and affect. Her behavior is normal.  Vitals reviewed.   RECENT LABS AND TESTS: BMET    Component Value Date/Time   NA 141 10/08/2016 1025   K 4.3  10/08/2016 1025   CL 101 10/08/2016 1025   CO2 25 10/08/2016 1025   GLUCOSE 85 10/08/2016 1025   GLUCOSE 83 05/27/2012 1119   BUN 13 10/08/2016 1025   CREATININE 0.74 10/08/2016 1025   CREATININE 0.81 05/27/2012 1119   CALCIUM 9.1 10/08/2016 1025   GFRNONAA 96 10/08/2016 1025   GFRAA 111 10/08/2016 1025   Lab Results  Component Value Date   HGBA1C 5.3 10/08/2016   HGBA1C 5.7 (H) 06/25/2016   Lab Results  Component Value Date   INSULIN 9.7 10/08/2016   INSULIN 12.7 06/25/2016   CBC    Component Value Date/Time   WBC 8.3 06/25/2016 1053   WBC 6.7 05/27/2012 1119   RBC 4.15 06/25/2016 1053   RBC 4.46 05/27/2012 1119   HGB 12.2 06/25/2016 1053   HCT 37.1 06/25/2016 1053   PLT 317 05/27/2012 1119   MCV 89 06/25/2016 1053   MCH 29.4 06/25/2016 1053   MCH 30.7 05/27/2012 1119   MCHC 32.9 06/25/2016 1053   MCHC 33.7 05/27/2012 1119   RDW 14.9 06/25/2016 1053   LYMPHSABS 2.4 06/25/2016 1053   MONOABS 0.4 05/27/2012 1119   EOSABS 0.2 06/25/2016 1053   BASOSABS 0.0 06/25/2016 1053   Iron/TIBC/Ferritin/ %Sat No results found for: IRON, TIBC, FERRITIN, IRONPCTSAT Lipid Panel     Component Value Date/Time   CHOL 178 10/08/2016 1025   TRIG 75 10/08/2016 1025   HDL 39 (L) 10/08/2016 1025   LDLCALC 124 (H) 10/08/2016 1025   Hepatic Function Panel     Component Value Date/Time   PROT 6.9 10/08/2016 1025   ALBUMIN 4.3 10/08/2016 1025   AST 18 10/08/2016 1025   ALT 16 10/08/2016 1025   ALKPHOS 66 10/08/2016 1025   BILITOT 0.3 10/08/2016 1025      Component Value Date/Time   TSH 1.500 10/08/2016 1025   TSH 1.630 06/25/2016 1053    ASSESSMENT AND PLAN: Vitamin D deficiency  Class 2 severe obesity with serious comorbidity and body mass index (BMI) of 36.0 to 36.9 in adult, unspecified obesity type (HCC)  PLAN:  Vitamin D Deficiency Grace Horton was informed that low vitamin D levels contributes to fatigue and are associated with obesity, breast, and colon cancer. She  is informed that her vit D levels are now at goal, and she is instructed to stop taking prescription Vit D  50, 000 and to continue to take a daily multivitamin with Vit D. We will follow up for routine testing of vitamin D, at least 2-3 times per year. She was informed of the risk of over-replacement of vitamin D and agrees to stop taking 50,000 dose of Vit D, unless  she discusses this with [name] first.  We spent > than 50% of the 15 minute visit on the counseling as documented in the note.   Obesity Grace Horton is currently in the action stage of change. As such, her goal is to continue with weight loss efforts She has agreed to keep a food journal with 1200 calories and 85 grams of protein daily Grace Horton has been instructed to work up to a goal of 150 minutes of combined cardio and strengthening exercise per week for weight loss and overall health benefits. We discussed the following Behavioral Modification Strategies today: increasing lean protein intake and work on meal planning and easy cooking plans  Grace Horton has agreed to follow up with our clinic in 2 to 3 weeks. She was informed of the importance of frequent follow up visits to maximize her success with intensive lifestyle modifications for her multiple health conditions.  I, Doreene Nest, am acting as transcriptionist for Lacy Duverney, PA-C  I have reviewed the above documentation for accuracy and completeness, and I agree with the above. -Lacy Duverney, PA-C  I have reviewed the above note and agree with the plan. -Dennard Nip, MD   OBESITY BEHAVIORAL INTERVENTION VISIT  Today's visit was # 10 out of 22.  Starting weight: 219 lbs Starting date: 06/25/16 Today's weight : 197 lbs  Today's date: 11/10/2016 Total lbs lost to date: 22 (Patients must lose 7 lbs in the first 6 months to continue with counseling)   ASK: We discussed the diagnosis of obesity with Grace Horton today and Grace Horton agreed to give [name] permission to discuss obesity  behavioral modification therapy today.  ASSESS: Grace Horton has the diagnosis of obesity and her BMI today is 36.02 Grace Horton is in the action stage of change   ADVISE: Grace Horton was educated on the multiple health risks of obesity as well as the benefit of weight loss to improve her health. She was advised of the need for long term treatment and the importance of lifestyle modifications.  AGREE: Multiple dietary modification options and treatment options were discussed and  Grace Horton agreed to keep a food journal with 1200 calories and 85 grams of protein daily We discussed the following Behavioral Modification Strategies today: increasing lean protein intake and work on meal planning and easy cooking plans

## 2016-12-04 ENCOUNTER — Ambulatory Visit (INDEPENDENT_AMBULATORY_CARE_PROVIDER_SITE_OTHER): Payer: BC Managed Care – PPO | Admitting: Physician Assistant

## 2016-12-04 VITALS — BP 116/73 | HR 68 | Temp 98.2°F | Ht 62.0 in | Wt 196.0 lb

## 2016-12-04 DIAGNOSIS — Z9189 Other specified personal risk factors, not elsewhere classified: Secondary | ICD-10-CM

## 2016-12-04 DIAGNOSIS — L853 Xerosis cutis: Secondary | ICD-10-CM | POA: Diagnosis not present

## 2016-12-04 DIAGNOSIS — Z6835 Body mass index (BMI) 35.0-35.9, adult: Secondary | ICD-10-CM

## 2016-12-04 DIAGNOSIS — R7303 Prediabetes: Secondary | ICD-10-CM | POA: Diagnosis not present

## 2016-12-04 MED ORDER — EUCERIN EX CREA: TOPICAL_CREAM | CUTANEOUS | 0 refills | Status: DC | PRN

## 2016-12-04 NOTE — Progress Notes (Signed)
Office: [phone]  /  Fax: [phone]   HPI:   Chief Complaint: OBESITY Cloa is here to discuss her progress with her obesity treatment plan. She is on the keep a food journal with 1200 calories and 85 grams of protein daily and is following her eating plan approximately 95-100 % of the time. She states she is walking 50-60 minutes 3 times per week. Dandrea continues to do well with weight loss. She is mindful of her food choices and plans her meals well.  Her weight is 196 lb (88.9 kg) today and has had a weight loss of 1 pound over a period of 3 weeks since her last visit. She has lost 23 lbs since starting treatment with [name].  Pre-Diabetes Mattingly has a diagnosis of pre-diabetes based on her elevated Hgb A1c and was informed this puts her at greater risk of developing diabetes. She is taking metformin currently and continues to work on diet and exercise to decrease risk of diabetes. She states she wants to stop metformin. She denies nausea, polyphagia or hypoglycemia.  At risk for diabetes Enriqueta is at higher than averagerisk for developing diabetes due to her obesity. She currently denies polyuria or polydipsia.   Dry skin Naquisha complains of areas of dry, rough skin associated with some burning, on her bilateral eyelids and on her L cheek for past couple of weeks not associated with a rash. She  denies any change in body products, or soaps, and denies any exacerbating/alleviating factors or any similar episodes in the past. States she has had shingles in the past, which presented different, was unilateral and was associated with a rash, which is not similar to current episode.     ALLERGIES: No Known Allergies  MEDICATIONS: Current Outpatient Prescriptions on File Prior to Visit  Medication Sig Dispense Refill  . Coenzyme Q10 10 MG capsule Take 100 mg by mouth daily.    . Diclofenac Sodium (PENNSAID) 2 % SOLN Fingertip amount to affected area twice daily 1 Bottle 2  .  hydrochlorothiazide (HYDRODIURIL) 25 MG tablet Take 25 mg by mouth daily.    Marland Kitchen levonorgestrel-ethinyl estradiol (AVIANE,ALESSE,LESSINA) 0.1-20 MG-MCG tablet Take 1 tablet by mouth daily.    . magnesium 30 MG tablet Take 1,250 mg by mouth at bedtime.    . metFORMIN (GLUCOPHAGE) 500 MG tablet Take 1 tablet (500 mg total) by mouth 2 (two) times daily with a meal. 60 tablet 0  . Turmeric 450 MG CAPS Take by mouth at bedtime.    . Vitamin D, Ergocalciferol, (DRISDOL) 50000 units CAPS capsule Take 1 capsule (50,000 Units total) by mouth every 7 (seven) days. 4 capsule 0  . Vitamin D, Ergocalciferol, (DRISDOL) 50000 units CAPS capsule Take 1 capsule (50,000 Units total) by mouth every 7 (seven) days. 12 capsule 0   No current facility-administered medications on file prior to visit.     PAST MEDICAL HISTORY: Past Medical History:  Diagnosis Date  . AC (acromioclavicular) joint bone spurs   . Anxiety   . Back pain   . Chondromalacia   . Depression   . GERD (gastroesophageal reflux disease)   . History of shingles 2011   Around Left Eye  . Joint pain   . Raynaud disease   . Swelling    in feet or legs  . Vitamin D deficiency     PAST SURGICAL HISTORY: Past Surgical History:  Procedure Laterality Date  . CESAREAN SECTION    . SHOULDER SURGERY Left 2007  .  TONSILLECTOMY      SOCIAL HISTORY: Social History  Substance Use Topics  . Smoking status: Never Smoker  . Smokeless tobacco: Never Used  . Alcohol use No    FAMILY HISTORY: Family History  Problem Relation Age of Onset  . Heart disease Father   . Diabetes Father   . Kidney disease Father        Stage III   . Transient ischemic attack Father        S/P Carotid endarterectomy  . Hypertension Father   . Hyperlipidemia Father   . Irritable bowel syndrome Mother   . Hypertension Mother   . Hyperlipidemia Mother   . Obesity Mother     ROS: Review of Systems  Constitutional: Positive for weight loss.    Gastrointestinal: Negative for nausea.  Skin: Positive for rash. Negative for itching.       Patchy rough dry rash associated with burning.  Endo/Heme/Allergies:       Negative polyphagia Negative hypoglycemia    PHYSICAL EXAM: Blood pressure 116/73, pulse 68, temperature 98.2 F (36.8 C), temperature source Oral, height 5\' 2"  (1.575 m), weight 196 lb (88.9 kg), SpO2 98 %. Body mass index is 35.85 kg/m. Physical Exam  Constitutional: She is oriented to person, place, and time. She appears well-developed and well-nourished.  Eyes: Pupils are equal, round, and reactive to light. Conjunctivae are normal. Right eye exhibits no discharge. Left eye exhibits no discharge. No scleral icterus.  Patch area of rough dry skin on bilateral upper eyelids and on L cheek, no associated with vesicular rash, redness or swelling.  Cardiovascular: Normal rate.   Pulmonary/Chest: Effort normal.  Musculoskeletal: Normal range of motion.  Neurological: She is oriented to person, place, and time.  Skin: Skin is warm and dry.  Psychiatric: She has a normal mood and affect. Her behavior is normal.  Vitals reviewed.   RECENT LABS AND TESTS: BMET    Component Value Date/Time   NA 141 10/08/2016 1025   K 4.3 10/08/2016 1025   CL 101 10/08/2016 1025   CO2 25 10/08/2016 1025   GLUCOSE 85 10/08/2016 1025   GLUCOSE 83 05/27/2012 1119   BUN 13 10/08/2016 1025   CREATININE 0.74 10/08/2016 1025   CREATININE 0.81 05/27/2012 1119   CALCIUM 9.1 10/08/2016 1025   GFRNONAA 96 10/08/2016 1025   GFRAA 111 10/08/2016 1025   Lab Results  Component Value Date   HGBA1C 5.3 10/08/2016   HGBA1C 5.7 (H) 06/25/2016   Lab Results  Component Value Date   INSULIN 9.7 10/08/2016   INSULIN 12.7 06/25/2016   CBC    Component Value Date/Time   WBC 8.3 06/25/2016 1053   WBC 6.7 05/27/2012 1119   RBC 4.15 06/25/2016 1053   RBC 4.46 05/27/2012 1119   HGB 12.2 06/25/2016 1053   HCT 37.1 06/25/2016 1053   PLT 317  05/27/2012 1119   MCV 89 06/25/2016 1053   MCH 29.4 06/25/2016 1053   MCH 30.7 05/27/2012 1119   MCHC 32.9 06/25/2016 1053   MCHC 33.7 05/27/2012 1119   RDW 14.9 06/25/2016 1053   LYMPHSABS 2.4 06/25/2016 1053   MONOABS 0.4 05/27/2012 1119   EOSABS 0.2 06/25/2016 1053   BASOSABS 0.0 06/25/2016 1053   Iron/TIBC/Ferritin/ %Sat No results found for: IRON, TIBC, FERRITIN, IRONPCTSAT Lipid Panel     Component Value Date/Time   CHOL 178 10/08/2016 1025   TRIG 75 10/08/2016 1025   HDL 39 (L) 10/08/2016 1025   LDLCALC 124 (  H) 10/08/2016 1025   Hepatic Function Panel     Component Value Date/Time   PROT 6.9 10/08/2016 1025   ALBUMIN 4.3 10/08/2016 1025   AST 18 10/08/2016 1025   ALT 16 10/08/2016 1025   ALKPHOS 66 10/08/2016 1025   BILITOT 0.3 10/08/2016 1025      Component Value Date/Time   TSH 1.500 10/08/2016 1025   TSH 1.630 06/25/2016 1053    ASSESSMENT AND PLAN: Dry skin - Plan: Skin Protectants, Misc. (EUCERIN) cream  Prediabetes  At risk for diabetes mellitus  Class 2 severe obesity with serious comorbidity and body mass index (BMI) of 35.0 to 35.9 in adult, unspecified obesity type ([place])  PLAN:  Pre-Diabetes Twala will continue to work on weight loss, diet, exercise, and decreasing simple carbohydrates in her diet to help decrease the risk of diabetes. We dicussed metformin including benefits and risks. She was informed that eating too many simple carbohydrates or too many calories at one sitting increases the likelihood of GI side effects. Kaitlyne . Emrie agrees to follow up with our clinic in 2 to 3 weeks as directed to monitor her progress.   Dry  Skin Merina is informed that she likely has dry skin and maintaining moist skin is mainstay treatment. She has been prescribed Eucerin Eczema cream, to be used PRN over areas of dry skin. She is informed that burning sensation could be a sign of shingles, even if she does not have typical vesicular rash of shingles.  However, given areas of burning are bilateral, this is unlikely shingles. She is informed that any change in appearance of the dry rough patches, or  development of a new rash, or any other new symptoms, she is to have further evaluation of the rash. Aaminah agrees to the plan and agrees to follow up with [name] as planned.   Diabetes risk counselling Shelma was given extended (15 minutes) diabetes prevention counseling today. She is 46 y.o. female and has risk factors for diabetes including obesity. We discussed intensive lifestyle modifications today with an emphasis on weight loss as well as increasing exercise and decreasing simple carbohydrates in her diet.   Obesity Manha is currently in the action stage of change. As such, her goal is to continue with weight loss efforts She has agreed to keep a food journal with 1200 calories and 95 grams of protein  Mylynn has been instructed to work up to a goal of 150 minutes of combined cardio and strengthening exercise per week for weight loss and overall health benefits. We discussed the following Behavioral Modification Strategies today: increasing lean protein intake and travel eating strategies.   Azarah has agreed to follow up with our clinic in 2 to 3 weeks. She was informed of the importance of frequent follow up visits to maximize her success with intensive lifestyle modifications for her multiple health conditions.  I, Trixie Dredge, am acting as transcriptionist for Lacy Duverney, PA-C  I have reviewed the above documentation for accuracy and completeness, and I agree with the above. -Lacy Duverney, PA-C  I have reviewed the above note and agree with the plan. -Dennard Nip, MD     Today's visit was # 11 out of 22.  Starting weight: 219 lbs Starting date: 06/25/16 Today's weight : 196 lbs  Today's date: 12/04/2016 Total lbs lost to date: 23 (Patients must lose 7 lbs in the first 6 months to continue with counseling)   ASK: We  discussed the diagnosis of obesity with Kloi  Aleene Davidson today and Leasha agreed to give [name] permission to discuss obesity behavioral modification therapy today.  ASSESS: Aly has the diagnosis of obesity and her BMI today is 35.84 Dallana is in the action stage of change   ADVISE: Laynee was educated on the multiple health risks of obesity as well as the benefit of weight loss to improve her health. She was advised of the need for long term treatment and the importance of lifestyle modifications.  AGREE: Multiple dietary modification options and treatment options were discussed and  Zharia agreed to keep a food journal with 1200 calories and 95 grams of protein daily We discussed the following Behavioral Modification Strategies today: increasing lean protein intake and travel eating strategies.

## 2016-12-22 ENCOUNTER — Ambulatory Visit (INDEPENDENT_AMBULATORY_CARE_PROVIDER_SITE_OTHER): Payer: BC Managed Care – PPO | Admitting: Physician Assistant

## 2016-12-22 VITALS — BP 111/74 | HR 69 | Temp 98.5°F | Ht 62.0 in | Wt 197.0 lb

## 2016-12-22 DIAGNOSIS — E7849 Other hyperlipidemia: Secondary | ICD-10-CM

## 2016-12-22 DIAGNOSIS — Z6836 Body mass index (BMI) 36.0-36.9, adult: Secondary | ICD-10-CM | POA: Diagnosis not present

## 2016-12-22 DIAGNOSIS — R7303 Prediabetes: Secondary | ICD-10-CM

## 2016-12-22 MED ORDER — METFORMIN HCL 500 MG PO TABS: 500.0000 mg | ORAL_TABLET | Freq: Two times a day (BID) | ORAL | 0 refills | Status: DC

## 2016-12-22 NOTE — Progress Notes (Signed)
Office: [phone]  /  Fax: [phone]   HPI:   Chief Complaint: OBESITY Grace Horton is here to discuss her progress with her obesity treatment plan. She is on the keep a food journal with 1200 calories and 95 grams of protein daily and is following her eating plan approximately 90-95 % of the time. She states she is exercising 0 minutes 0 times per week. Grace Horton continues to make mindful food choices and controls her portions well. She has been meeting her protein goal, but exceeds her daily calorie goal on most days. She has stopped taking Metformin, and states she has noticed increase in hunger in the evening. She would like more meal planning ideas. Her weight is 197 lb (89.4 kg) today and has gained 1 pound since her last visit. She has lost 22 lbs since starting treatment with [name].  Pre-Diabetes Grace Horton has a diagnosis of pre-diabetes based on her elevated Hgb A1c and was informed this puts her at greater risk of developing diabetes. She is not taking metformin currently and continues to work on diet and exercise to decrease risk of diabetes. She admits to polyphagia. She denies nausea or hypoglycemia.  Hyperlipidemia Grace Horton has hyperlipidemia and has been trying to improve her cholesterol levels with intensive lifestyle modification including a low saturated fat diet, exercise and weight loss. She is not on statin and she declines any hyperlipidemia medicine today. She denies any chest pain, claudication or myalgias.  ALLERGIES: No Known Allergies  MEDICATIONS: Current Outpatient Medications on File Prior to Visit  Medication Sig Dispense Refill  . Coenzyme Q10 10 MG capsule Take 100 mg by mouth daily.    . Diclofenac Sodium (PENNSAID) 2 % SOLN Fingertip amount to affected area twice daily 1 Bottle 2  . hydrochlorothiazide (HYDRODIURIL) 25 MG tablet Take 25 mg by mouth daily.    Marland Kitchen levonorgestrel-ethinyl estradiol (AVIANE,ALESSE,LESSINA) 0.1-20 MG-MCG tablet Take 1 tablet by mouth  daily.    . Skin Protectants, Misc. (EUCERIN) cream Apply topically as needed for dry skin. 454 g 0  . Turmeric 450 MG CAPS Take by mouth at bedtime.    . Vitamin D, Ergocalciferol, (DRISDOL) 50000 units CAPS capsule Take 1 capsule (50,000 Units total) by mouth every 7 (seven) days. 4 capsule 0   No current facility-administered medications on file prior to visit.     PAST MEDICAL HISTORY: Past Medical History:  Diagnosis Date  . AC (acromioclavicular) joint bone spurs   . Anxiety   . Back pain   . Chondromalacia   . Depression   . GERD (gastroesophageal reflux disease)   . History of shingles 2011   Around Left Eye  . Joint pain   . Raynaud disease   . Swelling    in feet or legs  . Vitamin D deficiency     PAST SURGICAL HISTORY: Past Surgical History:  Procedure Laterality Date  . CESAREAN SECTION    . SHOULDER SURGERY Left 2007  . TONSILLECTOMY      SOCIAL HISTORY: Social History   Tobacco Use  . Smoking status: Never Smoker  . Smokeless tobacco: Never Used  Substance Use Topics  . Alcohol use: No  . Drug use: No    FAMILY HISTORY: Family History  Problem Relation Age of Onset  . Heart disease Father   . Diabetes Father   . Kidney disease Father        Stage III   . Transient ischemic attack Father  S/P Carotid endarterectomy  . Hypertension Father   . Hyperlipidemia Father   . Irritable bowel syndrome Mother   . Hypertension Mother   . Hyperlipidemia Mother   . Obesity Mother     ROS: Review of Systems  Constitutional: Negative for weight loss.  Cardiovascular: Negative for chest pain and claudication.  Gastrointestinal: Negative for nausea.  Musculoskeletal: Negative for myalgias.  Endo/Heme/Allergies:       Negative polyphagia Negative hypoglycemia    PHYSICAL EXAM: Blood pressure 111/74, pulse 69, temperature 98.5 F (36.9 C), temperature source Oral, height 5\' 2"  (1.575 m), weight 197 lb (89.4 kg), SpO2 98 %. Body mass index is  36.03 kg/m. Physical Exam  Constitutional: She is oriented to person, place, and time. She appears well-developed and well-nourished.  Cardiovascular: Normal rate.  Pulmonary/Chest: Effort normal.  Musculoskeletal: Normal range of motion.  Neurological: She is oriented to person, place, and time.  Skin: Skin is warm and dry.  Psychiatric: She has a normal mood and affect. Her behavior is normal.  Vitals reviewed.   RECENT LABS AND TESTS: BMET    Component Value Date/Time   NA 141 10/08/2016 1025   K 4.3 10/08/2016 1025   CL 101 10/08/2016 1025   CO2 25 10/08/2016 1025   GLUCOSE 85 10/08/2016 1025   GLUCOSE 83 05/27/2012 1119   BUN 13 10/08/2016 1025   CREATININE 0.74 10/08/2016 1025   CREATININE 0.81 05/27/2012 1119   CALCIUM 9.1 10/08/2016 1025   GFRNONAA 96 10/08/2016 1025   GFRAA 111 10/08/2016 1025   Lab Results  Component Value Date   HGBA1C 5.3 10/08/2016   HGBA1C 5.7 (H) 06/25/2016   Lab Results  Component Value Date   INSULIN 9.7 10/08/2016   INSULIN 12.7 06/25/2016   CBC    Component Value Date/Time   WBC 8.3 06/25/2016 1053   WBC 6.7 05/27/2012 1119   RBC 4.15 06/25/2016 1053   RBC 4.46 05/27/2012 1119   HGB 12.2 06/25/2016 1053   HCT 37.1 06/25/2016 1053   PLT 317 05/27/2012 1119   MCV 89 06/25/2016 1053   MCH 29.4 06/25/2016 1053   MCH 30.7 05/27/2012 1119   MCHC 32.9 06/25/2016 1053   MCHC 33.7 05/27/2012 1119   RDW 14.9 06/25/2016 1053   LYMPHSABS 2.4 06/25/2016 1053   MONOABS 0.4 05/27/2012 1119   EOSABS 0.2 06/25/2016 1053   BASOSABS 0.0 06/25/2016 1053   Iron/TIBC/Ferritin/ %Sat No results found for: IRON, TIBC, FERRITIN, IRONPCTSAT Lipid Panel     Component Value Date/Time   CHOL 178 10/08/2016 1025   TRIG 75 10/08/2016 1025   HDL 39 (L) 10/08/2016 1025   LDLCALC 124 (H) 10/08/2016 1025   Hepatic Function Panel     Component Value Date/Time   PROT 6.9 10/08/2016 1025   ALBUMIN 4.3 10/08/2016 1025   AST 18 10/08/2016 1025    ALT 16 10/08/2016 1025   ALKPHOS 66 10/08/2016 1025   BILITOT 0.3 10/08/2016 1025      Component Value Date/Time   TSH 1.500 10/08/2016 1025   TSH 1.630 06/25/2016 1053    ASSESSMENT AND PLAN: Prediabetes - Plan: metFORMIN (GLUCOPHAGE) 500 MG tablet  Other hyperlipidemia  Class 2 severe obesity with serious comorbidity and body mass index (BMI) of 36.0 to 36.9 in adult, unspecified obesity type (Grace Horton)  PLAN:  Pre-Diabetes Grace Horton will continue to work on weight loss, exercise, and decreasing simple carbohydrates in her diet to help decrease the risk of diabetes. We dicussed metformin including  benefits and risks. She was informed that eating too many simple carbohydrates or too many calories at one sitting increases the likelihood of GI side effects. Grace Horton agrees to restart metformin 500 mg BID #60 with no refills. Grace Horton agrees to follow up with our clinic in 2 to 3 weeks as directed to monitor her progress.  Hyperlipidemia Grace Horton was informed of the American Heart Association Guidelines emphasizing intensive lifestyle modifications as the first line treatment for hyperlipidemia. We discussed many lifestyle modifications today in depth, and Grace Horton will continue to work on decreasing saturated fats such as fatty red meat, butter and many fried foods. She will also increase vegetables and lean protein in her diet and continue to work on diet, exercise, and weight loss efforts. Grace Horton agrees to follow up with our clinic in 2 to 3 weeks.  Obesity Grace Horton is currently in the action stage of change. As such, her goal is to continue with weight loss efforts She has agreed to keep a food journal with 1200 calories and 95 grams of protein daily Grace Horton has been instructed to work up to a goal of 150 minutes of combined cardio and strengthening exercise per week for weight loss and overall health benefits. We discussed the following Behavioral Modification Strategies today: increasing lean protein  intake and work on meal planning and easy cooking plans   Grace Horton has agreed to follow up with our clinic in 2 to 3 weeks. She was informed of the importance of frequent follow up visits to maximize her success with intensive lifestyle modifications for her multiple health conditions.  I, Grace Horton, am acting as transcriptionist for Grace Duverney, PA-C  I have reviewed the above documentation for accuracy and completeness, and I agree with the above. -Grace Duverney, PA-C  I have reviewed the above note and agree with the plan. -Grace Nip, MD      Today's visit was # 12 out of 22.  Starting weight: 219 lbs Starting date: 06/25/16 Today's weight : 197 lbs  Today's date: 12/23/2016 Total lbs lost to date: 22 (Patients must lose 7 lbs in the first 6 months to continue with counseling)   ASK: We discussed the diagnosis of obesity with Grace Horton today and Grace Horton agreed to give [name] permission to discuss obesity behavioral modification therapy today.  ASSESS: Grace Horton has the diagnosis of obesity and her BMI today is 36.02 Grace Horton is in the action stage of change   ADVISE: Grace Horton was educated on the multiple health risks of obesity as well as the benefit of weight loss to improve her health. She was advised of the need for long term treatment and the importance of lifestyle modifications.  AGREE: Multiple dietary modification options and treatment options were discussed and  Grace Horton agreed to keep a food journal with 1200 calories and 95 grams of protein daily We discussed the following Behavioral Modification Strategies today: increasing lean protein intake and work on meal planning and easy cooking plans

## 2017-01-12 ENCOUNTER — Ambulatory Visit (INDEPENDENT_AMBULATORY_CARE_PROVIDER_SITE_OTHER): Payer: BC Managed Care – PPO | Admitting: Physician Assistant

## 2017-01-12 VITALS — BP 111/75 | HR 68 | Temp 98.4°F | Ht 62.0 in | Wt 200.0 lb

## 2017-01-12 DIAGNOSIS — Z6836 Body mass index (BMI) 36.0-36.9, adult: Secondary | ICD-10-CM | POA: Diagnosis not present

## 2017-01-12 DIAGNOSIS — E7849 Other hyperlipidemia: Secondary | ICD-10-CM | POA: Diagnosis not present

## 2017-01-12 DIAGNOSIS — I10 Essential (primary) hypertension: Secondary | ICD-10-CM

## 2017-01-12 MED ORDER — HYDROCHLOROTHIAZIDE 25 MG PO TABS: 25.0000 mg | ORAL_TABLET | Freq: Every day | ORAL | 0 refills | Status: DC

## 2017-01-12 NOTE — Progress Notes (Signed)
Office: [phone]  /  Fax: [phone]   HPI:   Chief Complaint: OBESITY Grace Horton is here to discuss her progress with her obesity treatment plan. She is on the keep a food journal with 1200 calories and 95 grams of protein daily and is following her eating plan approximately 90 % of the time. She states she is exercising 0 minutes 0 times per week. [name] is retaining fluids. She has not taken her diuretic for the past 3 days, states she misplaced it.  She continues to me mindful of her eating and controls her portions.  motivated to get back on track and continue with weight loss.  Her weight is 200 lb (90.7 kg) today and has gained 3 pounds since her last visit. She has lost 19 lbs since starting treatment with [name].  Hypertension Grace Horton is a 46 y.o. female with hypertension. Grace Horton's blood pressure is stable and she denies chest pain or shortness of breath. She is working weight loss to help control her blood pressure with the goal of decreasing her risk of heart attack and stroke. Grace Horton's blood pressure is not currently controlled.  Hyperlipidemia Grace Horton has hyperlipidemia and has been trying to improve her cholesterol levels with intensive lifestyle modification including a low saturated fat diet, exercise and weight loss. She is not on a statin and she declines medications today. She denies any chest pain, claudication or myalgias.  ALLERGIES: No Known Allergies  MEDICATIONS: Current Outpatient Medications on File Prior to Visit  Medication Sig Dispense Refill  . Coenzyme Q10 10 MG capsule Take 100 mg by mouth daily.    . Diclofenac Sodium (PENNSAID) 2 % SOLN Fingertip amount to affected area twice daily 1 Bottle 2  . levonorgestrel-ethinyl estradiol (AVIANE,ALESSE,LESSINA) 0.1-20 MG-MCG tablet Take 1 tablet by mouth daily.    . metFORMIN (GLUCOPHAGE) 500 MG tablet Take 1 tablet (500 mg total) 2 (two) times daily with a meal by mouth. 60 tablet 0  . Skin Protectants,  Misc. (EUCERIN) cream Apply topically as needed for dry skin. 454 g 0  . Turmeric 450 MG CAPS Take by mouth at bedtime.    . Vitamin D, Ergocalciferol, (DRISDOL) 50000 units CAPS capsule Take 1 capsule (50,000 Units total) by mouth every 7 (seven) days. 4 capsule 0   No current facility-administered medications on file prior to visit.     PAST MEDICAL HISTORY: Past Medical History:  Diagnosis Date  . AC (acromioclavicular) joint bone spurs   . Anxiety   . Back pain   . Chondromalacia   . Depression   . GERD (gastroesophageal reflux disease)   . History of shingles 2011   Around Left Eye  . Joint pain   . Raynaud disease   . Swelling    in feet or legs  . Vitamin D deficiency     PAST SURGICAL HISTORY: Past Surgical History:  Procedure Laterality Date  . CESAREAN SECTION    . SHOULDER SURGERY Left 2007  . TONSILLECTOMY      SOCIAL HISTORY: Social History   Tobacco Use  . Smoking status: Never Smoker  . Smokeless tobacco: Never Used  Substance Use Topics  . Alcohol use: No  . Drug use: No    FAMILY HISTORY: Family History  Problem Relation Age of Onset  . Heart disease Father   . Diabetes Father   . Kidney disease Father        Stage III   . Transient ischemic attack Father  S/P Carotid endarterectomy  . Hypertension Father   . Hyperlipidemia Father   . Irritable bowel syndrome Mother   . Hypertension Mother   . Hyperlipidemia Mother   . Obesity Mother     ROS: Review of Systems  Constitutional: Negative for weight loss.  Respiratory: Negative for shortness of breath.   Cardiovascular: Negative for chest pain and claudication.  Musculoskeletal: Negative for myalgias.    PHYSICAL EXAM: Blood pressure 111/75, pulse 68, temperature 98.4 F (36.9 C), temperature source Oral, height 5\' 2"  (1.575 m), weight 200 lb (90.7 kg), SpO2 99 %. Body mass index is 36.58 kg/m. Physical Exam  Constitutional: She is oriented to person, place, and time. She  appears well-developed and well-nourished.  Cardiovascular: Normal rate.  Pulmonary/Chest: Effort normal.  Musculoskeletal: Normal range of motion.  Neurological: She is oriented to person, place, and time.  Skin: Skin is warm and dry.  Psychiatric: She has a normal mood and affect. Her behavior is normal.  Vitals reviewed.   RECENT LABS AND TESTS: BMET    Component Value Date/Time   NA 141 10/08/2016 1025   K 4.3 10/08/2016 1025   CL 101 10/08/2016 1025   CO2 25 10/08/2016 1025   GLUCOSE 85 10/08/2016 1025   GLUCOSE 83 05/27/2012 1119   BUN 13 10/08/2016 1025   CREATININE 0.74 10/08/2016 1025   CREATININE 0.81 05/27/2012 1119   CALCIUM 9.1 10/08/2016 1025   GFRNONAA 96 10/08/2016 1025   GFRAA 111 10/08/2016 1025   Lab Results  Component Value Date   HGBA1C 5.3 10/08/2016   HGBA1C 5.7 (H) 06/25/2016   Lab Results  Component Value Date   INSULIN 9.7 10/08/2016   INSULIN 12.7 06/25/2016   CBC    Component Value Date/Time   WBC 8.3 06/25/2016 1053   WBC 6.7 05/27/2012 1119   RBC 4.15 06/25/2016 1053   RBC 4.46 05/27/2012 1119   HGB 12.2 06/25/2016 1053   HCT 37.1 06/25/2016 1053   PLT 317 05/27/2012 1119   MCV 89 06/25/2016 1053   MCH 29.4 06/25/2016 1053   MCH 30.7 05/27/2012 1119   MCHC 32.9 06/25/2016 1053   MCHC 33.7 05/27/2012 1119   RDW 14.9 06/25/2016 1053   LYMPHSABS 2.4 06/25/2016 1053   MONOABS 0.4 05/27/2012 1119   EOSABS 0.2 06/25/2016 1053   BASOSABS 0.0 06/25/2016 1053   Iron/TIBC/Ferritin/ %Sat No results found for: IRON, TIBC, FERRITIN, IRONPCTSAT Lipid Panel     Component Value Date/Time   CHOL 178 10/08/2016 1025   TRIG 75 10/08/2016 1025   HDL 39 (L) 10/08/2016 1025   LDLCALC 124 (H) 10/08/2016 1025   Hepatic Function Panel     Component Value Date/Time   PROT 6.9 10/08/2016 1025   ALBUMIN 4.3 10/08/2016 1025   AST 18 10/08/2016 1025   ALT 16 10/08/2016 1025   ALKPHOS 66 10/08/2016 1025   BILITOT 0.3 10/08/2016 1025        Component Value Date/Time   TSH 1.500 10/08/2016 1025   TSH 1.630 06/25/2016 1053    ASSESSMENT AND PLAN: Essential hypertension - Plan: hydrochlorothiazide (HYDRODIURIL) 25 MG tablet  Other hyperlipidemia  Class 2 severe obesity with serious comorbidity and body mass index (BMI) of 36.0 to 36.9 in adult, unspecified obesity type (HCC)  PLAN:  Hypertension We discussed sodium restriction, working on healthy weight loss, and a regular exercise program as the means to achieve improved blood pressure control. Kennah agreed with this plan and agreed to follow up as  directed. We will continue to monitor her blood pressure as well as her progress with the above lifestyle modifications. Edye agrees to continue taking hydrochlorothiazide 25 mg qd #30 and we will refill for 1 month and she will watch for signs of hypotension as she continues her lifestyle modifications. Sheran agrees to follow up with our clinic in 2 weeks.  Hyperlipidemia Zoi was informed of the American Heart Association Guidelines emphasizing intensive lifestyle modifications as the first line treatment for hyperlipidemia. We discussed many lifestyle modifications today in depth, and Wynona will continue to work on decreasing saturated fats such as fatty red meat, butter and many fried foods. She will also increase vegetables and lean protein in her diet and continue to work on diet, exercise and weight loss efforts. Ajwa agrees to follow up with our clinic in 2 weeks.  Obesity Lynda is currently in the action stage of change. As such, her goal is to continue with weight loss efforts She has agreed to change to follow a lower carbohydrate, vegetable and lean protein rich diet plan Avonda has been instructed to work up to a goal of 150 minutes of combined cardio and strengthening exercise per week for weight loss and overall health benefits. We discussed the following Behavioral Modification Strategies today: increasing  lean protein intake and planning for success   Vanessa has agreed to follow up with our clinic in 2 weeks. She was informed of the importance of frequent follow up visits to maximize her success with intensive lifestyle modifications for her multiple health conditions.  I, Trixie Dredge, am acting as transcriptionist for Lacy Duverney, PA-C  I have reviewed the above documentation for accuracy and completeness, and I agree with the above. -Lacy Duverney, PA-C  I have reviewed the above note and agree with the plan. -Dennard Nip, MD     Today's visit was # 13 out of 22.  Starting weight: 219 lbs Starting date: 06/25/16 Today's weight : 200 lbs  Today's date: 01/12/2017 Total lbs lost to date: 13 (Patients must lose 7 lbs in the first 6 months to continue with counseling)   ASK: We discussed the diagnosis of obesity with Alben Spittle today and [place] agreed to give [name] permission to discuss obesity behavioral modification therapy today.  ASSESS: Grace Horton has the diagnosis of obesity and her BMI today is 36.57 Grace Horton is in the action stage of change   ADVISE: Svea was educated on the multiple health risks of obesity as well as the benefit of weight loss to improve her health. She was advised of the need for long term treatment and the importance of lifestyle modifications.  AGREE: Multiple dietary modification options and treatment options were discussed and  Evone agreed to follow a lower carbohydrate, vegetable and lean protein rich diet plan We discussed the following Behavioral Modification Strategies today: increasing lean protein intake and planning for success

## 2017-01-13 ENCOUNTER — Encounter (INDEPENDENT_AMBULATORY_CARE_PROVIDER_SITE_OTHER): Payer: Self-pay | Admitting: Physician Assistant

## 2017-01-27 ENCOUNTER — Ambulatory Visit (INDEPENDENT_AMBULATORY_CARE_PROVIDER_SITE_OTHER): Payer: BC Managed Care – PPO | Admitting: Family Medicine

## 2017-01-27 VITALS — BP 120/71 | HR 74 | Temp 98.4°F | Ht 62.0 in | Wt 198.0 lb

## 2017-01-27 DIAGNOSIS — Z6836 Body mass index (BMI) 36.0-36.9, adult: Secondary | ICD-10-CM | POA: Diagnosis not present

## 2017-01-27 DIAGNOSIS — I9589 Other hypotension: Secondary | ICD-10-CM

## 2017-01-27 DIAGNOSIS — R7303 Prediabetes: Secondary | ICD-10-CM | POA: Diagnosis not present

## 2017-01-28 ENCOUNTER — Ambulatory Visit (INDEPENDENT_AMBULATORY_CARE_PROVIDER_SITE_OTHER): Payer: BC Managed Care – PPO | Admitting: Physician Assistant

## 2017-01-28 NOTE — Progress Notes (Signed)
Office: [phone]  /  Fax: [phone]   HPI:   Chief Complaint: OBESITY Grace Horton is here to discuss her progress with her obesity treatment plan. She is on the lower carbohydrate, vegetable and lean protein rich diet plan and is following her eating plan approximately 70 % of the time. She states she is exercising 0 minutes 0 times per week. Grace Horton has done well with weight loss in the last 2 weeks. She has had some increased celebration eating but is still doing well with decreasing simple carbohydrates. She is doing well with making strategies for how to deal with stress eating.  Her weight is 198 lb (89.8 kg) today and has had a weight loss of 2 pounds over a period of 2 weeks since her last visit. She has lost 21 lbs since starting treatment with Grace Horton.  Pre-Diabetes Grace Horton has a diagnosis of pre-diabetes based on her elevated Hgb A1c and was informed this puts her at greater risk of developing diabetes. She has improved on metformin and continues to work on diet and exercise to decrease risk of diabetes. She denies nausea, vomiting, or hypoglycemia.  Post Prandial Hypotension Grace Horton had 1 episode of feeling very faint at a holiday party after a larger meal. She laid flat and symptoms improved. She has not had symptoms like this previously.  ALLERGIES: No Known Allergies  MEDICATIONS: Current Outpatient Medications on File Prior to Visit  Medication Sig Dispense Refill  . Coenzyme Q10 10 MG capsule Take 100 mg by mouth daily.    . Diclofenac Sodium (PENNSAID) 2 % SOLN Fingertip amount to affected area twice daily 1 Bottle 2  . hydrochlorothiazide (HYDRODIURIL) 25 MG tablet Take 1 tablet (25 mg total) by mouth daily. 30 tablet 0  . levonorgestrel-ethinyl estradiol (AVIANE,ALESSE,LESSINA) 0.1-20 MG-MCG tablet Take 1 tablet by mouth daily.    . metFORMIN (GLUCOPHAGE) 500 MG tablet Take 1 tablet (500 mg total) 2 (two) times daily with a meal by mouth. 60 tablet 0  . Skin Protectants,  Misc. (EUCERIN) cream Apply topically as needed for dry skin. 454 g 0  . Turmeric 450 MG CAPS Take by mouth at bedtime.    . Vitamin D, Ergocalciferol, (DRISDOL) 50000 units CAPS capsule Take 1 capsule (50,000 Units total) by mouth every 7 (seven) days. 4 capsule 0   No current facility-administered medications on file prior to visit.     PAST MEDICAL HISTORY: Past Medical History:  Diagnosis Date  . AC (acromioclavicular) joint bone spurs   . Anxiety   . Back pain   . Chondromalacia   . Depression   . GERD (gastroesophageal reflux disease)   . History of shingles 2011   Around Left Eye  . Joint pain   . Raynaud disease   . Swelling    in feet or legs  . Vitamin D deficiency     PAST SURGICAL HISTORY: Past Surgical History:  Procedure Laterality Date  . CESAREAN SECTION    . SHOULDER SURGERY Left 2007  . TONSILLECTOMY      SOCIAL HISTORY: Social History   Tobacco Use  . Smoking status: Never Smoker  . Smokeless tobacco: Never Used  Substance Use Topics  . Alcohol use: No  . Drug use: No    FAMILY HISTORY: Family History  Problem Relation Age of Onset  . Heart disease Father   . Diabetes Father   . Kidney disease Father        Stage III   . Transient ischemic  attack Father        S/P Carotid endarterectomy  . Hypertension Father   . Hyperlipidemia Father   . Irritable bowel syndrome Mother   . Hypertension Mother   . Hyperlipidemia Mother   . Obesity Mother     ROS: Review of Systems  Constitutional: Positive for weight loss.  Gastrointestinal: Negative for nausea and vomiting.  Endo/Heme/Allergies:       Negative hypoglycemia    PHYSICAL EXAM: Blood pressure 120/71, pulse 74, temperature 98.4 F (36.9 C), temperature source Oral, height 5\' 2"  (1.575 m), weight 198 lb (89.8 kg), SpO2 98 %. Body mass index is 36.21 kg/m. Physical Exam  Constitutional: She is oriented to person, place, and time. She appears well-developed and well-nourished.    Cardiovascular: Normal rate.  Pulmonary/Chest: Effort normal.  Musculoskeletal: Normal range of motion.  Neurological: She is oriented to person, place, and time.  Skin: Skin is warm and dry.  Psychiatric: She has a normal mood and affect. Her behavior is normal.  Vitals reviewed.   RECENT LABS AND TESTS: BMET    Component Value Date/Time   NA 141 10/08/2016 1025   K 4.3 10/08/2016 1025   CL 101 10/08/2016 1025   CO2 25 10/08/2016 1025   GLUCOSE 85 10/08/2016 1025   GLUCOSE 83 05/27/2012 1119   BUN 13 10/08/2016 1025   CREATININE 0.74 10/08/2016 1025   CREATININE 0.81 05/27/2012 1119   CALCIUM 9.1 10/08/2016 1025   GFRNONAA 96 10/08/2016 1025   GFRAA 111 10/08/2016 1025   Lab Results  Component Value Date   HGBA1C 5.3 10/08/2016   HGBA1C 5.7 (H) 06/25/2016   Lab Results  Component Value Date   INSULIN 9.7 10/08/2016   INSULIN 12.7 06/25/2016   CBC    Component Value Date/Time   WBC 8.3 06/25/2016 1053   WBC 6.7 05/27/2012 1119   RBC 4.15 06/25/2016 1053   RBC 4.46 05/27/2012 1119   HGB 12.2 06/25/2016 1053   HCT 37.1 06/25/2016 1053   PLT 317 05/27/2012 1119   MCV 89 06/25/2016 1053   MCH 29.4 06/25/2016 1053   MCH 30.7 05/27/2012 1119   MCHC 32.9 06/25/2016 1053   MCHC 33.7 05/27/2012 1119   RDW 14.9 06/25/2016 1053   LYMPHSABS 2.4 06/25/2016 1053   MONOABS 0.4 05/27/2012 1119   EOSABS 0.2 06/25/2016 1053   BASOSABS 0.0 06/25/2016 1053   Iron/TIBC/Ferritin/ %Sat No results found for: IRON, TIBC, FERRITIN, IRONPCTSAT Lipid Panel     Component Value Date/Time   CHOL 178 10/08/2016 1025   TRIG 75 10/08/2016 1025   HDL 39 (L) 10/08/2016 1025   LDLCALC 124 (H) 10/08/2016 1025   Hepatic Function Panel     Component Value Date/Time   PROT 6.9 10/08/2016 1025   ALBUMIN 4.3 10/08/2016 1025   AST 18 10/08/2016 1025   ALT 16 10/08/2016 1025   ALKPHOS 66 10/08/2016 1025   BILITOT 0.3 10/08/2016 1025      Component Value Date/Time   TSH 1.500  10/08/2016 1025   TSH 1.630 06/25/2016 1053    ASSESSMENT AND PLAN: Prediabetes  Other specified hypotension - Postprandial  Class 2 severe obesity with serious comorbidity and body mass index (BMI) of 36.0 to 36.9 in adult, unspecified obesity type ([place])  PLAN:  Pre-Diabetes Ednamae will continue to work on weight loss, diet, exercise, and decreasing simple carbohydrates in her diet to help decrease the risk of diabetes. We dicussed metformin including benefits and risks. She was informed  that eating too many simple carbohydrates or too many calories at one sitting increases the likelihood of GI side effects. Lauriann agrees to continue taking metformin as prescribed. We will recheck labs in 1 month and Earlisha agrees to follow up with our clinic in 3 weeks as directed to monitor her progress.  We spent > than 50% of the 30 minute visit on the counseling as documented in the note.  Post Prandial Hypotension I have explained what happened and encouraged Johnye to avoid large meals. We will continue to monitor closely. Daksha agrees to follow up with our clinic in 3 weeks.  Obesity Jackqueline is currently in the action stage of change. As such, her goal is to continue with weight loss efforts She has agreed to follow a lower carbohydrate, vegetable and lean protein rich diet plan Aretta has been instructed to work up to a goal of 150 minutes of combined cardio and strengthening exercise per week for weight loss and overall health benefits. We discussed the following Behavioral Modification Strategies today: increasing lean protein intake and holiday eating strategies    Tesneem has agreed to follow up with our clinic in 3 weeks. She was informed of the importance of frequent follow up visits to maximize her success with intensive lifestyle modifications for her multiple health conditions.  I, Trixie Dredge, am acting as transcriptionist for Dennard Nip, MD  I have reviewed the above  documentation for accuracy and completeness, and I agree with the above. -Dennard Nip, MD     Today's visit was # 14 out of 22.  Starting weight: 219 lbs Starting date: 06/25/16 Today's weight : 198 lbs  Today's date: 01/27/2017 Total lbs lost to date: 21 (Patients must lose 7 lbs in the first 6 months to continue with counseling)   ASK: We discussed the diagnosis of obesity with Alben Spittle today and Mariska agreed to give Grace Horton permission to discuss obesity behavioral modification therapy today.  ASSESS: Penne has the diagnosis of obesity and her BMI today is 36.21 Imberly is in the action stage of change   ADVISE: Semira was educated on the multiple health risks of obesity as well as the benefit of weight loss to improve her health. She was advised of the need for long term treatment and the importance of lifestyle modifications.  AGREE: Multiple dietary modification options and treatment options were discussed and  Merrit agreed to follow a lower carbohydrate, vegetable and lean protein rich diet plan We discussed the following Behavioral Modification Strategies today: increasing lean protein intake and holiday eating strategies

## 2017-02-17 ENCOUNTER — Ambulatory Visit (INDEPENDENT_AMBULATORY_CARE_PROVIDER_SITE_OTHER): Payer: BC Managed Care – PPO | Admitting: Family Medicine

## 2017-02-17 VITALS — BP 112/75 | HR 72 | Temp 97.5°F | Ht 62.0 in | Wt 201.0 lb

## 2017-02-17 DIAGNOSIS — I10 Essential (primary) hypertension: Secondary | ICD-10-CM | POA: Diagnosis not present

## 2017-02-17 DIAGNOSIS — F3289 Other specified depressive episodes: Secondary | ICD-10-CM

## 2017-02-17 DIAGNOSIS — Z6836 Body mass index (BMI) 36.0-36.9, adult: Secondary | ICD-10-CM

## 2017-02-17 DIAGNOSIS — E559 Vitamin D deficiency, unspecified: Secondary | ICD-10-CM | POA: Diagnosis not present

## 2017-02-17 DIAGNOSIS — R7303 Prediabetes: Secondary | ICD-10-CM | POA: Diagnosis not present

## 2017-02-17 MED ORDER — METFORMIN HCL 500 MG PO TABS: 500.0000 mg | ORAL_TABLET | Freq: Two times a day (BID) | ORAL | 0 refills | Status: DC

## 2017-02-17 MED ORDER — BUPROPION HCL ER (SR) 150 MG PO TB12: 150.0000 mg | ORAL_TABLET | Freq: Every day | ORAL | 0 refills | Status: DC

## 2017-02-18 LAB — VITAMIN D 25 HYDROXY (VIT D DEFICIENCY, FRACTURES): Vit D, 25-Hydroxy: 41.9 ng/mL (ref 30.0–100.0)

## 2017-02-18 LAB — INSULIN, RANDOM: INSULIN: 12.3 u[IU]/mL (ref 2.6–24.9)

## 2017-02-18 LAB — COMPREHENSIVE METABOLIC PANEL
A/G RATIO: 2 (ref 1.2–2.2)
ALK PHOS: 61 IU/L (ref 39–117)
ALT: 12 IU/L (ref 0–32)
AST: 12 IU/L (ref 0–40)
Albumin: 4.5 g/dL (ref 3.5–5.5)
BILIRUBIN TOTAL: 0.3 mg/dL (ref 0.0–1.2)
BUN/Creatinine Ratio: 24 — ABNORMAL HIGH (ref 9–23)
BUN: 18 mg/dL (ref 6–24)
CALCIUM: 9.3 mg/dL (ref 8.7–10.2)
CHLORIDE: 101 mmol/L (ref 96–106)
CO2: 21 mmol/L (ref 20–29)
Creatinine, Ser: 0.74 mg/dL (ref 0.57–1.00)
GFR calc Af Amer: 111 mL/min/{1.73_m2} (ref >59)
GFR, EST NON AFRICAN AMERICAN: 96 mL/min/{1.73_m2} (ref >59)
GLOBULIN, TOTAL: 2.3 g/dL (ref 1.5–4.5)
Glucose: 88 mg/dL (ref 65–99)
POTASSIUM: 4.5 mmol/L (ref 3.5–5.2)
SODIUM: 141 mmol/L (ref 134–144)
Total Protein: 6.8 g/dL (ref 6.0–8.5)

## 2017-02-18 LAB — HEMOGLOBIN A1C
ESTIMATED AVERAGE GLUCOSE: 111 mg/dL
Hgb A1c MFr Bld: 5.5 % (ref 4.8–5.6)

## 2017-02-18 NOTE — Progress Notes (Signed)
Office: [phone]  /  Fax: [phone]   HPI:   Chief Complaint: OBESITY Grace Horton is here to discuss her progress with her obesity treatment plan. She is on the lower carbohydrate, vegetable and lean protein rich diet plan and is following her eating plan approximately 60 % of the time. She states she is exercising 0 minutes 0 times per week. Grace Horton is not doing well with weight loss over the holidays. She is struggling overall with following any of our plans. She states she is now ready to get back on track and would like to continue journaling.  Her weight is 201 lb (91.2 kg) today and has gained 3 pounds since her last visit. She has lost 18 lbs since starting treatment with [name].  Hypertension Grace Horton is a 47 y.o. female with hypertension. Grace Horton's blood pressure is stable on medications and continuing to improve. She is on hydrochlorothiazide for edema as well. She would like to try a lower dose and see how she does. Grace Horton denies chest pain or shortness of breath. She is working weight loss to help control her blood pressure with the goal of decreasing her risk of heart attack and stroke. Grace Horton's blood pressure is currently controlled.  Pre-Diabetes Grace Horton has a diagnosis of pre-diabetes based on her elevated Hgb A1c and was informed this puts her at greater risk of developing diabetes. She is stable on metformin but she is not following her diet well and she is still increasing simple carbohydrates. She denies nausea or hypoglycemia.  Vitamin D deficiency Grace Horton has a diagnosis of vitamin D deficiency. She is on prescription Vit D 50,000 QOW and she denies nausea, vomiting or muscle weakness.  Depression with emotional eating behaviors Grace Horton notes increased emotional eating and fatigue and she is using food for comfort to the extent that it is negatively impacting her health. She often snacks when she is not hungry. Grace Horton sometimes feels she is out of control and then feels  guilty that she made poor food choices. She has been working on behavior modification techniques to help reduce her emotional eating and has been somewhat successful. She shows no sign of suicidal or homicidal ideations.  Depression screen PHQ 2/9 06/25/2016  Decreased Interest 3  Down, Depressed, Hopeless 1  PHQ - 2 Score 4  Altered sleeping 0  Tired, decreased energy 3  Change in appetite 2  Feeling bad or failure about yourself  1  Trouble concentrating 1  Moving slowly or fidgety/restless 3  Suicidal thoughts 0  PHQ-9 Score 14   ALLERGIES: No Known Allergies  MEDICATIONS: Current Outpatient Medications on File Prior to Visit  Medication Sig Dispense Refill  . Diclofenac Sodium (PENNSAID) 2 % SOLN Fingertip amount to affected area twice daily 1 Bottle 2  . hydrochlorothiazide (HYDRODIURIL) 25 MG tablet Take 1 tablet (25 mg total) by mouth daily. (Patient taking differently: Take 25 mg by mouth daily. Take one half tab daily) 30 tablet 0  . levonorgestrel-ethinyl estradiol (AVIANE,ALESSE,LESSINA) 0.1-20 MG-MCG tablet Take 1 tablet by mouth daily.    . Turmeric 450 MG CAPS Take by mouth at bedtime.    . Vitamin D, Ergocalciferol, (DRISDOL) 50000 units CAPS capsule Take 1 capsule (50,000 Units total) by mouth every 7 (seven) days. 4 capsule 0   No current facility-administered medications on file prior to visit.     PAST MEDICAL HISTORY: Past Medical History:  Diagnosis Date  . AC (acromioclavicular) joint bone spurs   . Anxiety   .  Back pain   . Chondromalacia   . Depression   . GERD (gastroesophageal reflux disease)   . History of shingles 2011   Around Left Eye  . Joint pain   . Raynaud disease   . Swelling    in feet or legs  . Vitamin D deficiency     PAST SURGICAL HISTORY: Past Surgical History:  Procedure Laterality Date  . CESAREAN SECTION    . SHOULDER SURGERY Left 2007  . TONSILLECTOMY      SOCIAL HISTORY: Social History   Tobacco Use  . Smoking  status: Never Smoker  . Smokeless tobacco: Never Used  Substance Use Topics  . Alcohol use: No  . Drug use: No    FAMILY HISTORY: Family History  Problem Relation Age of Onset  . Heart disease Father   . Diabetes Father   . Kidney disease Father        Stage III   . Transient ischemic attack Father        S/P Carotid endarterectomy  . Hypertension Father   . Hyperlipidemia Father   . Irritable bowel syndrome Mother   . Hypertension Mother   . Hyperlipidemia Mother   . Obesity Mother     ROS: Review of Systems  Constitutional: Positive for malaise/fatigue. Negative for weight loss.  Respiratory: Negative for shortness of breath.   Cardiovascular: Negative for chest pain.  Gastrointestinal: Negative for nausea and vomiting.  Musculoskeletal:       Negative muscle weakness  Endo/Heme/Allergies:       Negative hypoglycemia  Psychiatric/Behavioral: Positive for depression. Negative for suicidal ideas.    PHYSICAL EXAM: Blood pressure 112/75, pulse 72, temperature (!) 97.5 F (36.4 C), temperature source Oral, height 5\' 2"  (1.575 m), weight 201 lb (91.2 kg), SpO2 99 %. Body mass index is 36.76 kg/m. Physical Exam  Constitutional: She is oriented to person, place, and time. She appears well-developed and well-nourished.  Cardiovascular: Normal rate.  Pulmonary/Chest: Effort normal.  Musculoskeletal: Normal range of motion.  Neurological: She is oriented to person, place, and time.  Skin: Skin is warm and dry.  Psychiatric: She has a normal mood and affect.  Vitals reviewed.   RECENT LABS AND TESTS: BMET    Component Value Date/Time   NA 141 02/17/2017 0948   K 4.5 02/17/2017 0948   CL 101 02/17/2017 0948   CO2 21 02/17/2017 0948   GLUCOSE 88 02/17/2017 0948   GLUCOSE 83 05/27/2012 1119   BUN 18 02/17/2017 0948   CREATININE 0.74 02/17/2017 0948   CREATININE 0.81 05/27/2012 1119   CALCIUM 9.3 02/17/2017 0948   GFRNONAA 96 02/17/2017 0948   GFRAA 111  02/17/2017 0948   Lab Results  Component Value Date   HGBA1C 5.5 02/17/2017   HGBA1C 5.3 10/08/2016   HGBA1C 5.7 (H) 06/25/2016   Lab Results  Component Value Date   INSULIN 12.3 02/17/2017   INSULIN 9.7 10/08/2016   INSULIN 12.7 06/25/2016   CBC    Component Value Date/Time   WBC 8.3 06/25/2016 1053   WBC 6.7 05/27/2012 1119   RBC 4.15 06/25/2016 1053   RBC 4.46 05/27/2012 1119   HGB 12.2 06/25/2016 1053   HCT 37.1 06/25/2016 1053   PLT 317 05/27/2012 1119   MCV 89 06/25/2016 1053   MCH 29.4 06/25/2016 1053   MCH 30.7 05/27/2012 1119   MCHC 32.9 06/25/2016 1053   MCHC 33.7 05/27/2012 1119   RDW 14.9 06/25/2016 1053   LYMPHSABS 2.4 06/25/2016  1053   MONOABS 0.4 05/27/2012 1119   EOSABS 0.2 06/25/2016 1053   BASOSABS 0.0 06/25/2016 1053   Iron/TIBC/Ferritin/ %Sat No results found for: IRON, TIBC, FERRITIN, IRONPCTSAT Lipid Panel     Component Value Date/Time   CHOL 178 10/08/2016 1025   TRIG 75 10/08/2016 1025   HDL 39 (L) 10/08/2016 1025   LDLCALC 124 (H) 10/08/2016 1025   Hepatic Function Panel     Component Value Date/Time   PROT 6.8 02/17/2017 0948   ALBUMIN 4.5 02/17/2017 0948   AST 12 02/17/2017 0948   ALT 12 02/17/2017 0948   ALKPHOS 61 02/17/2017 0948   BILITOT 0.3 02/17/2017 0948      Component Value Date/Time   TSH 1.500 10/08/2016 1025   TSH 1.630 06/25/2016 1053    ASSESSMENT AND PLAN: Essential hypertension  Prediabetes - Plan: Comprehensive metabolic panel, Hemoglobin A1c, Insulin, random, metFORMIN (GLUCOPHAGE) 500 MG tablet  Vitamin D deficiency - Plan: VITAMIN D 25 Hydroxy (Vit-D Deficiency, Fractures)  Other depression - with emotional eating - Plan: buPROPion (WELLBUTRIN SR) 150 MG 12 hr tablet  Class 2 severe obesity with serious comorbidity and body mass index (BMI) of 36.0 to 36.9 in adult, unspecified obesity type ([place])  PLAN:  Hypertension We discussed sodium restriction, working on healthy weight loss, and a regular  exercise program as the means to achieve improved blood pressure control. Grace Horton agreed with this plan and agreed to follow up as directed. We will continue to monitor her blood pressure as well as her progress with the above lifestyle modifications. Grace Horton agrees to decrease hydrochlorothiazide to 12.5 mg q AM (no refill needed, she will cut current medication in half) and will watch for signs of hypotension as she continues her lifestyle modifications. Trinisha agrees to follow up with our clinic in 2 weeks.  Pre-Diabetes Grace Horton will continue to work on diet, weight loss, exercise, and decreasing simple carbohydrates in her diet to help decrease the risk of diabetes. We dicussed metformin including benefits and risks. She was informed that eating too many simple carbohydrates or too many calories at one sitting increases the likelihood of GI side effects. Grace Horton agrees to continue taking metformin 500 mg BID #60 and we will refill for 1 month. Grace Horton agrees to follow up with our clinic in 2 weeks as directed to monitor her progress.  Vitamin D Deficiency Grace Horton was informed that low vitamin D levels contributes to fatigue and are associated with obesity, breast, and colon cancer. Grace Horton agrees to continue taking prescription Vit D @50 ,000 IU every other week #2 and will follow up for routine testing of vitamin D, at least 2-3 times per year. She was informed of the risk of over-replacement of vitamin D and agrees to not increase her dose unless he discusses this with [name] first. We will check labs and Grace Horton agrees to follow up with our clinic in 2 weeks.  Depression with Emotional Eating Behaviors We discussed behavior modification techniques today to help Grace Horton deal with her emotional eating and depression. Grace Horton agrees to restart Wellbutrin SR 150 mg qd #30 with no refills. Grace Horton agrees to follow up with our clinic in 2 weeks.  Obesity Grace Horton is currently in the action stage of change. As such, her  goal is to continue with weight loss efforts She has agreed to keep a food journal with 1100-1300 calories and 90 grams of protein daily Grace Horton has been instructed to work up to a goal of 150 minutes of combined cardio  and strengthening exercise per week for weight loss and overall health benefits. We discussed the following Behavioral Modification Strategies today: work on meal planning and easy cooking plans, keeping healthy foods in the home, and planning for success   Grace Horton has agreed to follow up with our clinic in 2 weeks. She was informed of the importance of frequent follow up visits to maximize her success with intensive lifestyle modifications for her multiple health conditions.   OBESITY BEHAVIORAL INTERVENTION VISIT  Today's visit was # 15 out of 22.  Starting weight: 219 lbs Starting date: 06/25/16 Today's weight : 201 lbs  Today's date: 02/17/2017 Total lbs lost to date: 80 (Patients must lose 7 lbs in the first 6 months to continue with counseling)   ASK: We discussed the diagnosis of obesity with Alben Spittle today and Chandni agreed to give [name] permission to discuss obesity behavioral modification therapy today.  ASSESS: Raphaella has the diagnosis of obesity and her BMI today is 36.75 Ambrielle is in the action stage of change   ADVISE: Angeles was educated on the multiple health risks of obesity as well as the benefit of weight loss to improve her health. She was advised of the need for long term treatment and the importance of lifestyle modifications.  AGREE: Multiple dietary modification options and treatment options were discussed and  Zyaire agreed to the above obesity treatment plan.  I, Grace Horton, am acting as transcriptionist for Dennard Nip, MD  I have reviewed the above documentation for accuracy and completeness, and I agree with the above. -Dennard Nip, MD

## 2017-03-02 ENCOUNTER — Ambulatory Visit (INDEPENDENT_AMBULATORY_CARE_PROVIDER_SITE_OTHER): Payer: BC Managed Care – PPO | Admitting: Dietician

## 2017-03-02 VITALS — Ht 62.0 in | Wt 198.0 lb

## 2017-03-02 DIAGNOSIS — Z6836 Body mass index (BMI) 36.0-36.9, adult: Secondary | ICD-10-CM | POA: Diagnosis not present

## 2017-03-02 DIAGNOSIS — R7303 Prediabetes: Secondary | ICD-10-CM | POA: Diagnosis not present

## 2017-03-02 DIAGNOSIS — Z9189 Other specified personal risk factors, not elsewhere classified: Secondary | ICD-10-CM | POA: Diagnosis not present

## 2017-03-03 NOTE — Progress Notes (Signed)
  Office: [phone]  /  Fax: [phone]     Grace Horton has a diagnosis of prediabetes based on her elevated HgA1c and was informed this puts her at greater risk of developing diabetes. Grace Horton is here today for diabetes risk nutrition counseling which includes her obesity treatment plan.  Grace Horton's weight today is 198 lbs, a 3 lb weight loss since her last visit. She has had a weight loss of 21 lbs since beginning treatment with [name]. She states she has been journaling her food intake 90% of the time and is back on track after the holidays. She reports since beginning the Wellbutrin her food cravings are better controlled and she is sleeping better and her energy is better during the day.   Patient was educated about food nutrients ie protein, fats, simple and complex carbohydrates and how these affect insulin response. Focus on portion control,  avoiding simple carbohydrates and lower fat foods for ongoing wt loss efforts and glucose management.  Grace Horton is on the following meal plan: journaling food intake 1100-1300 calories and 70-80 g protein.  Her meal plan was individualized for maximum benefit.  Also discussed at length the following behavioral modifications to help maximize success:  increasing lean protein intake, decreasing simple carbohydrates, meal planning and cooking strategies.   Grace Horton has been instructed to work up to a goal of 150 minutes of combined cardio and strengthening exercise per week for weight loss and overall health benefits. Written information was provided and the following handouts were given: healthy recipes.    Office: [phone]  /  Fax: [phone]  OBESITY BEHAVIORAL INTERVENTION VISIT  Today's visit was # 16 out of 22.  Starting weight: 219 Starting date: 06/25/16 Today's weight : Weight: 198 lb (89.8 kg)  Today's date: 03/03/2017 Total lbs lost to date: 21 (Patients must lose 7 lbs in the first 6 months to continue with counseling)   ASK: We  discussed the diagnosis of obesity with Grace Horton today and Grace Horton agreed to give [name] permission to discuss obesity behavioral modification therapy today.  ASSESS: Grace Horton has the diagnosis of obesity and her BMI today is 36.21 Grace Horton is in the action stage of change   ADVISE: Grace Horton was educated on the multiple health risks of obesity as well as the benefit of weight loss to improve her health. She was advised of the need for long term treatment and the importance of lifestyle modifications.  AGREE: Multiple dietary modification options and treatment options were discussed and  Grace Horton agreed to keep a food journal with 1100-1300 calories and 70-80 g  protein  We discussed the following Behavioral Modification Stratagies today: work on meal planning and easy cooking plans   I have reviewed the above documentation for accuracy and completeness, and I agree with the above. -Dennard Nip, MD

## 2017-03-17 ENCOUNTER — Ambulatory Visit (INDEPENDENT_AMBULATORY_CARE_PROVIDER_SITE_OTHER): Payer: BC Managed Care – PPO | Admitting: Family Medicine

## 2017-03-17 VITALS — BP 109/76 | HR 70 | Temp 98.1°F | Ht 62.0 in | Wt 200.0 lb

## 2017-03-17 DIAGNOSIS — F3289 Other specified depressive episodes: Secondary | ICD-10-CM | POA: Diagnosis not present

## 2017-03-17 DIAGNOSIS — Z9189 Other specified personal risk factors, not elsewhere classified: Secondary | ICD-10-CM | POA: Diagnosis not present

## 2017-03-17 DIAGNOSIS — F32A Depression, unspecified: Secondary | ICD-10-CM | POA: Insufficient documentation

## 2017-03-17 DIAGNOSIS — R600 Localized edema: Secondary | ICD-10-CM

## 2017-03-17 DIAGNOSIS — Z6836 Body mass index (BMI) 36.0-36.9, adult: Secondary | ICD-10-CM | POA: Diagnosis not present

## 2017-03-17 DIAGNOSIS — F329 Major depressive disorder, single episode, unspecified: Secondary | ICD-10-CM | POA: Insufficient documentation

## 2017-03-17 MED ORDER — BUPROPION HCL ER (SR) 150 MG PO TB12: 150.0000 mg | ORAL_TABLET | Freq: Every day | ORAL | 0 refills | Status: DC

## 2017-03-17 NOTE — Progress Notes (Signed)
Office: [phone]  /  Fax: [phone]   HPI:   Chief Complaint: OBESITY Grace Horton is here to discuss her progress with her obesity treatment plan. She is on the keep a food journal with 1100-1300 calories and 90 grams of protein daily and is following her eating plan approximately 100 % of the time. She states she is walking and bike riding for 45 minutes 4 times per week. [name] is exercising most days and states her hunger is controlled. She is journaling well but doing more guesstimation of calories. She decreased her diuretic and is up a bit in H20 weight.  Her weight is 200 lb (90.7 kg) today and has gained 2 pounds since her last visit. She has lost 19 lbs since starting treatment with [name].  Bilateral Lower Extremity Edema Korena decreased hydrochlorothiazide to 1/2 and blood pressure is still low but no longer symptomatic with lightheadedness.  At risk for diabetes [name] is at higher than average risk for developing diabetes due to her obesity. She currently denies polyuria or polydipsia.  Depression with emotional eating behaviors [name] started Wellbutrin 3 to 4 weeks ago and feels her mood is good and her energy has improved. She denies insomnia and blood pressure is not elevated. She has decreased emotional eating. [name] struggles with emotional eating and using food for comfort to the extent that it is negatively impacting her health. She often snacks when she is not hungry. [name] sometimes feels she is out of control and then feels guilty that she made poor food choices. She has been working on behavior modification techniques to help reduce her emotional eating and has been somewhat successful. She shows no sign of suicidal or homicidal ideations.  Depression screen PHQ 2/9 06/25/2016  Decreased Interest 3  Down, Depressed, Hopeless 1  PHQ - 2 Score 4  Altered sleeping 0  Tired, decreased energy 3  Change in appetite 2  Feeling bad or failure about yourself  1  Trouble  concentrating 1  Moving slowly or fidgety/restless 3  Suicidal thoughts 0  PHQ-9 Score 14   ALLERGIES: No Known Allergies  MEDICATIONS: Current Outpatient Medications on File Prior to Visit  Medication Sig Dispense Refill  . Diclofenac Sodium (PENNSAID) 2 % SOLN Fingertip amount to affected area twice daily 1 Bottle 2  . levonorgestrel-ethinyl estradiol (AVIANE,ALESSE,LESSINA) 0.1-20 MG-MCG tablet Take 1 tablet by mouth daily.    . metFORMIN (GLUCOPHAGE) 500 MG tablet Take 1 tablet (500 mg total) by mouth 2 (two) times daily with a meal. 60 tablet 0  . Turmeric 450 MG CAPS Take by mouth at bedtime.    . Vitamin D, Ergocalciferol, (DRISDOL) 50000 units CAPS capsule Take 1 capsule (50,000 Units total) by mouth every 7 (seven) days. 4 capsule 0   No current facility-administered medications on file prior to visit.     PAST MEDICAL HISTORY: Past Medical History:  Diagnosis Date  . AC (acromioclavicular) joint bone spurs   . Anxiety   . Back pain   . Chondromalacia   . Depression   . GERD (gastroesophageal reflux disease)   . History of shingles 2011   Around Left Eye  . Joint pain   . Raynaud disease   . Swelling    in feet or legs  . Vitamin D deficiency     PAST SURGICAL HISTORY: Past Surgical History:  Procedure Laterality Date  . CESAREAN SECTION    . SHOULDER SURGERY Left 2007  . TONSILLECTOMY  SOCIAL HISTORY: Social History   Tobacco Use  . Smoking status: Never Smoker  . Smokeless tobacco: Never Used  Substance Use Topics  . Alcohol use: No  . Drug use: No    FAMILY HISTORY: Family History  Problem Relation Age of Onset  . Heart disease Father   . Diabetes Father   . Kidney disease Father        Stage III   . Transient ischemic attack Father        S/P Carotid endarterectomy  . Hypertension Father   . Hyperlipidemia Father   . Irritable bowel syndrome Mother   . Hypertension Mother   . Hyperlipidemia Mother   . Obesity Mother      ROS: Review of Systems  Constitutional: Negative for weight loss.  Genitourinary: Negative for frequency.  Neurological:       Negative lightheadedness  Endo/Heme/Allergies: Negative for polydipsia.  Psychiatric/Behavioral: Positive for depression. Negative for suicidal ideas. The patient does not have insomnia.     PHYSICAL EXAM: Blood pressure 109/76, pulse 70, temperature 98.1 F (36.7 C), temperature source Oral, height 5\' 2"  (1.575 m), weight 200 lb (90.7 kg), SpO2 100 %. Body mass index is 36.58 kg/m. Physical Exam  Constitutional: She is oriented to person, place, and time. She appears well-developed and well-nourished.  Cardiovascular: Normal rate.  Pulmonary/Chest: Effort normal.  Musculoskeletal: Normal range of motion.     Neurological: She is oriented to person, place, and time.  Skin: Skin is warm and dry.  Psychiatric: She has a normal mood and affect. Her behavior is normal.  Vitals reviewed.   RECENT LABS AND TESTS: BMET    Component Value Date/Time   NA 141 02/17/2017 0948   K 4.5 02/17/2017 0948   CL 101 02/17/2017 0948   CO2 21 02/17/2017 0948   GLUCOSE 88 02/17/2017 0948   GLUCOSE 83 05/27/2012 1119   BUN 18 02/17/2017 0948   CREATININE 0.74 02/17/2017 0948   CREATININE 0.81 05/27/2012 1119   CALCIUM 9.3 02/17/2017 0948   GFRNONAA 96 02/17/2017 0948   GFRAA 111 02/17/2017 0948   Lab Results  Component Value Date   HGBA1C 5.5 02/17/2017   HGBA1C 5.3 10/08/2016   HGBA1C 5.7 (H) 06/25/2016   Lab Results  Component Value Date   INSULIN 12.3 02/17/2017   INSULIN 9.7 10/08/2016   INSULIN 12.7 06/25/2016   CBC    Component Value Date/Time   WBC 8.3 06/25/2016 1053   WBC 6.7 05/27/2012 1119   RBC 4.15 06/25/2016 1053   RBC 4.46 05/27/2012 1119   HGB 12.2 06/25/2016 1053   HCT 37.1 06/25/2016 1053   PLT 317 05/27/2012 1119   MCV 89 06/25/2016 1053   MCH 29.4 06/25/2016 1053   MCH 30.7 05/27/2012 1119   MCHC 32.9 06/25/2016 1053    MCHC 33.7 05/27/2012 1119   RDW 14.9 06/25/2016 1053   LYMPHSABS 2.4 06/25/2016 1053   MONOABS 0.4 05/27/2012 1119   EOSABS 0.2 06/25/2016 1053   BASOSABS 0.0 06/25/2016 1053   Iron/TIBC/Ferritin/ %Sat No results found for: IRON, TIBC, FERRITIN, IRONPCTSAT Lipid Panel     Component Value Date/Time   CHOL 178 10/08/2016 1025   TRIG 75 10/08/2016 1025   HDL 39 (L) 10/08/2016 1025   LDLCALC 124 (H) 10/08/2016 1025   Hepatic Function Panel     Component Value Date/Time   PROT 6.8 02/17/2017 0948   ALBUMIN 4.5 02/17/2017 0948   AST 12 02/17/2017 0948   ALT 12  02/17/2017 0948   ALKPHOS 61 02/17/2017 0948   BILITOT 0.3 02/17/2017 0948      Component Value Date/Time   TSH 1.500 10/08/2016 1025   TSH 1.630 06/25/2016 1053    ASSESSMENT AND PLAN: Lower extremity edema - Bilateral  Other depression - with emotional eating - Plan: buPROPion (WELLBUTRIN SR) 150 MG 12 hr tablet  At risk for diabetes mellitus  Class 2 severe obesity with serious comorbidity and body mass index (BMI) of 36.0 to 36.9 in adult, unspecified obesity type ([facility])  PLAN:  Bilateral Lower Extremity Edema Zayli agrees to discontinue hydrochlorothiazide and continue with diet and exercise. Rosea agrees to follow up with our clinic in 2 weeks and we will recheck blood pressure at that time.  Diabetes risk counselling Jordann was given extended (15 minutes) diabetes prevention counseling today. She is 46 y.o. female and has risk factors for diabetes including obesity. We discussed intensive lifestyle modifications today with an emphasis on weight loss as well as increasing exercise and decreasing simple carbohydrates in her diet.  Depression with Emotional Eating Behaviors We discussed behavior modification techniques today to help Darla deal with her emotional eating and depression. Lynia agrees to continue taking Wellbutrin SR 150 mg qd #30 and we will refill for 1 month. Latarsha agrees to follow up with  our clinic in 2 weeks.   Obesity Vonetta is currently in the action stage of change. As such, her goal is to continue with weight loss efforts She has agreed to keep a food journal with 1100-1300 calories and 70+ grams of protein daily Jeremie has been instructed to work up to a goal of 150 minutes of combined cardio and strengthening exercise per week for weight loss and overall health benefits. We discussed the following Behavioral Modification Strategies today: decreasing sodium intake, work on meal planning and easy cooking plans, and keep a strict food journal   Maddelyn has agreed to follow up with our clinic in 2 weeks. She was informed of the importance of frequent follow up visits to maximize her success with intensive lifestyle modifications for her multiple health conditions.   OBESITY BEHAVIORAL INTERVENTION VISIT  Today's visit was # 16 out of 22.  Starting weight: 219 lbs Starting date: 06/25/16 Today's weight : 200 lbs  Today's date: 03/17/2017 Total lbs lost to date: 35 (Patients must lose 7 lbs in the first 6 months to continue with counseling)   ASK: We discussed the diagnosis of obesity with Alben Spittle today and Araseli agreed to give [name] permission to discuss obesity behavioral modification therapy today.  ASSESS: Ione has the diagnosis of obesity and her BMI today is 36.57 Jenniffer is in the action stage of change   ADVISE: Syesha was educated on the multiple health risks of obesity as well as the benefit of weight loss to improve her health. She was advised of the need for long term treatment and the importance of lifestyle modifications.  AGREE: Multiple dietary modification options and treatment options were discussed and  Charnell agreed to the above obesity treatment plan.  I, Trixie Dredge, am acting as transcriptionist for Dennard Nip, MD  I have reviewed the above documentation for accuracy and completeness, and I agree with the above. -Dennard Nip,  MD

## 2017-04-01 ENCOUNTER — Ambulatory Visit (INDEPENDENT_AMBULATORY_CARE_PROVIDER_SITE_OTHER): Payer: BC Managed Care – PPO | Admitting: Family Medicine

## 2017-04-01 VITALS — BP 124/82 | HR 70 | Temp 97.8°F | Ht 62.0 in | Wt 200.0 lb

## 2017-04-01 DIAGNOSIS — F3289 Other specified depressive episodes: Secondary | ICD-10-CM | POA: Diagnosis not present

## 2017-04-01 DIAGNOSIS — R7303 Prediabetes: Secondary | ICD-10-CM

## 2017-04-01 DIAGNOSIS — Z9189 Other specified personal risk factors, not elsewhere classified: Secondary | ICD-10-CM | POA: Diagnosis not present

## 2017-04-01 DIAGNOSIS — Z6836 Body mass index (BMI) 36.0-36.9, adult: Secondary | ICD-10-CM | POA: Diagnosis not present

## 2017-04-01 DIAGNOSIS — E66812 Obesity, class 2: Secondary | ICD-10-CM

## 2017-04-01 MED ORDER — BUPROPION HCL ER (SR) 150 MG PO TB12: 150.0000 mg | ORAL_TABLET | Freq: Every day | ORAL | 0 refills | Status: DC

## 2017-04-01 MED ORDER — METFORMIN HCL 500 MG PO TABS: 500.0000 mg | ORAL_TABLET | Freq: Two times a day (BID) | ORAL | 0 refills | Status: DC

## 2017-04-02 NOTE — Progress Notes (Signed)
Office: [phone]  /  Fax: [phone]   HPI:   Chief Complaint: OBESITY Grace Horton is here to discuss her progress with her obesity treatment plan. She is on the  keep a food journal with 100 to 1300 calories and 70+ grams of protein daily and is following her eating plan approximately 100 % of the time. She states she is exercising on recumbent bike and hiking at the park for 60 minutes 5 times per week. Grace Horton is journaling most days, but she is not always meeting her calorie goals. Her weight is 200 lb (90.7 kg) today and has maintained weight over a period of 2 weeks since her last visit. She has lost 19 lbs since starting treatment with [name].  Pre-Diabetes Grace Horton has a diagnosis of pre-diabetes based on her elevated Hgb A1c and was informed this puts her at greater risk of developing diabetes. Her A1c is improving with metformin and diet. Grace Horton continues to work on diet and exercise to decrease risk of diabetes and is doing well overall on diet and exercise.. She denies nausea, vomiting or hypoglycemia.  At risk for diabetes Grace Horton is at higher than average risk for developing diabetes due to her obesity and pre-diabetes. She currently denies polyuria or polydipsia.  Grace Horton still notes exercise intolerance, but this is not severe. She notes getting out of breath sooner with activity than she used to. Zaylei denies shortness of breath at rest or orthopnea.  Depression with emotional eating behaviors Grace Horton mood is stable on Wellbutrin. She feels more in control of her emotional eating. Grace Horton has no worsening insomnia and blood pressure is stable. Grace Horton struggles with emotional eating and using food for comfort to the extent that it is negatively impacting her health. She often snacks when she is not hungry. Grace Horton sometimes feels she is out of control and then feels guilty that she made poor food choices. She has been working on behavior modification techniques to  help reduce her emotional eating and has been somewhat successful. She shows no sign of suicidal or homicidal ideations.  Depression screen PHQ 2/9 06/25/2016  Decreased Interest 3  Down, Depressed, Hopeless 1  PHQ - 2 Score 4  Altered sleeping 0  Tired, decreased energy 3  Change in appetite 2  Feeling bad or failure about yourself  1  Trouble concentrating 1  Moving slowly or fidgety/restless 3  Suicidal thoughts 0  PHQ-9 Score 14      ALLERGIES: No Known Allergies  MEDICATIONS: Current Outpatient Medications on File Prior to Visit  Medication Sig Dispense Refill  . Diclofenac Sodium (PENNSAID) 2 % SOLN Fingertip amount to affected area twice daily 1 Bottle 2  . levonorgestrel-ethinyl estradiol (AVIANE,ALESSE,LESSINA) 0.1-20 MG-MCG tablet Take 1 tablet by mouth daily.    . Turmeric 450 MG CAPS Take by mouth at bedtime.    . Vitamin D, Ergocalciferol, (DRISDOL) 50000 units CAPS capsule Take 1 capsule (50,000 Units total) by mouth every 7 (seven) days. 4 capsule 0   No current facility-administered medications on file prior to visit.     PAST MEDICAL HISTORY: Past Medical History:  Diagnosis Date  . AC (acromioclavicular) joint bone spurs   . Anxiety   . Back pain   . Chondromalacia   . Depression   . GERD (gastroesophageal reflux disease)   . History of shingles 2011   Around Left Eye  . Joint pain   . Raynaud disease   . Swelling    in feet  or legs  . Vitamin D deficiency     PAST SURGICAL HISTORY: Past Surgical History:  Procedure Laterality Date  . CESAREAN SECTION    . SHOULDER SURGERY Left 2007  . TONSILLECTOMY      SOCIAL HISTORY: Social History   Tobacco Use  . Smoking status: Never Smoker  . Smokeless tobacco: Never Used  Substance Use Topics  . Alcohol use: No  . Drug use: No    FAMILY HISTORY: Family History  Problem Relation Age of Onset  . Heart disease Father   . Diabetes Father   . Kidney disease Father        Stage III   .  Transient ischemic attack Father        S/P Carotid endarterectomy  . Hypertension Father   . Hyperlipidemia Father   . Irritable bowel syndrome Mother   . Hypertension Mother   . Hyperlipidemia Mother   . Obesity Mother     ROS: Review of Systems  Constitutional: Negative for weight loss.  Respiratory: Positive for shortness of breath (with exercise).        Negative for shortness of breath at rest  Cardiovascular: Negative for orthopnea.  Gastrointestinal: Negative for nausea and vomiting.  Genitourinary: Negative for frequency.  Endo/Heme/Allergies: Negative for polydipsia.       Negative for hypoglycemia   Psychiatric/Behavioral: Positive for depression. Negative for suicidal ideas. The patient does not have insomnia.     PHYSICAL EXAM: Blood pressure 124/82, pulse 70, temperature 97.8 F (36.6 C), temperature source Oral, height 5\' 2"  (1.575 m), weight 200 lb (90.7 kg), SpO2 99 %. Body mass index is 36.58 kg/m. Physical Exam  Constitutional: She is oriented to person, place, and time. She appears well-developed and well-nourished.  Cardiovascular: Normal rate.  Pulmonary/Chest: Effort normal.  Musculoskeletal: Normal range of motion.  Neurological: She is oriented to person, place, and time.  Skin: Skin is warm and dry.  Psychiatric: She has a normal mood and affect. Her behavior is normal.  Vitals reviewed.   RECENT LABS AND TESTS: BMET    Component Value Date/Time   NA 141 02/17/2017 0948   K 4.5 02/17/2017 0948   CL 101 02/17/2017 0948   CO2 21 02/17/2017 0948   GLUCOSE 88 02/17/2017 0948   GLUCOSE 83 05/27/2012 1119   BUN 18 02/17/2017 0948   CREATININE 0.74 02/17/2017 0948   CREATININE 0.81 05/27/2012 1119   CALCIUM 9.3 02/17/2017 0948   GFRNONAA 96 02/17/2017 0948   GFRAA 111 02/17/2017 0948   Lab Results  Component Value Date   HGBA1C 5.5 02/17/2017   HGBA1C 5.3 10/08/2016   HGBA1C 5.7 (H) 06/25/2016   Lab Results  Component Value Date    INSULIN 12.3 02/17/2017   INSULIN 9.7 10/08/2016   INSULIN 12.7 06/25/2016   CBC    Component Value Date/Time   WBC 8.3 06/25/2016 1053   WBC 6.7 05/27/2012 1119   RBC 4.15 06/25/2016 1053   RBC 4.46 05/27/2012 1119   HGB 12.2 06/25/2016 1053   HCT 37.1 06/25/2016 1053   PLT 317 05/27/2012 1119   MCV 89 06/25/2016 1053   MCH 29.4 06/25/2016 1053   MCH 30.7 05/27/2012 1119   MCHC 32.9 06/25/2016 1053   MCHC 33.7 05/27/2012 1119   RDW 14.9 06/25/2016 1053   LYMPHSABS 2.4 06/25/2016 1053   MONOABS 0.4 05/27/2012 1119   EOSABS 0.2 06/25/2016 1053   BASOSABS 0.0 06/25/2016 1053   Iron/TIBC/Ferritin/ %Sat No results found for:  IRON, TIBC, FERRITIN, IRONPCTSAT Lipid Panel     Component Value Date/Time   CHOL 178 10/08/2016 1025   TRIG 75 10/08/2016 1025   HDL 39 (L) 10/08/2016 1025   LDLCALC 124 (H) 10/08/2016 1025   Hepatic Function Panel     Component Value Date/Time   PROT 6.8 02/17/2017 0948   ALBUMIN 4.5 02/17/2017 0948   AST 12 02/17/2017 0948   ALT 12 02/17/2017 0948   ALKPHOS 61 02/17/2017 0948   BILITOT 0.3 02/17/2017 0948      Component Value Date/Time   TSH 1.500 10/08/2016 1025   TSH 1.630 06/25/2016 1053    ASSESSMENT AND PLAN: Prediabetes - Plan: metFORMIN (GLUCOPHAGE) 500 MG tablet  Other depression - with emotional eating - Plan: buPROPion (WELLBUTRIN SR) 150 MG 12 hr tablet  At risk for diabetes mellitus  Class 2 severe obesity with serious comorbidity and body mass index (BMI) of 36.0 to 36.9 in adult, unspecified obesity type ([place])  PLAN:  Pre-Diabetes Latonga will continue to work on weight loss, exercise, and decreasing simple carbohydrates in her diet to help decrease the risk of diabetes. We dicussed metformin including benefits and risks. She was informed that eating too many simple carbohydrates or too many calories at one sitting increases the likelihood of GI side effects. Klara requested metformin for now and a prescription was  written today for 1 month refill. Teliyah agreed to follow up with [name] as directed to monitor her progress.  Diabetes risk counseling Maryn was given extended (15 minutes) diabetes prevention counseling today. She is 47 y.o. female and has risk factors for diabetes including obesity and pre-diabetes. We discussed intensive lifestyle modifications today with an emphasis on weight loss as well as increasing exercise and decreasing simple carbohydrates in her diet.  Grace with exercise Repeat indirect calorimetry today shows low RMR, which suggests she needs to concentrate on strengthening exercises, and work to not decrease calories and protein too low.   Depression with Emotional Eating Behaviors We discussed behavior modification techniques today to help Kawthar deal with her emotional eating and depression. She has agreed to continue Wellbutrin SR 150 mg qd #30 with no refills and follow up as directed.  Obesity Alizon is currently in the action stage of change. As such, her goal is to continue with weight loss efforts She has agreed to change to a lower carbohydrate, vegetable and lean protein rich diet plan Saory has been instructed to work up to a goal of 150 minutes of combined cardio and strengthening exercise per week for weight loss and overall health benefits. We discussed the following Behavioral Modification Strategies today: increase H2O intake, increasing lean protein intake, decreasing simple carbohydrates  and increasing vegetables  Tamzin has agreed to follow up with our clinic in 2 weeks. She was informed of the importance of frequent follow up visits to maximize her success with intensive lifestyle modifications for her multiple health conditions.   OBESITY BEHAVIORAL INTERVENTION VISIT  Today's visit was # 17 out of 22.  Starting weight: 219 lbs Starting date: 06/25/16 Today's weight : 200 lbs Today's date: 04/01/2017 Total lbs lost to date: 5 (Patients must lose 7  lbs in the first 6 months to continue with counseling)   ASK: We discussed the diagnosis of obesity with Alben Spittle today and Mariposa agreed to give [name] permission to discuss obesity behavioral modification therapy today.  ASSESS: Jayda has the diagnosis of obesity and her BMI today is 36.57 Lilliahna is in the  action stage of change   ADVISE: Shadasia was educated on the multiple health risks of obesity as well as the benefit of weight loss to improve her health. She was advised of the need for long term treatment and the importance of lifestyle modifications.  AGREE: Multiple dietary modification options and treatment options were discussed and  Pria agreed to the above obesity treatment plan.  I, Doreene Nest, am acting as transcriptionist for Dennard Nip, MD  I have reviewed the above documentation for accuracy and completeness, and I agree with the above. -Dennard Nip, MD

## 2017-04-08 ENCOUNTER — Encounter (INDEPENDENT_AMBULATORY_CARE_PROVIDER_SITE_OTHER): Payer: Self-pay | Admitting: Family Medicine

## 2017-04-15 ENCOUNTER — Ambulatory Visit (INDEPENDENT_AMBULATORY_CARE_PROVIDER_SITE_OTHER): Payer: BC Managed Care – PPO | Admitting: Family Medicine

## 2017-04-15 VITALS — BP 123/81 | HR 67 | Temp 98.0°F | Ht 62.0 in | Wt 196.0 lb

## 2017-04-15 DIAGNOSIS — R600 Localized edema: Secondary | ICD-10-CM | POA: Diagnosis not present

## 2017-04-15 DIAGNOSIS — Z6835 Body mass index (BMI) 35.0-35.9, adult: Secondary | ICD-10-CM

## 2017-04-15 NOTE — Progress Notes (Signed)
Office: [phone]  /  Fax: [phone]   HPI:   Chief Complaint: OBESITY Grace Horton is here to discuss her progress with her obesity treatment plan. She is on the lower carbohydrate, vegetable and lean protein rich diet plan and is following her eating plan approximately 99 % of the time. She states she is walking and lift weights for 30-60 minutes 4 times per week. Grace Horton has done well with weight loss on the low carbohydrate plan, but noted increase irritability especially bad on day 6. She has been doing better overall.  Her weight is 196 lb (88.9 kg) today and has had a weight loss of 4 pounds over a period of 2 weeks since her last visit. She has lost 23 lbs since starting treatment with [name].  Lower Extremity Edema Grace Horton has been off hydrochlorothiazide for 1 month and is doing well with decreasing edema while on her lower carbohydrate plan. She denies headache and blood pressure is stable.  ALLERGIES: No Known Allergies  MEDICATIONS: Current Outpatient Medications on File Prior to Visit  Medication Sig Dispense Refill  . buPROPion (WELLBUTRIN SR) 150 MG 12 hr tablet Take 1 tablet (150 mg total) by mouth daily. 30 tablet 0  . Diclofenac Sodium (PENNSAID) 2 % SOLN Fingertip amount to affected area twice daily 1 Bottle 2  . levonorgestrel-ethinyl estradiol (AVIANE,ALESSE,LESSINA) 0.1-20 MG-MCG tablet Take 1 tablet by mouth daily.    . metFORMIN (GLUCOPHAGE) 500 MG tablet Take 1 tablet (500 mg total) by mouth 2 (two) times daily with a meal. 60 tablet 0  . Turmeric 450 MG CAPS Take by mouth at bedtime.    . Vitamin D, Ergocalciferol, (DRISDOL) 50000 units CAPS capsule Take 1 capsule (50,000 Units total) by mouth every 7 (seven) days. 4 capsule 0   No current facility-administered medications on file prior to visit.     PAST MEDICAL HISTORY: Past Medical History:  Diagnosis Date  . AC (acromioclavicular) joint bone spurs   . Anxiety   . Back pain   . Chondromalacia   .  Depression   . GERD (gastroesophageal reflux disease)   . History of shingles 2011   Around Left Eye  . Joint pain   . Raynaud disease   . Swelling    in feet or legs  . Vitamin D deficiency     PAST SURGICAL HISTORY: Past Surgical History:  Procedure Laterality Date  . CESAREAN SECTION    . SHOULDER SURGERY Left 2007  . TONSILLECTOMY      SOCIAL HISTORY: Social History   Tobacco Use  . Smoking status: Never Smoker  . Smokeless tobacco: Never Used  Substance Use Topics  . Alcohol use: No  . Drug use: No    FAMILY HISTORY: Family History  Problem Relation Age of Onset  . Heart disease Father   . Diabetes Father   . Kidney disease Father        Stage III   . Transient ischemic attack Father        S/P Carotid endarterectomy  . Hypertension Father   . Hyperlipidemia Father   . Irritable bowel syndrome Mother   . Hypertension Mother   . Hyperlipidemia Mother   . Obesity Mother     ROS: Review of Systems  Constitutional: Positive for weight loss.  Neurological: Negative for headaches.    PHYSICAL EXAM: Blood pressure 123/81, pulse 67, temperature 98 F (36.7 C), temperature source Oral, height 5\' 2"  (1.575 m), weight 196 lb (88.9 kg), SpO2  98 %. Body mass index is 35.85 kg/m. Physical Exam  Constitutional: She is oriented to person, place, and time. She appears well-developed and well-nourished.  Cardiovascular: Normal rate.  Pulmonary/Chest: Effort normal.  Musculoskeletal: Normal range of motion.  Neurological: She is oriented to person, place, and time.  Skin: Skin is warm and dry.  Psychiatric: She has a normal mood and affect. Her behavior is normal.  Vitals reviewed.   RECENT LABS AND TESTS: BMET    Component Value Date/Time   NA 141 02/17/2017 0948   K 4.5 02/17/2017 0948   CL 101 02/17/2017 0948   CO2 21 02/17/2017 0948   GLUCOSE 88 02/17/2017 0948   GLUCOSE 83 05/27/2012 1119   BUN 18 02/17/2017 0948   CREATININE 0.74 02/17/2017 0948    CREATININE 0.81 05/27/2012 1119   CALCIUM 9.3 02/17/2017 0948   GFRNONAA 96 02/17/2017 0948   GFRAA 111 02/17/2017 0948   Lab Results  Component Value Date   HGBA1C 5.5 02/17/2017   HGBA1C 5.3 10/08/2016   HGBA1C 5.7 (H) 06/25/2016   Lab Results  Component Value Date   INSULIN 12.3 02/17/2017   INSULIN 9.7 10/08/2016   INSULIN 12.7 06/25/2016   CBC    Component Value Date/Time   WBC 8.3 06/25/2016 1053   WBC 6.7 05/27/2012 1119   RBC 4.15 06/25/2016 1053   RBC 4.46 05/27/2012 1119   HGB 12.2 06/25/2016 1053   HCT 37.1 06/25/2016 1053   PLT 317 05/27/2012 1119   MCV 89 06/25/2016 1053   MCH 29.4 06/25/2016 1053   MCH 30.7 05/27/2012 1119   MCHC 32.9 06/25/2016 1053   MCHC 33.7 05/27/2012 1119   RDW 14.9 06/25/2016 1053   LYMPHSABS 2.4 06/25/2016 1053   MONOABS 0.4 05/27/2012 1119   EOSABS 0.2 06/25/2016 1053   BASOSABS 0.0 06/25/2016 1053   Iron/TIBC/Ferritin/ %Sat No results found for: IRON, TIBC, FERRITIN, IRONPCTSAT Lipid Panel     Component Value Date/Time   CHOL 178 10/08/2016 1025   TRIG 75 10/08/2016 1025   HDL 39 (L) 10/08/2016 1025   LDLCALC 124 (H) 10/08/2016 1025   Hepatic Function Panel     Component Value Date/Time   PROT 6.8 02/17/2017 0948   ALBUMIN 4.5 02/17/2017 0948   AST 12 02/17/2017 0948   ALT 12 02/17/2017 0948   ALKPHOS 61 02/17/2017 0948   BILITOT 0.3 02/17/2017 0948      Component Value Date/Time   TSH 1.500 10/08/2016 1025   TSH 1.630 06/25/2016 1053    ASSESSMENT AND PLAN: Lower extremity edema  Class 2 severe obesity with serious comorbidity and body mass index (BMI) of 35.0 to 35.9 in adult, unspecified obesity type ([facility])  PLAN:  Lower Extremity Edema Grace Horton is to continue diet, exercise, and will continue to monitor. Grace Horton agrees to follow up with our clinic in 2 weeks.  We spent > than 50% of the 15 minute visit on the counseling as documented in the note.  Obesity Grace Horton is currently in the action stage of  change. As such, her goal is to continue with weight loss efforts She has agreed to follow a lower carbohydrate, vegetable and lean protein rich diet plan/Plus 1/2 cup of berries per day Grace Horton has been instructed to work up to a goal of 150 minutes of combined cardio and strengthening exercise per week for weight loss and overall health benefits. We discussed the following Behavioral Modification Strategies today: increasing lean protein intake, decreasing simple carbohydrates, and increase H20  intake    Arkie has agreed to follow up with our clinic in 2 weeks. She was informed of the importance of frequent follow up visits to maximize her success with intensive lifestyle modifications for her multiple health conditions.   OBESITY BEHAVIORAL INTERVENTION VISIT  Today's visit was # 2 out of 22.  Starting weight: 219 lbs Starting date: 06/25/16 Today's weight : 196 lbs  Today's date: 04/15/2017 Total lbs lost to date: 23 (Patients must lose 7 lbs in the first 6 months to continue with counseling)   ASK: We discussed the diagnosis of obesity with Grace Horton today and Grace Horton agreed to give [name] permission to discuss obesity behavioral modification therapy today.  ASSESS: Grace Horton has the diagnosis of obesity and her BMI today is 35.84 Grace Horton is in the action stage of change   ADVISE: Grace Horton was educated on the multiple health risks of obesity as well as the benefit of weight loss to improve her health. She was advised of the need for long term treatment and the importance of lifestyle modifications.  AGREE: Multiple dietary modification options and treatment options were discussed and  Grace Horton agreed to the above obesity treatment plan.  I, Trixie Dredge, am acting as transcriptionist for Dennard Nip, MD  I have reviewed the above documentation for accuracy and completeness, and I agree with the above. -Dennard Nip, MD

## 2017-04-30 ENCOUNTER — Ambulatory Visit (INDEPENDENT_AMBULATORY_CARE_PROVIDER_SITE_OTHER): Payer: BC Managed Care – PPO | Admitting: Family Medicine

## 2017-04-30 VITALS — BP 124/80 | HR 67 | Temp 98.0°F | Ht 62.0 in | Wt 193.0 lb

## 2017-04-30 DIAGNOSIS — R7303 Prediabetes: Secondary | ICD-10-CM | POA: Diagnosis not present

## 2017-04-30 DIAGNOSIS — Z9189 Other specified personal risk factors, not elsewhere classified: Secondary | ICD-10-CM

## 2017-04-30 DIAGNOSIS — Z6835 Body mass index (BMI) 35.0-35.9, adult: Secondary | ICD-10-CM | POA: Diagnosis not present

## 2017-04-30 DIAGNOSIS — F3289 Other specified depressive episodes: Secondary | ICD-10-CM | POA: Diagnosis not present

## 2017-04-30 MED ORDER — METFORMIN HCL 500 MG PO TABS: 500.0000 mg | ORAL_TABLET | Freq: Two times a day (BID) | ORAL | 0 refills | Status: DC

## 2017-04-30 MED ORDER — BUPROPION HCL ER (SR) 150 MG PO TB12: 150.0000 mg | ORAL_TABLET | Freq: Every day | ORAL | 0 refills | Status: DC

## 2017-04-30 NOTE — Progress Notes (Signed)
Office: [phone]  /  Fax: [phone]   HPI:   Chief Complaint: OBESITY Grace Horton is here to discuss her progress with her obesity treatment plan. She is on the lower carbohydrate, vegetable and lean protein rich diet plan and is following her eating plan approximately 90 % of the time. She states she is walking and weights for 30-60 minutes 4-5 times per week. Grace Horton continues to do well with weight loss on her low carbohydrate plan. She deviated from her plan for her birthday. She is walking hills for exercise and upper body exercises.  Her weight is 193 lb (87.5 kg) today and has had a weight loss of 5 pounds over a period of 2 weeks since her last visit. She has lost 26 lbs since starting treatment with [name].  Pre-Diabetes Grace Horton has a diagnosis of pre-diabetes based on her elevated Hgb A1c and was informed this puts her at greater risk of developing diabetes. She is doing well on diet prescription and with metformin and continues to work on diet and exercise to decrease risk of diabetes. She denies nausea, vomiting, or hypoglycemia.  At risk for diabetes Grace Horton is at higher than average risk for developing diabetes due to her obesity and pre-diabetes. She currently denies polyuria or polydipsia.  Depression with emotional eating behaviors Grace Horton's mood is stable on Wellbutrin, she has decreased emotional eating and she denies insomnia, blood pressure is stable. Grace Horton struggles with emotional eating and using food for comfort to the extent that it is negatively impacting her health. She often snacks when she is not hungry. Grace Horton sometimes feels she is out of control and then feels guilty that she made poor food choices. She has been working on behavior modification techniques to help reduce her emotional eating and has been somewhat successful. She shows no sign of suicidal or homicidal ideations.  Depression screen PHQ 2/9 06/25/2016  Decreased Interest 3  Down, Depressed, Hopeless 1    PHQ - 2 Score 4  Altered sleeping 0  Tired, decreased energy 3  Change in appetite 2  Feeling bad or failure about yourself  1  Trouble concentrating 1  Moving slowly or fidgety/restless 3  Suicidal thoughts 0  PHQ-9 Score 14   ALLERGIES: No Known Allergies  MEDICATIONS: Current Outpatient Medications on File Prior to Visit  Medication Sig Dispense Refill  . Diclofenac Sodium (PENNSAID) 2 % SOLN Fingertip amount to affected area twice daily 1 Bottle 2  . levonorgestrel-ethinyl estradiol (AVIANE,ALESSE,LESSINA) 0.1-20 MG-MCG tablet Take 1 tablet by mouth daily.    Marland Kitchen MAGNESIUM MALATE PO Take 1,250 mg by mouth 2 (two) times daily.    . Vitamin D, Ergocalciferol, (DRISDOL) 50000 units CAPS capsule Take 1 capsule (50,000 Units total) by mouth every 7 (seven) days. 4 capsule 0   No current facility-administered medications on file prior to visit.     PAST MEDICAL HISTORY: Past Medical History:  Diagnosis Date  . AC (acromioclavicular) joint bone spurs   . Anxiety   . Back pain   . Chondromalacia   . Depression   . GERD (gastroesophageal reflux disease)   . History of shingles 2011   Around Left Eye  . Joint pain   . Raynaud disease   . Swelling    in feet or legs  . Vitamin D deficiency     PAST SURGICAL HISTORY: Past Surgical History:  Procedure Laterality Date  . CESAREAN SECTION    . SHOULDER SURGERY Left 2007  . TONSILLECTOMY  SOCIAL HISTORY: Social History   Tobacco Use  . Smoking status: Never Smoker  . Smokeless tobacco: Never Used  Substance Use Topics  . Alcohol use: No  . Drug use: No    FAMILY HISTORY: Family History  Problem Relation Age of Onset  . Heart disease Father   . Diabetes Father   . Kidney disease Father        Stage III   . Transient ischemic attack Father        S/P Carotid endarterectomy  . Hypertension Father   . Hyperlipidemia Father   . Irritable bowel syndrome Mother   . Hypertension Mother   . Hyperlipidemia  Mother   . Obesity Mother     ROS: Review of Systems  Constitutional: Positive for weight loss.  Gastrointestinal: Negative for nausea and vomiting.  Genitourinary: Negative for frequency.  Endo/Heme/Allergies: Negative for polydipsia.       Negative hypoglycemia  Psychiatric/Behavioral: Positive for depression. Negative for suicidal ideas.    PHYSICAL EXAM: Blood pressure 124/80, pulse 67, temperature 98 F (36.7 C), temperature source Oral, height 5\' 2"  (1.575 m), weight 193 lb (87.5 kg), SpO2 99 %. Body mass index is 35.3 kg/m. Physical Exam  Constitutional: She is oriented to person, place, and time. She appears well-developed and well-nourished.  Cardiovascular: Normal rate.  Pulmonary/Chest: Effort normal.  Musculoskeletal: Normal range of motion.  Neurological: She is oriented to person, place, and time.  Skin: Skin is warm and dry.  Psychiatric: She has a normal mood and affect. Her behavior is normal.  Vitals reviewed.   RECENT LABS AND TESTS: BMET    Component Value Date/Time   NA 141 02/17/2017 0948   K 4.5 02/17/2017 0948   CL 101 02/17/2017 0948   CO2 21 02/17/2017 0948   GLUCOSE 88 02/17/2017 0948   GLUCOSE 83 05/27/2012 1119   BUN 18 02/17/2017 0948   CREATININE 0.74 02/17/2017 0948   CREATININE 0.81 05/27/2012 1119   CALCIUM 9.3 02/17/2017 0948   GFRNONAA 96 02/17/2017 0948   GFRAA 111 02/17/2017 0948   Lab Results  Component Value Date   HGBA1C 5.5 02/17/2017   HGBA1C 5.3 10/08/2016   HGBA1C 5.7 (H) 06/25/2016   Lab Results  Component Value Date   INSULIN 12.3 02/17/2017   INSULIN 9.7 10/08/2016   INSULIN 12.7 06/25/2016   CBC    Component Value Date/Time   WBC 8.3 06/25/2016 1053   WBC 6.7 05/27/2012 1119   RBC 4.15 06/25/2016 1053   RBC 4.46 05/27/2012 1119   HGB 12.2 06/25/2016 1053   HCT 37.1 06/25/2016 1053   PLT 317 05/27/2012 1119   MCV 89 06/25/2016 1053   MCH 29.4 06/25/2016 1053   MCH 30.7 05/27/2012 1119   MCHC 32.9  06/25/2016 1053   MCHC 33.7 05/27/2012 1119   RDW 14.9 06/25/2016 1053   LYMPHSABS 2.4 06/25/2016 1053   MONOABS 0.4 05/27/2012 1119   EOSABS 0.2 06/25/2016 1053   BASOSABS 0.0 06/25/2016 1053   Iron/TIBC/Ferritin/ %Sat No results found for: IRON, TIBC, FERRITIN, IRONPCTSAT Lipid Panel     Component Value Date/Time   CHOL 178 10/08/2016 1025   TRIG 75 10/08/2016 1025   HDL 39 (L) 10/08/2016 1025   LDLCALC 124 (H) 10/08/2016 1025   Hepatic Function Panel     Component Value Date/Time   PROT 6.8 02/17/2017 0948   ALBUMIN 4.5 02/17/2017 0948   AST 12 02/17/2017 0948   ALT 12 02/17/2017 0948   ALKPHOS  61 02/17/2017 0948   BILITOT 0.3 02/17/2017 0948      Component Value Date/Time   TSH 1.500 10/08/2016 1025   TSH 1.630 06/25/2016 1053    ASSESSMENT AND PLAN: Prediabetes - Plan: metFORMIN (GLUCOPHAGE) 500 MG tablet  Other depression - with emotional eating - Plan: buPROPion (WELLBUTRIN SR) 150 MG 12 hr tablet  At risk for diabetes mellitus  Class 2 severe obesity with serious comorbidity and body mass index (BMI) of 35.0 to 35.9 in adult, unspecified obesity type (Grace Horton)  PLAN:  Pre-Diabetes Grace Horton will continue to work on weight loss, diet, exercise, and decreasing simple carbohydrates in her diet to help decrease the risk of diabetes. We dicussed metformin including benefits and risks. She was informed that eating too many simple carbohydrates or too many calories at one sitting increases the likelihood of GI side effects. Tokiko agrees to continue taking metformin 500 mg BID #60 and we will refill for 1 month. Grace Horton agrees to follow up with our clinic in 2 to 3 weeks as directed to monitor her progress.  Diabetes risk counselling Grace Horton was given extended (15 minutes) diabetes prevention counseling today. She is 47 y.o. female and has risk factors for diabetes including obesity and pre-diabetes. We discussed intensive lifestyle modifications today with an emphasis on  weight loss as well as increasing exercise and decreasing simple carbohydrates in her diet.  Depression with Emotional Eating Behaviors We discussed behavior modification techniques today to help Grace Horton deal with her emotional eating and depression. Grace Horton agrees to continue taking Wellbutrin SR 150 mg qd #30 and we will refill for 1 month. Grace Horton agrees to follow up with our clinic in 2 to 3 weeks.  Obesity Grace Horton is currently in the action stage of change. As such, her goal is to continue with weight loss efforts She has agreed to follow a lower carbohydrate, vegetable and lean protein rich diet plan Grace Horton has been instructed to work up to a goal of 150 minutes of combined cardio and strengthening exercise per week for weight loss and overall health benefits. We discussed the following Behavioral Modification Strategies today: increasing lean protein intake, decreasing simple carbohydrates, and increase H20 intake    Grace Horton has agreed to follow up with our clinic in 2 to 3 weeks. She was informed of the importance of frequent follow up visits to maximize her success with intensive lifestyle modifications for her multiple health conditions.   OBESITY BEHAVIORAL INTERVENTION VISIT  Today's visit was # 19 out of 22.  Starting weight: 219 lbs Starting date: 06/25/16 Today's weight : 193 lbs Today's date: 04/30/2017 Total lbs lost to date: 42 (Patients must lose 7 lbs in the first 6 months to continue with counseling)   ASK: We discussed the diagnosis of obesity with Alben Spittle today and Maryjo agreed to give [name] permission to discuss obesity behavioral modification therapy today.  ASSESS: Tagen has the diagnosis of obesity and her BMI today is 35.29 Makayela is in the action stage of change   ADVISE: Kadie was educated on the multiple health risks of obesity as well as the benefit of weight loss to improve her health. She was advised of the need for long term treatment and the  importance of lifestyle modifications.  AGREE: Multiple dietary modification options and treatment options were discussed and  Erina agreed to the above obesity treatment plan.  I, Trixie Dredge, am acting as transcriptionist for Dennard Nip, MD  I have reviewed the above documentation for accuracy and  completeness, and I agree with the above. -Dennard Nip, MD

## 2017-05-06 ENCOUNTER — Other Ambulatory Visit (INDEPENDENT_AMBULATORY_CARE_PROVIDER_SITE_OTHER): Payer: Self-pay | Admitting: Family Medicine

## 2017-05-06 DIAGNOSIS — R7303 Prediabetes: Secondary | ICD-10-CM

## 2017-05-21 ENCOUNTER — Ambulatory Visit (INDEPENDENT_AMBULATORY_CARE_PROVIDER_SITE_OTHER): Payer: BC Managed Care – PPO | Admitting: Family Medicine

## 2017-05-21 VITALS — BP 112/74 | HR 71 | Temp 98.0°F | Ht 62.0 in | Wt 190.0 lb

## 2017-05-21 DIAGNOSIS — E669 Obesity, unspecified: Secondary | ICD-10-CM

## 2017-05-21 DIAGNOSIS — Z6834 Body mass index (BMI) 34.0-34.9, adult: Secondary | ICD-10-CM

## 2017-05-21 DIAGNOSIS — K5909 Other constipation: Secondary | ICD-10-CM | POA: Diagnosis not present

## 2017-05-21 MED ORDER — POLYETHYLENE GLYCOL 3350 17 GM/SCOOP PO POWD: 17.0000 g | Freq: Every day | ORAL | 0 refills | Status: DC

## 2017-05-21 NOTE — Progress Notes (Signed)
Office: [phone]  /  Fax: [phone]   HPI:   Chief Complaint: OBESITY Grace Horton is here to discuss her progress with her obesity treatment plan. She is on the lower carbohydrate, vegetable and lean protein rich diet plan and is following her eating plan approximately 98 % of the time. She states she is exercising 60 minutes 3-4 times per week. Grace Horton continues to do well with weight loss on low carbohydrate plan. She is doing well with planning meals ahead of time and hunger is controlled. She continues walking for exercise.  Her weight is 190 lb (86.2 kg) today and has had a weight loss of 3 pounds over a period of 3 weeks since her last visit. She has lost 29 lbs since starting treatment with [name].  Constipation Grace Horton has a new diagnosis of constipation. She notes decreased BM frequency over the last week and have become more uncomfortable. She has taken milk of magnesium but with no improvement. She denies rectal bleeding. She admits to drinking less H20 recently.  ALLERGIES: No Known Allergies  MEDICATIONS: Current Outpatient Medications on File Prior to Visit  Medication Sig Dispense Refill  . buPROPion (WELLBUTRIN SR) 150 MG 12 hr tablet Take 1 tablet (150 mg total) by mouth daily. 30 tablet 0  . Diclofenac Sodium (PENNSAID) 2 % SOLN Fingertip amount to affected area twice daily 1 Bottle 2  . levonorgestrel-ethinyl estradiol (AVIANE,ALESSE,LESSINA) 0.1-20 MG-MCG tablet Take 1 tablet by mouth daily.    Marland Kitchen MAGNESIUM MALATE PO Take 1,250 mg by mouth 2 (two) times daily.    . metFORMIN (GLUCOPHAGE) 500 MG tablet Take 1 tablet (500 mg total) by mouth 2 (two) times daily with a meal. 60 tablet 0  . Vitamin D, Ergocalciferol, (DRISDOL) 50000 units CAPS capsule Take 1 capsule (50,000 Units total) by mouth every 7 (seven) days. 4 capsule 0   No current facility-administered medications on file prior to visit.     PAST MEDICAL HISTORY: Past Medical History:  Diagnosis Date  . AC  (acromioclavicular) joint bone spurs   . Anxiety   . Back pain   . Chondromalacia   . Depression   . GERD (gastroesophageal reflux disease)   . History of shingles 2011   Around Left Eye  . Joint pain   . Raynaud disease   . Swelling    in feet or legs  . Vitamin D deficiency     PAST SURGICAL HISTORY: Past Surgical History:  Procedure Laterality Date  . CESAREAN SECTION    . SHOULDER SURGERY Left 2007  . TONSILLECTOMY      SOCIAL HISTORY: Social History   Tobacco Use  . Smoking status: Never Smoker  . Smokeless tobacco: Never Used  Substance Use Topics  . Alcohol use: No  . Drug use: No    FAMILY HISTORY: Family History  Problem Relation Age of Onset  . Heart disease Father   . Diabetes Father   . Kidney disease Father        Stage III   . Transient ischemic attack Father        S/P Carotid endarterectomy  . Hypertension Father   . Hyperlipidemia Father   . Irritable bowel syndrome Mother   . Hypertension Mother   . Hyperlipidemia Mother   . Obesity Mother     ROS: Review of Systems  Constitutional: Positive for weight loss.  Gastrointestinal: Positive for constipation.       Negative rectal bleeding    PHYSICAL EXAM: Blood  pressure 112/74, pulse 71, temperature 98 F (36.7 C), temperature source Oral, height 5\' 2"  (1.575 m), weight 190 lb (86.2 kg), SpO2 99 %. Body mass index is 34.75 kg/m. Physical Exam  Constitutional: She is oriented to person, place, and time. She appears well-developed and well-nourished.  Cardiovascular: Normal rate.  Pulmonary/Chest: Effort normal.  Musculoskeletal: Normal range of motion.  Neurological: She is oriented to person, place, and time.  Skin: Skin is warm and dry.  Psychiatric: She has a normal mood and affect. Her behavior is normal.  Vitals reviewed.   RECENT LABS AND TESTS: BMET    Component Value Date/Time   NA 141 02/17/2017 0948   K 4.5 02/17/2017 0948   CL 101 02/17/2017 0948   CO2 21  02/17/2017 0948   GLUCOSE 88 02/17/2017 0948   GLUCOSE 83 05/27/2012 1119   BUN 18 02/17/2017 0948   CREATININE 0.74 02/17/2017 0948   CREATININE 0.81 05/27/2012 1119   CALCIUM 9.3 02/17/2017 0948   GFRNONAA 96 02/17/2017 0948   GFRAA 111 02/17/2017 0948   Lab Results  Component Value Date   HGBA1C 5.5 02/17/2017   HGBA1C 5.3 10/08/2016   HGBA1C 5.7 (H) 06/25/2016   Lab Results  Component Value Date   INSULIN 12.3 02/17/2017   INSULIN 9.7 10/08/2016   INSULIN 12.7 06/25/2016   CBC    Component Value Date/Time   WBC 8.3 06/25/2016 1053   WBC 6.7 05/27/2012 1119   RBC 4.15 06/25/2016 1053   RBC 4.46 05/27/2012 1119   HGB 12.2 06/25/2016 1053   HCT 37.1 06/25/2016 1053   PLT 317 05/27/2012 1119   MCV 89 06/25/2016 1053   MCH 29.4 06/25/2016 1053   MCH 30.7 05/27/2012 1119   MCHC 32.9 06/25/2016 1053   MCHC 33.7 05/27/2012 1119   RDW 14.9 06/25/2016 1053   LYMPHSABS 2.4 06/25/2016 1053   MONOABS 0.4 05/27/2012 1119   EOSABS 0.2 06/25/2016 1053   BASOSABS 0.0 06/25/2016 1053   Iron/TIBC/Ferritin/ %Sat No results found for: IRON, TIBC, FERRITIN, IRONPCTSAT Lipid Panel     Component Value Date/Time   CHOL 178 10/08/2016 1025   TRIG 75 10/08/2016 1025   HDL 39 (L) 10/08/2016 1025   LDLCALC 124 (H) 10/08/2016 1025   Hepatic Function Panel     Component Value Date/Time   PROT 6.8 02/17/2017 0948   ALBUMIN 4.5 02/17/2017 0948   AST 12 02/17/2017 0948   ALT 12 02/17/2017 0948   ALKPHOS 61 02/17/2017 0948   BILITOT 0.3 02/17/2017 0948      Component Value Date/Time   TSH 1.500 10/08/2016 1025   TSH 1.630 06/25/2016 1053    ASSESSMENT AND PLAN: Other constipation - Plan: polyethylene glycol powder (GLYCOLAX/MIRALAX) powder  Class 1 obesity with serious comorbidity and body mass index (BMI) of 34.0 to 34.9 in adult, unspecified obesity type  PLAN:  Constipation Grace Horton was informed decrease bowel movement frequency is normal while losing weight, but stools  should not be hard or painful. She was advised to increase her H20 intake and work on increasing her fiber intake. High fiber foods were discussed today. Grace Horton agrees to start miralax 17 g q daily for 1 week. Grace Horton agrees to follow up with our clinic in 3 weeks.  Obesity Grace Horton is currently in the action stage of change. As such, her goal is to continue with weight loss efforts She has agreed to follow a lower carbohydrate, vegetable and lean protein rich diet plan Grace Horton has been instructed  to work up to a goal of 150 minutes of combined cardio and strengthening exercise per week for weight loss and overall health benefits. We discussed the following Behavioral Modification Strategies today: increasing fiber rich foods and increase H20 intake   Marshell has agreed to follow up with our clinic in 3 weeks. She was informed of the importance of frequent follow up visits to maximize her success with intensive lifestyle modifications for her multiple health conditions.   OBESITY BEHAVIORAL INTERVENTION VISIT  Today's visit was # 20 out of 22.  Starting weight: 219 lbs Starting date: 06/25/16 Today's weight : 190 lbs Today's date: 05/21/2017 Total lbs lost to date: 29 (Patients must lose 7 lbs in the first 6 months to continue with counseling)   ASK: We discussed the diagnosis of obesity with Grace Horton today and Grace Horton agreed to give [name] permission to discuss obesity behavioral modification therapy today.  ASSESS: Grace Horton has the diagnosis of obesity and her BMI today is 34.74 Grace Horton is in the action stage of change   ADVISE: Grace Horton was educated on the multiple health risks of obesity as well as the benefit of weight loss to improve her health. She was advised of the need for long term treatment and the importance of lifestyle modifications.  AGREE: Multiple dietary modification options and treatment options were discussed and  Grace Horton agreed to the above obesity treatment plan.  I,  Trixie Dredge, am acting as transcriptionist for Dennard Nip, MD  I have reviewed the above documentation for accuracy and completeness, and I agree with the above. -Dennard Nip, MD

## 2017-06-11 ENCOUNTER — Ambulatory Visit (INDEPENDENT_AMBULATORY_CARE_PROVIDER_SITE_OTHER): Payer: BC Managed Care – PPO | Admitting: Family Medicine

## 2017-06-11 VITALS — BP 120/83 | HR 78 | Temp 98.2°F | Ht 62.0 in | Wt 191.0 lb

## 2017-06-11 DIAGNOSIS — R7303 Prediabetes: Secondary | ICD-10-CM

## 2017-06-11 DIAGNOSIS — Z6835 Body mass index (BMI) 35.0-35.9, adult: Secondary | ICD-10-CM

## 2017-06-11 DIAGNOSIS — F3289 Other specified depressive episodes: Secondary | ICD-10-CM

## 2017-06-11 DIAGNOSIS — E66812 Obesity, class 2: Secondary | ICD-10-CM

## 2017-06-11 DIAGNOSIS — Z9189 Other specified personal risk factors, not elsewhere classified: Secondary | ICD-10-CM

## 2017-06-11 MED ORDER — BUPROPION HCL ER (SR) 150 MG PO TB12: 150.0000 mg | ORAL_TABLET | Freq: Every day | ORAL | 0 refills | Status: DC

## 2017-06-11 MED ORDER — METFORMIN HCL 500 MG PO TABS: 500.0000 mg | ORAL_TABLET | Freq: Two times a day (BID) | ORAL | 0 refills | Status: DC

## 2017-06-12 LAB — COMPREHENSIVE METABOLIC PANEL
ALBUMIN: 4 g/dL (ref 3.5–5.5)
ALT: 15 IU/L (ref 0–32)
AST: 13 IU/L (ref 0–40)
Albumin/Globulin Ratio: 1.7 (ref 1.2–2.2)
Alkaline Phosphatase: 58 IU/L (ref 39–117)
BUN/Creatinine Ratio: 18 (ref 9–23)
BUN: 13 mg/dL (ref 6–24)
Bilirubin Total: 0.3 mg/dL (ref 0.0–1.2)
CO2: 20 mmol/L (ref 20–29)
CREATININE: 0.72 mg/dL (ref 0.57–1.00)
Calcium: 8.8 mg/dL (ref 8.7–10.2)
Chloride: 107 mmol/L — ABNORMAL HIGH (ref 96–106)
GFR calc Af Amer: 114 mL/min/{1.73_m2} (ref >59)
GFR, EST NON AFRICAN AMERICAN: 99 mL/min/{1.73_m2} (ref >59)
GLOBULIN, TOTAL: 2.4 g/dL (ref 1.5–4.5)
Glucose: 80 mg/dL (ref 65–99)
POTASSIUM: 4.4 mmol/L (ref 3.5–5.2)
Sodium: 139 mmol/L (ref 134–144)
TOTAL PROTEIN: 6.4 g/dL (ref 6.0–8.5)

## 2017-06-12 LAB — LIPID PANEL WITH LDL/HDL RATIO
Cholesterol, Total: 196 mg/dL (ref 100–199)
HDL: 44 mg/dL (ref >39)
LDL Calculated: 140 mg/dL — ABNORMAL HIGH (ref 0–99)
LDl/HDL Ratio: 3.2 ratio (ref 0.0–3.2)
TRIGLYCERIDES: 61 mg/dL (ref 0–149)
VLDL Cholesterol Cal: 12 mg/dL (ref 5–40)

## 2017-06-12 LAB — VITAMIN D 25 HYDROXY (VIT D DEFICIENCY, FRACTURES): VIT D 25 HYDROXY: 36 ng/mL (ref 30.0–100.0)

## 2017-06-12 LAB — HEMOGLOBIN A1C
ESTIMATED AVERAGE GLUCOSE: 100 mg/dL
HEMOGLOBIN A1C: 5.1 % (ref 4.8–5.6)

## 2017-06-12 LAB — INSULIN, RANDOM: INSULIN: 6.5 u[IU]/mL (ref 2.6–24.9)

## 2017-06-17 NOTE — Progress Notes (Signed)
Office: [phone]  /  Fax: [phone]   HPI:   Chief Complaint: OBESITY Grace Horton is here to discuss her progress with her obesity treatment plan. She is on the lower carbohydrate, vegetable and lean protein rich diet plan and is following her eating plan approximately 85 % of the time. She states she is walking for 60 minutes 3 times per week. Grace Horton is doing well with weight loss, but she is retaining water weight. Grace Horton is doing well overall on the low carb plan, even though she got off tack over Easter. She is getting ready to travel. Her weight is 191 lb (86.6 kg) today and has had a weight gain of 1 pound over a period of 3 to 4 weeks since her last visit. She has lost 28 lbs since starting treatment with [name].  Pre-Diabetes Grace Horton has a diagnosis of prediabetes based on her elevated Hgb A1c and was informed this puts her at greater risk of developing diabetes. Grace Horton is stable on metformin, but she is struggling with her diet recently. and continues to work on diet and exercise to decrease risk of diabetes. Polyphagia is decreased and she denies nausea or hypoglycemia.  At risk for diabetes Grace Horton is at higher than average risk for developing diabetes due to her obesity and pre-diabetes. She currently denies polyuria or polydipsia.  Depression with emotional eating behaviors Grace Horton is still struggling with emotional eating and using food for comfort to the extent that it is negatively impacting her health. She often snacks when she is not hungry. Grace Horton sometimes feels she is out of control and then feels guilty that she made poor food choices. She has been working on behavior modification techniques to help reduce her emotional eating and has been somewhat successful. Her mood is stable on wellbutrin and she shows no sign of suicidal or homicidal ideations.  Depression screen PHQ 2/9 06/25/2016  Decreased Interest 3  Down, Depressed, Hopeless 1  PHQ - 2 Score 4  Altered sleeping 0    Tired, decreased energy 3  Change in appetite 2  Feeling bad or failure about yourself  1  Trouble concentrating 1  Moving slowly or fidgety/restless 3  Suicidal thoughts 0  PHQ-9 Score 14      ALLERGIES: No Known Allergies  MEDICATIONS: Current Outpatient Medications on File Prior to Visit  Medication Sig Dispense Refill  . Diclofenac Sodium (PENNSAID) 2 % SOLN Fingertip amount to affected area twice daily 1 Bottle 2  . levonorgestrel-ethinyl estradiol (AVIANE,ALESSE,LESSINA) 0.1-20 MG-MCG tablet Take 1 tablet by mouth daily.    Marland Kitchen MAGNESIUM MALATE PO Take 1,250 mg by mouth 2 (two) times daily.    . polyethylene glycol powder (GLYCOLAX/MIRALAX) powder Take 17 g by mouth daily. 3350 g 0  . Vitamin D, Ergocalciferol, (DRISDOL) 50000 units CAPS capsule Take 1 capsule (50,000 Units total) by mouth every 7 (seven) days. 4 capsule 0   No current facility-administered medications on file prior to visit.     PAST MEDICAL HISTORY: Past Medical History:  Diagnosis Date  . AC (acromioclavicular) joint bone spurs   . Anxiety   . Back pain   . Chondromalacia   . Depression   . GERD (gastroesophageal reflux disease)   . History of shingles 2011   Around Left Eye  . Joint pain   . Raynaud disease   . Swelling    in feet or legs  . Vitamin D deficiency     PAST SURGICAL HISTORY: Past Surgical History:  Procedure Laterality Date  . CESAREAN SECTION    . SHOULDER SURGERY Left 2007  . TONSILLECTOMY      SOCIAL HISTORY: Social History   Tobacco Use  . Smoking status: Never Smoker  . Smokeless tobacco: Never Used  Substance Use Topics  . Alcohol use: No  . Drug use: No    FAMILY HISTORY: Family History  Problem Relation Age of Onset  . Heart disease Father   . Diabetes Father   . Kidney disease Father        Stage III   . Transient ischemic attack Father        S/P Carotid endarterectomy  . Hypertension Father   . Hyperlipidemia Father   . Irritable bowel  syndrome Mother   . Hypertension Mother   . Hyperlipidemia Mother   . Obesity Mother     ROS: Review of Systems  Constitutional: Negative for weight loss.  Gastrointestinal: Negative for nausea.  Genitourinary: Negative for frequency.  Endo/Heme/Allergies: Negative for polydipsia.       Positive for polyphagia Negative for hypoglycemia  Psychiatric/Behavioral: Positive for depression. Negative for suicidal ideas.    PHYSICAL EXAM: Blood pressure 120/83, pulse 78, temperature 98.2 F (36.8 C), temperature source Oral, height 5\' 2"  (1.575 m), weight 191 lb (86.6 kg), SpO2 97 %. Body mass index is 34.93 kg/m. Physical Exam  Constitutional: She is oriented to person, place, and time. She appears well-developed and well-nourished.  Cardiovascular: Normal rate.  Pulmonary/Chest: Effort normal.  Musculoskeletal: Normal range of motion.  Neurological: She is oriented to person, place, and time.  Skin: Skin is warm and dry.  Psychiatric: She has a normal mood and affect. Her behavior is normal.  Vitals reviewed.   RECENT LABS AND TESTS: BMET    Component Value Date/Time   NA 139 06/11/2017 1434   K 4.4 06/11/2017 1434   CL 107 (H) 06/11/2017 1434   CO2 20 06/11/2017 1434   GLUCOSE 80 06/11/2017 1434   GLUCOSE 83 05/27/2012 1119   BUN 13 06/11/2017 1434   CREATININE 0.72 06/11/2017 1434   CREATININE 0.81 05/27/2012 1119   CALCIUM 8.8 06/11/2017 1434   GFRNONAA 99 06/11/2017 1434   GFRAA 114 06/11/2017 1434   Lab Results  Component Value Date   HGBA1C 5.1 06/11/2017   HGBA1C 5.5 02/17/2017   HGBA1C 5.3 10/08/2016   HGBA1C 5.7 (H) 06/25/2016   Lab Results  Component Value Date   INSULIN 6.5 06/11/2017   INSULIN 12.3 02/17/2017   INSULIN 9.7 10/08/2016   INSULIN 12.7 06/25/2016   CBC    Component Value Date/Time   WBC 8.3 06/25/2016 1053   WBC 6.7 05/27/2012 1119   RBC 4.15 06/25/2016 1053   RBC 4.46 05/27/2012 1119   HGB 12.2 06/25/2016 1053   HCT 37.1  06/25/2016 1053   PLT 317 05/27/2012 1119   MCV 89 06/25/2016 1053   MCH 29.4 06/25/2016 1053   MCH 30.7 05/27/2012 1119   MCHC 32.9 06/25/2016 1053   MCHC 33.7 05/27/2012 1119   RDW 14.9 06/25/2016 1053   LYMPHSABS 2.4 06/25/2016 1053   MONOABS 0.4 05/27/2012 1119   EOSABS 0.2 06/25/2016 1053   BASOSABS 0.0 06/25/2016 1053   Iron/TIBC/Ferritin/ %Sat No results found for: IRON, TIBC, FERRITIN, IRONPCTSAT Lipid Panel     Component Value Date/Time   CHOL 196 06/11/2017 1434   TRIG 61 06/11/2017 1434   HDL 44 06/11/2017 1434   LDLCALC 140 (H) 06/11/2017 1434   Hepatic Function  Panel     Component Value Date/Time   PROT 6.4 06/11/2017 1434   ALBUMIN 4.0 06/11/2017 1434   AST 13 06/11/2017 1434   ALT 15 06/11/2017 1434   ALKPHOS 58 06/11/2017 1434   BILITOT 0.3 06/11/2017 1434      Component Value Date/Time   TSH 1.500 10/08/2016 1025   TSH 1.630 06/25/2016 1053   Results for ASYRIA, KOLANDER (MRN [id number]) as of 06/17/2017 09:31  Ref. Range 06/11/2017 14:34  Vitamin D, 25-Hydroxy Latest Ref Range: 30.0 - 100.0 ng/mL 36.0   ASSESSMENT AND PLAN: Prediabetes - Plan: metFORMIN (GLUCOPHAGE) 500 MG tablet, Comprehensive metabolic panel, Hemoglobin A1c, Insulin, random, Lipid Panel With LDL/HDL Ratio, VITAMIN D 25 Hydroxy (Vit-D Deficiency, Fractures)  Other depression - with emotional eating - Plan: buPROPion (WELLBUTRIN SR) 150 MG 12 hr tablet  At risk for diabetes mellitus  Class 2 severe obesity with serious comorbidity and body mass index (BMI) of 35.0 to 35.9 in adult, unspecified obesity type (HCC)  PLAN:  Pre-Diabetes Loucile will continue to work on weight loss, exercise, and decreasing simple carbohydrates in her diet to help decrease the risk of diabetes. We dicussed metformin including benefits and risks. She was informed that eating too many simple carbohydrates or too many calories at one sitting increases the likelihood of GI side effects. Ytzel requested  metformin for now and a prescription was written today for 1 month refill. We will check labs and [name] agreed to follow up with [name] as directed to monitor her progress.  Diabetes risk counseling Jamelia was given extended (15 minutes) diabetes prevention counseling today. She is 47 y.o. female and has risk factors for diabetes including obesity and pre-diabetes. We discussed intensive lifestyle modifications today with an emphasis on weight loss as well as increasing exercise and decreasing simple carbohydrates in her diet.  Depression with Emotional Eating Behaviors We discussed behavior modification techniques today to help Jisela deal with her emotional eating and depression. She has agreed to continue Wellbutrin SR 150 mg qd #30 with no refills and follow up as directed.  Obesity Shemekia is currently in the action stage of change. As such, her goal is to continue with weight loss efforts She has agreed to follow a lower carbohydrate, vegetable and lean protein rich diet plan Augustina has been instructed to work up to a goal of 150 minutes of combined cardio and strengthening exercise per week for weight loss and overall health benefits. We discussed the following Behavioral Modification Strategies today: increase H2O intake, increasing lean protein intake and travel eating strategies   Pola has agreed to follow up with our clinic in 3 weeks. She was informed of the importance of frequent follow up visits to maximize her success with intensive lifestyle modifications for her multiple health conditions.   OBESITY BEHAVIORAL INTERVENTION VISIT  Today's visit was # 21 out of 22.  Starting weight: 219 lbs Starting date: 06/25/16 Today's weight : 191 lbs  Today's date: 06/11/2017 Total lbs lost to date: 83 (Patients must lose 7 lbs in the first 6 months to continue with counseling)   ASK: We discussed the diagnosis of obesity with Alben Spittle today and Marlette agreed to give [name] permission  to discuss obesity behavioral modification therapy today.  ASSESS: Julliette has the diagnosis of obesity and her BMI today is 34.93 Mickelle is in the action stage of change   ADVISE: Arielys was educated on the multiple health risks of obesity as well as the benefit of weight  loss to improve her health. She was advised of the need for long term treatment and the importance of lifestyle modifications.  AGREE: Multiple dietary modification options and treatment options were discussed and  Sanaiya agreed to the above obesity treatment plan.  I, Doreene Nest, am acting as transcriptionist for Dennard Nip, MD  I have reviewed the above documentation for accuracy and completeness, and I agree with the above. -Dennard Nip, MD

## 2017-06-26 ENCOUNTER — Other Ambulatory Visit (INDEPENDENT_AMBULATORY_CARE_PROVIDER_SITE_OTHER): Payer: Self-pay | Admitting: Family Medicine

## 2017-06-26 DIAGNOSIS — R7303 Prediabetes: Secondary | ICD-10-CM

## 2017-06-26 DIAGNOSIS — F3289 Other specified depressive episodes: Secondary | ICD-10-CM

## 2017-06-30 ENCOUNTER — Other Ambulatory Visit (INDEPENDENT_AMBULATORY_CARE_PROVIDER_SITE_OTHER): Payer: Self-pay | Admitting: Family Medicine

## 2017-06-30 DIAGNOSIS — F3289 Other specified depressive episodes: Secondary | ICD-10-CM

## 2017-07-01 ENCOUNTER — Ambulatory Visit (INDEPENDENT_AMBULATORY_CARE_PROVIDER_SITE_OTHER): Payer: BC Managed Care – PPO | Admitting: Family Medicine

## 2017-07-01 VITALS — BP 116/78 | HR 71 | Temp 98.2°F | Ht 62.0 in | Wt 188.0 lb

## 2017-07-01 DIAGNOSIS — R7303 Prediabetes: Secondary | ICD-10-CM

## 2017-07-01 DIAGNOSIS — Z6834 Body mass index (BMI) 34.0-34.9, adult: Secondary | ICD-10-CM | POA: Diagnosis not present

## 2017-07-01 DIAGNOSIS — E669 Obesity, unspecified: Secondary | ICD-10-CM

## 2017-07-01 DIAGNOSIS — Z9189 Other specified personal risk factors, not elsewhere classified: Secondary | ICD-10-CM | POA: Diagnosis not present

## 2017-07-01 DIAGNOSIS — F3289 Other specified depressive episodes: Secondary | ICD-10-CM | POA: Diagnosis not present

## 2017-07-01 MED ORDER — BUPROPION HCL ER (SR) 200 MG PO TB12: 200.0000 mg | ORAL_TABLET | Freq: Every day | ORAL | 0 refills | Status: DC

## 2017-07-02 NOTE — Progress Notes (Signed)
Office: [phone]  /  Fax: [phone]   HPI:   Chief Complaint: OBESITY Grace Horton is here to discuss her progress with her obesity treatment plan. She is on the lower carbohydrate, vegetable and lean protein rich diet plan and is following her eating plan approximately 90 % of the time. She states she is walking, and some weights for 30-60 minutes 2-3 times per week. Grace Horton continues to do well with weight loss even with traveling and on vacation. Her planning ahead of time is going well.  Her weight is 188 lb (85.3 kg) today and has had a weight loss of 3 pounds over a period of 3 weeks since her last visit. She has lost 31 lbs since starting treatment with [name].  Pre-Diabetes Grace Horton has a diagnosis of pre-diabetes based on her elevated Hgb A1c and was informed this puts her at greater risk of developing diabetes. She is stable on metformin and diet, A1c and insulin greatly improved. She continues to work on exercise to decrease risk of diabetes. She denies nausea or hypoglycemia.  At risk for diabetes Grace Horton is at higher than average risk for developing diabetes due to her obesity and pre-diabetes. She currently denies polyuria or polydipsia.  Depression with emotional eating behaviors Grace Horton is doing well on Wellbutrin but feels more cravings and emotional eating. Grace Horton struggles with emotional eating and using food for comfort to the extent that it is negatively impacting her health. She often snacks when she is not hungry. Grace Horton sometimes feels she is out of control and then feels guilty that she made poor food choices. She has been working on behavior modification techniques to help reduce her emotional eating and has been somewhat successful. She shows no sign of suicidal or homicidal ideations.  Depression screen PHQ 2/9 06/25/2016  Decreased Interest 3  Down, Depressed, Hopeless 1  PHQ - 2 Score 4  Altered sleeping 0  Tired, decreased energy 3  Change in appetite 2  Feeling  bad or failure about yourself  1  Trouble concentrating 1  Moving slowly or fidgety/restless 3  Suicidal thoughts 0  PHQ-9 Score 14    ALLERGIES: No Known Allergies  MEDICATIONS: Current Outpatient Medications on File Prior to Visit  Medication Sig Dispense Refill  . Diclofenac Sodium (PENNSAID) 2 % SOLN Fingertip amount to affected area twice daily 1 Bottle 2  . levonorgestrel-ethinyl estradiol (AVIANE,ALESSE,LESSINA) 0.1-20 MG-MCG tablet Take 1 tablet by mouth daily.    Marland Kitchen MAGNESIUM MALATE PO Take 1,250 mg by mouth 2 (two) times daily.    . metFORMIN (GLUCOPHAGE) 500 MG tablet Take 1 tablet (500 mg total) by mouth 2 (two) times daily with a meal. 60 tablet 0  . polyethylene glycol powder (GLYCOLAX/MIRALAX) powder Take 17 g by mouth daily. 3350 g 0  . Vitamin D, Ergocalciferol, (DRISDOL) 50000 units CAPS capsule Take 1 capsule (50,000 Units total) by mouth every 7 (seven) days. 4 capsule 0   No current facility-administered medications on file prior to visit.     PAST MEDICAL HISTORY: Past Medical History:  Diagnosis Date  . AC (acromioclavicular) joint bone spurs   . Anxiety   . Back pain   . Chondromalacia   . Depression   . GERD (gastroesophageal reflux disease)   . History of shingles 2011   Around Left Eye  . Joint pain   . Raynaud disease   . Swelling    in feet or legs  . Vitamin D deficiency     PAST  SURGICAL HISTORY: Past Surgical History:  Procedure Laterality Date  . CESAREAN SECTION    . SHOULDER SURGERY Left 2007  . TONSILLECTOMY      SOCIAL HISTORY: Social History   Tobacco Use  . Smoking status: Never Smoker  . Smokeless tobacco: Never Used  Substance Use Topics  . Alcohol use: No  . Drug use: No    FAMILY HISTORY: Family History  Problem Relation Age of Onset  . Heart disease Father   . Diabetes Father   . Kidney disease Father        Stage III   . Transient ischemic attack Father        S/P Carotid endarterectomy  . Hypertension  Father   . Hyperlipidemia Father   . Irritable bowel syndrome Mother   . Hypertension Mother   . Hyperlipidemia Mother   . Obesity Mother     ROS: Review of Systems  Constitutional: Positive for weight loss.  Gastrointestinal: Negative for nausea.  Genitourinary: Negative for frequency.  Endo/Heme/Allergies: Negative for polydipsia.       Negative hypoglycemia  Psychiatric/Behavioral: Positive for depression. Negative for suicidal ideas.    PHYSICAL EXAM: Blood pressure 116/78, pulse 71, temperature 98.2 F (36.8 C), temperature source Oral, height 5\' 2"  (1.575 m), weight 188 lb (85.3 kg), SpO2 99 %. Body mass index is 34.39 kg/m. Physical Exam  Constitutional: She is oriented to person, place, and time. She appears well-developed and well-nourished.  Cardiovascular: Normal rate.  Pulmonary/Chest: Effort normal.  Musculoskeletal: Normal range of motion.  Neurological: She is oriented to person, place, and time.  Skin: Skin is warm and dry.  Psychiatric: She has a normal mood and affect. Her behavior is normal.  Vitals reviewed.   RECENT LABS AND TESTS: BMET    Component Value Date/Time   NA 139 06/11/2017 1434   K 4.4 06/11/2017 1434   CL 107 (H) 06/11/2017 1434   CO2 20 06/11/2017 1434   GLUCOSE 80 06/11/2017 1434   GLUCOSE 83 05/27/2012 1119   BUN 13 06/11/2017 1434   CREATININE 0.72 06/11/2017 1434   CREATININE 0.81 05/27/2012 1119   CALCIUM 8.8 06/11/2017 1434   GFRNONAA 99 06/11/2017 1434   GFRAA 114 06/11/2017 1434   Lab Results  Component Value Date   HGBA1C 5.1 06/11/2017   HGBA1C 5.5 02/17/2017   HGBA1C 5.3 10/08/2016   HGBA1C 5.7 (H) 06/25/2016   Lab Results  Component Value Date   INSULIN 6.5 06/11/2017   INSULIN 12.3 02/17/2017   INSULIN 9.7 10/08/2016   INSULIN 12.7 06/25/2016   CBC    Component Value Date/Time   WBC 8.3 06/25/2016 1053   WBC 6.7 05/27/2012 1119   RBC 4.15 06/25/2016 1053   RBC 4.46 05/27/2012 1119   HGB 12.2  06/25/2016 1053   HCT 37.1 06/25/2016 1053   PLT 317 05/27/2012 1119   MCV 89 06/25/2016 1053   MCH 29.4 06/25/2016 1053   MCH 30.7 05/27/2012 1119   MCHC 32.9 06/25/2016 1053   MCHC 33.7 05/27/2012 1119   RDW 14.9 06/25/2016 1053   LYMPHSABS 2.4 06/25/2016 1053   MONOABS 0.4 05/27/2012 1119   EOSABS 0.2 06/25/2016 1053   BASOSABS 0.0 06/25/2016 1053   Iron/TIBC/Ferritin/ %Sat No results found for: IRON, TIBC, FERRITIN, IRONPCTSAT Lipid Panel     Component Value Date/Time   CHOL 196 06/11/2017 1434   TRIG 61 06/11/2017 1434   HDL 44 06/11/2017 1434   LDLCALC 140 (H) 06/11/2017 1434  Hepatic Function Panel     Component Value Date/Time   PROT 6.4 06/11/2017 1434   ALBUMIN 4.0 06/11/2017 1434   AST 13 06/11/2017 1434   ALT 15 06/11/2017 1434   ALKPHOS 58 06/11/2017 1434   BILITOT 0.3 06/11/2017 1434      Component Value Date/Time   TSH 1.500 10/08/2016 1025   TSH 1.630 06/25/2016 1053    ASSESSMENT AND PLAN: Prediabetes  Other depression - with emotional eating - Plan: buPROPion (WELLBUTRIN SR) 200 MG 12 hr tablet  At risk for diabetes mellitus  Class 1 obesity with serious comorbidity and body mass index (BMI) of 34.0 to 34.9 in adult, unspecified obesity type  PLAN:  Pre-Diabetes Grace Horton will continue to work on weight loss, diet, exercise, and decreasing simple carbohydrates in her diet to help decrease the risk of diabetes. We dicussed metformin including benefits and risks. She was informed that eating too many simple carbohydrates or too many calories at one sitting increases the likelihood of GI side effects. Brizeyda agrees to continue taking metformin and she agrees to follow up with our clinic in 3 weeks as directed to monitor her progress.  Diabetes risk counselling Grace Horton was given extended (15 minutes) diabetes prevention counseling today. She is 47 y.o. female and has risk factors for diabetes including obesity and pre-diabetes. We discussed intensive  lifestyle modifications today with an emphasis on weight loss as well as increasing exercise and decreasing simple carbohydrates in her diet.  Depression with Emotional Eating Behaviors We discussed behavior modification techniques today to help Grace Horton deal with her emotional eating and depression. Grace Horton agrees to increase Wellbutrin SR to 200 mg q AM #30 with no refills. Grace Horton agrees to follow up with our clinic in 3 weeks.  Obesity Grace Horton is currently in the action stage of change. As such, her goal is to continue with weight loss efforts She has agreed to follow a lower carbohydrate, vegetable and lean protein rich diet plan Grace Horton has been instructed to work up to a goal of 150 minutes of combined cardio and strengthening exercise per week for weight loss and overall health benefits. We discussed the following Behavioral Modification Strategies today: increasing lean protein intake, decreasing simple carbohydrates  and work on meal planning and easy cooking plans   Grace Horton has agreed to follow up with our clinic in 3 weeks. She was informed of the importance of frequent follow up visits to maximize her success with intensive lifestyle modifications for her multiple health conditions.   OBESITY BEHAVIORAL INTERVENTION VISIT  Today's visit was # 22 out of 22.  Starting weight: 219 lbs Starting date: 06/25/17 Today's weight : 188 lbs  Today's date: 07/01/2017 Total lbs lost to date: 40 (Patients must lose 7 lbs in the first 6 months to continue with counseling)   ASK: We discussed the diagnosis of obesity with Grace Horton today and Grace Horton agreed to give [name] permission to discuss obesity behavioral modification therapy today.  ASSESS: Grace Horton has the diagnosis of obesity and her BMI today is 34.38 Grace Horton is in the action stage of change   ADVISE: Grace Horton was educated on the multiple health risks of obesity as well as the benefit of weight loss to improve her health. She was advised  of the need for long term treatment and the importance of lifestyle modifications.  AGREE: Multiple dietary modification options and treatment options were discussed and  Grace Horton agreed to the above obesity treatment plan.  Grace Horton, am acting as  transcriptionist for Dennard Nip, MD  I have reviewed the above documentation for accuracy and completeness, and I agree with the above. -Dennard Nip, MD

## 2017-07-22 ENCOUNTER — Ambulatory Visit (INDEPENDENT_AMBULATORY_CARE_PROVIDER_SITE_OTHER): Payer: BC Managed Care – PPO | Admitting: Family Medicine

## 2017-07-22 VITALS — BP 117/81 | HR 79 | Temp 98.1°F | Ht 62.0 in | Wt 186.0 lb

## 2017-07-22 DIAGNOSIS — F3289 Other specified depressive episodes: Secondary | ICD-10-CM

## 2017-07-22 DIAGNOSIS — Z6834 Body mass index (BMI) 34.0-34.9, adult: Secondary | ICD-10-CM | POA: Diagnosis not present

## 2017-07-22 DIAGNOSIS — E669 Obesity, unspecified: Secondary | ICD-10-CM | POA: Diagnosis not present

## 2017-07-22 DIAGNOSIS — E559 Vitamin D deficiency, unspecified: Secondary | ICD-10-CM | POA: Diagnosis not present

## 2017-07-22 DIAGNOSIS — R7303 Prediabetes: Secondary | ICD-10-CM

## 2017-07-22 MED ORDER — VITAMIN D (ERGOCALCIFEROL) 1.25 MG (50000 UNIT) PO CAPS: 50000.0000 [IU] | ORAL_CAPSULE | ORAL | 0 refills | Status: DC

## 2017-07-22 MED ORDER — METFORMIN HCL 500 MG PO TABS: 500.0000 mg | ORAL_TABLET | Freq: Two times a day (BID) | ORAL | 0 refills | Status: DC

## 2017-07-22 MED ORDER — BUPROPION HCL ER (SR) 200 MG PO TB12: 200.0000 mg | ORAL_TABLET | Freq: Every day | ORAL | 0 refills | Status: DC

## 2017-07-22 NOTE — Progress Notes (Signed)
Office: [phone]  /  Fax: [phone]   HPI:   Chief Complaint: OBESITY Grace Horton is here to discuss her progress with her obesity treatment plan. She is on the lower carbohydrate, vegetable and lean protein rich diet plan and is following her eating plan approximately 80 % of the time. She states she is walking for 30 to 60 minutes 2 times per week. [name] helped her daughter move to [place] and increased activity. She was walking 10,000 to 18,000 steps per day. She over indulged, but she got back on track when she got home and is doing well. Her weight is 186 lb (84.4 kg) today and has had a weight loss of 2 pounds over a period of 3 weeks since her last visit. She has lost 33 lbs since starting treatment with [name].  Vitamin D deficiency [name] has a diagnosis of vitamin D deficiency. [name] is stable on vit D, but she is not yet at goal. Clayton denies nausea, vomiting or muscle weakness.  Pre-Diabetes [name] has a diagnosis of prediabetes based on her elevated Hgb A1c and was informed this puts her at greater risk of developing diabetes. [name] is stable on metformin and she continues to work on diet and exercise to decrease risk of diabetes. She denies nausea, vomiting or hypoglycemia.  Depression with emotional eating behaviors [name] increased the dose of Wellbutrin at the last visit and feels this dose is helping her more. [name] struggles with emotional eating and using food for comfort to the extent that it is negatively impacting her health. She often snacks when she is not hungry. [name] sometimes feels she is out of control and then feels guilty that she made poor food choices. She has been working on behavior modification techniques to help reduce her emotional eating and has been somewhat successful. She shows no sign of suicidal or homicidal ideations.  Depression screen PHQ 2/9 06/25/2016  Decreased Interest 3  Down, Depressed, Hopeless 1  PHQ - 2 Score 4  Altered  sleeping 0  Tired, decreased energy 3  Change in appetite 2  Feeling bad or failure about yourself  1  Trouble concentrating 1  Moving slowly or fidgety/restless 3  Suicidal thoughts 0  PHQ-9 Score 14     ALLERGIES: No Known Allergies  MEDICATIONS: Current Outpatient Medications on File Prior to Visit  Medication Sig Dispense Refill  . Diclofenac Sodium (PENNSAID) 2 % SOLN Fingertip amount to affected area twice daily 1 Bottle 2  . levonorgestrel-ethinyl estradiol (AVIANE,ALESSE,LESSINA) 0.1-20 MG-MCG tablet Take 1 tablet by mouth daily.    Marland Kitchen MAGNESIUM MALATE PO Take 1,250 mg by mouth 2 (two) times daily.    . polyethylene glycol powder (GLYCOLAX/MIRALAX) powder Take 17 g by mouth daily. 3350 g 0   No current facility-administered medications on file prior to visit.     PAST MEDICAL HISTORY: Past Medical History:  Diagnosis Date  . AC (acromioclavicular) joint bone spurs   . Anxiety   . Back pain   . Chondromalacia   . Depression   . GERD (gastroesophageal reflux disease)   . History of shingles 2011   Around Left Eye  . Joint pain   . Raynaud disease   . Swelling    in feet or legs  . Vitamin D deficiency     PAST SURGICAL HISTORY: Past Surgical History:  Procedure Laterality Date  . CESAREAN SECTION    . SHOULDER SURGERY Left 2007  . TONSILLECTOMY      SOCIAL  HISTORY: Social History   Tobacco Use  . Smoking status: Never Smoker  . Smokeless tobacco: Never Used  Substance Use Topics  . Alcohol use: No  . Drug use: No    FAMILY HISTORY: Family History  Problem Relation Age of Onset  . Heart disease Father   . Diabetes Father   . Kidney disease Father        Stage III   . Transient ischemic attack Father        S/P Carotid endarterectomy  . Hypertension Father   . Hyperlipidemia Father   . Irritable bowel syndrome Mother   . Hypertension Mother   . Hyperlipidemia Mother   . Obesity Mother     ROS: Review of Systems  Constitutional:  Positive for weight loss.  Gastrointestinal: Negative for nausea and vomiting.  Musculoskeletal:       Negative for muscle weakness  Endo/Heme/Allergies:       Negative for hypoglycemia  Psychiatric/Behavioral: Positive for depression. Negative for suicidal ideas.    PHYSICAL EXAM: Blood pressure 117/81, pulse 79, temperature 98.1 F (36.7 C), temperature source Oral, height 5\' 2"  (1.575 m), weight 186 lb (84.4 kg), SpO2 100 %. Body mass index is 34.02 kg/m. Physical Exam  Constitutional: She is oriented to person, place, and time. She appears well-developed and well-nourished.  Cardiovascular: Normal rate.  Pulmonary/Chest: Effort normal.  Musculoskeletal: Normal range of motion.  Neurological: She is oriented to person, place, and time.  Skin: Skin is warm and dry.  Psychiatric: She has a normal mood and affect. Her behavior is normal.  Vitals reviewed.   RECENT LABS AND TESTS: BMET    Component Value Date/Time   NA 139 06/11/2017 1434   K 4.4 06/11/2017 1434   CL 107 (H) 06/11/2017 1434   CO2 20 06/11/2017 1434   GLUCOSE 80 06/11/2017 1434   GLUCOSE 83 05/27/2012 1119   BUN 13 06/11/2017 1434   CREATININE 0.72 06/11/2017 1434   CREATININE 0.81 05/27/2012 1119   CALCIUM 8.8 06/11/2017 1434   GFRNONAA 99 06/11/2017 1434   GFRAA 114 06/11/2017 1434   Lab Results  Component Value Date   HGBA1C 5.1 06/11/2017   HGBA1C 5.5 02/17/2017   HGBA1C 5.3 10/08/2016   HGBA1C 5.7 (H) 06/25/2016   Lab Results  Component Value Date   INSULIN 6.5 06/11/2017   INSULIN 12.3 02/17/2017   INSULIN 9.7 10/08/2016   INSULIN 12.7 06/25/2016   CBC    Component Value Date/Time   WBC 8.3 06/25/2016 1053   WBC 6.7 05/27/2012 1119   RBC 4.15 06/25/2016 1053   RBC 4.46 05/27/2012 1119   HGB 12.2 06/25/2016 1053   HCT 37.1 06/25/2016 1053   PLT 317 05/27/2012 1119   MCV 89 06/25/2016 1053   MCH 29.4 06/25/2016 1053   MCH 30.7 05/27/2012 1119   MCHC 32.9 06/25/2016 1053   MCHC  33.7 05/27/2012 1119   RDW 14.9 06/25/2016 1053   LYMPHSABS 2.4 06/25/2016 1053   MONOABS 0.4 05/27/2012 1119   EOSABS 0.2 06/25/2016 1053   BASOSABS 0.0 06/25/2016 1053   Iron/TIBC/Ferritin/ %Sat No results found for: IRON, TIBC, FERRITIN, IRONPCTSAT Lipid Panel     Component Value Date/Time   CHOL 196 06/11/2017 1434   TRIG 61 06/11/2017 1434   HDL 44 06/11/2017 1434   LDLCALC 140 (H) 06/11/2017 1434   Hepatic Function Panel     Component Value Date/Time   PROT 6.4 06/11/2017 1434   ALBUMIN 4.0 06/11/2017 1434  AST 13 06/11/2017 1434   ALT 15 06/11/2017 1434   ALKPHOS 58 06/11/2017 1434   BILITOT 0.3 06/11/2017 1434      Component Value Date/Time   TSH 1.500 10/08/2016 1025   TSH 1.630 06/25/2016 1053   Results for CALLA, WEDEKIND (MRN [id number]) as of 07/22/2017 15:24  Ref. Range 06/11/2017 14:34  Vitamin D, 25-Hydroxy Latest Ref Range: 30.0 - 100.0 ng/mL 36.0   ASSESSMENT AND PLAN: Prediabetes - Plan: metFORMIN (GLUCOPHAGE) 500 MG tablet  Vitamin D deficiency - Plan: Vitamin D, Ergocalciferol, (DRISDOL) 50000 units CAPS capsule  Other depression - with emotional eating - Plan: buPROPion (WELLBUTRIN SR) 200 MG 12 hr tablet  Class 1 obesity with serious comorbidity and body mass index (BMI) of 34.0 to 34.9 in adult, unspecified obesity type  PLAN:  Vitamin D Deficiency Chenee was informed that low vitamin D levels contributes to fatigue and are associated with obesity, breast, and colon cancer. She agrees to continue to take prescription Vit D @50 ,000 IU every week #4 with no refills and will follow up for routine testing of vitamin D, at least 2-3 times per year. She was informed of the risk of over-replacement of vitamin D and agrees to not increase her dose unless she discusses this with [name] first. Darlisa agrees to follow up as directed.  Pre-Diabetes Glorianna will continue to work on weight loss, exercise, and decreasing simple carbohydrates in her diet to help  decrease the risk of diabetes. We dicussed metformin including benefits and risks. She was informed that eating too many simple carbohydrates or too many calories at one sitting increases the likelihood of GI side effects. Krisna requested metformin for now and a prescription was written today for 1 month refill. Crystalynn agreed to follow up with [name] as directed to monitor her progress.  Depression with Emotional Eating Behaviors We discussed behavior modification techniques today to help Cia deal with her emotional eating and depression. She has agreed to take Wellbutrin SR 200 mg qd #30 with no refills and follow up as directed.  Obesity Brittanyann is currently in the action stage of change. As such, her goal is to continue with weight loss efforts She has agreed to follow a lower carbohydrate, vegetable and lean protein rich diet plan [facility] has been instructed to work up to a goal of 150 minutes of combined cardio and strengthening exercise per week for weight loss and overall health benefits. We discussed the following Behavioral Modification Strategies today: increasing lean protein intake and decreasing simple carbohydrates   Ovetta has agreed to follow up with our clinic in 3 weeks. She was informed of the importance of frequent follow up visits to maximize her success with intensive lifestyle modifications for her multiple health conditions.   I, Doreene Nest, am acting as transcriptionist for Dennard Nip, MD  I have reviewed the above documentation for accuracy and completeness, and I agree with the above. -Dennard Nip, MD

## 2017-08-12 ENCOUNTER — Ambulatory Visit (INDEPENDENT_AMBULATORY_CARE_PROVIDER_SITE_OTHER): Payer: BC Managed Care – PPO | Admitting: Family Medicine

## 2017-08-12 VITALS — BP 112/77 | HR 77 | Temp 97.8°F | Ht 62.0 in | Wt 187.0 lb

## 2017-08-12 DIAGNOSIS — R7303 Prediabetes: Secondary | ICD-10-CM

## 2017-08-12 DIAGNOSIS — E669 Obesity, unspecified: Secondary | ICD-10-CM | POA: Diagnosis not present

## 2017-08-12 DIAGNOSIS — Z6834 Body mass index (BMI) 34.0-34.9, adult: Secondary | ICD-10-CM | POA: Diagnosis not present

## 2017-08-12 DIAGNOSIS — F3289 Other specified depressive episodes: Secondary | ICD-10-CM

## 2017-08-12 DIAGNOSIS — Z9189 Other specified personal risk factors, not elsewhere classified: Secondary | ICD-10-CM

## 2017-08-14 ENCOUNTER — Other Ambulatory Visit (INDEPENDENT_AMBULATORY_CARE_PROVIDER_SITE_OTHER): Payer: Self-pay | Admitting: Family Medicine

## 2017-08-14 DIAGNOSIS — E559 Vitamin D deficiency, unspecified: Secondary | ICD-10-CM

## 2017-08-17 NOTE — Progress Notes (Signed)
Office: [phone]  /  Fax: [phone]   HPI:   Chief Complaint: OBESITY Grace Horton is here to discuss her progress with her obesity treatment plan. She is on the lower carbohydrate, vegetable and lean protein rich diet plan and is following her eating plan approximately 70 % of the time. She states she is walking 45 minutes 2 to 4 times per week. Mickie struggled with the low carb plan over the last few weeks. She reports an increase in stress eating and uncomfortable cravings. Her weight is 187 lb (84.8 kg) today and has had a weight gain of 1 pound over a period of 3 weeks since her last visit. She has lost 32 lbs since starting treatment with [name].  Pre-Diabetes Grace Horton has a diagnosis of prediabetes based on her elevated Hgb A1c and was informed this puts her at greater risk of developing diabetes. She is on metformin currently and continues to work on diet and exercise to decrease risk of diabetes. She denies nausea, vomiting, diarrhea or hypoglycemia.  At risk for diabetes Grace Horton is at higher than average risk for developing diabetes due to her obesity and pre-diabetes. She currently denies polyuria or polydipsia.  Depression with emotional eating behaviors Grace Horton is currently taking Wellbutrin and dose was increased 4 weeks ago. Grace Horton continues to report emotional eating despite an increase in dose. She struggles with emotional eating and using food for comfort to the extent that it is negatively impacting her health. She often snacks when she is not hungry. Grace Horton sometimes feels she is out of control and then feels guilty that she made poor food choices. She has been working on behavior modification techniques to help reduce her emotional eating and has been somewhat successful. She shows no sign of suicidal or homicidal ideations.  Depression screen PHQ 2/9 06/25/2016  Decreased Interest 3  Down, Depressed, Hopeless 1  PHQ - 2 Score 4  Altered sleeping 0  Tired, decreased energy 3   Change in appetite 2  Feeling bad or failure about yourself  1  Trouble concentrating 1  Moving slowly or fidgety/restless 3  Suicidal thoughts 0  PHQ-9 Score 14     ALLERGIES: No Known Allergies  MEDICATIONS: Current Outpatient Medications on File Prior to Visit  Medication Sig Dispense Refill  . Diclofenac Sodium (PENNSAID) 2 % SOLN Fingertip amount to affected area twice daily 1 Bottle 2  . levonorgestrel-ethinyl estradiol (AVIANE,ALESSE,LESSINA) 0.1-20 MG-MCG tablet Take 1 tablet by mouth daily.    Marland Kitchen MAGNESIUM MALATE PO Take 1,250 mg by mouth 2 (two) times daily.    . metFORMIN (GLUCOPHAGE) 500 MG tablet Take 1 tablet (500 mg total) by mouth 2 (two) times daily with a meal. 60 tablet 0  . polyethylene glycol powder (GLYCOLAX/MIRALAX) powder Take 17 g by mouth daily. 3350 g 0  . Vitamin D, Ergocalciferol, (DRISDOL) 50000 units CAPS capsule Take 1 capsule (50,000 Units total) by mouth every 7 (seven) days. 4 capsule 0   No current facility-administered medications on file prior to visit.     PAST MEDICAL HISTORY: Past Medical History:  Diagnosis Date  . AC (acromioclavicular) joint bone spurs   . Anxiety   . Back pain   . Chondromalacia   . Depression   . GERD (gastroesophageal reflux disease)   . History of shingles 2011   Around Left Eye  . Joint pain   . Raynaud disease   . Swelling    in feet or legs  . Vitamin D deficiency  PAST SURGICAL HISTORY: Past Surgical History:  Procedure Laterality Date  . CESAREAN SECTION    . SHOULDER SURGERY Left 2007  . TONSILLECTOMY      SOCIAL HISTORY: Social History   Tobacco Use  . Smoking status: Never Smoker  . Smokeless tobacco: Never Used  Substance Use Topics  . Alcohol use: No  . Drug use: No    FAMILY HISTORY: Family History  Problem Relation Age of Onset  . Heart disease Father   . Diabetes Father   . Kidney disease Father        Stage III   . Transient ischemic attack Father        S/P Carotid  endarterectomy  . Hypertension Father   . Hyperlipidemia Father   . Irritable bowel syndrome Mother   . Hypertension Mother   . Hyperlipidemia Mother   . Obesity Mother     ROS: Review of Systems  Constitutional: Negative for weight loss.  Gastrointestinal: Negative for diarrhea, nausea and vomiting.  Genitourinary: Negative for frequency.  Endo/Heme/Allergies: Negative for polydipsia.       Negative for hypoglycemia  Psychiatric/Behavioral: Positive for depression. Negative for suicidal ideas.    PHYSICAL EXAM: Blood pressure 112/77, pulse 77, temperature 97.8 F (36.6 C), temperature source Oral, height 5\' 2"  (1.575 m), weight 187 lb (84.8 kg), SpO2 99 %. Body mass index is 34.2 kg/m. Physical Exam  Constitutional: She is oriented to person, place, and time. She appears well-developed and well-nourished.  Cardiovascular: Normal rate.  Pulmonary/Chest: Effort normal.  Musculoskeletal: Normal range of motion.  Neurological: She is oriented to person, place, and time.  Skin: Skin is warm and dry.  Psychiatric: She has a normal mood and affect. Her behavior is normal.  Vitals reviewed.   RECENT LABS AND TESTS: BMET    Component Value Date/Time   NA 139 06/11/2017 1434   K 4.4 06/11/2017 1434   CL 107 (H) 06/11/2017 1434   CO2 20 06/11/2017 1434   GLUCOSE 80 06/11/2017 1434   GLUCOSE 83 05/27/2012 1119   BUN 13 06/11/2017 1434   CREATININE 0.72 06/11/2017 1434   CREATININE 0.81 05/27/2012 1119   CALCIUM 8.8 06/11/2017 1434   GFRNONAA 99 06/11/2017 1434   GFRAA 114 06/11/2017 1434   Lab Results  Component Value Date   HGBA1C 5.1 06/11/2017   HGBA1C 5.5 02/17/2017   HGBA1C 5.3 10/08/2016   HGBA1C 5.7 (H) 06/25/2016   Lab Results  Component Value Date   INSULIN 6.5 06/11/2017   INSULIN 12.3 02/17/2017   INSULIN 9.7 10/08/2016   INSULIN 12.7 06/25/2016   CBC    Component Value Date/Time   WBC 8.3 06/25/2016 1053   WBC 6.7 05/27/2012 1119   RBC 4.15  06/25/2016 1053   RBC 4.46 05/27/2012 1119   HGB 12.2 06/25/2016 1053   HCT 37.1 06/25/2016 1053   PLT 317 05/27/2012 1119   MCV 89 06/25/2016 1053   MCH 29.4 06/25/2016 1053   MCH 30.7 05/27/2012 1119   MCHC 32.9 06/25/2016 1053   MCHC 33.7 05/27/2012 1119   RDW 14.9 06/25/2016 1053   LYMPHSABS 2.4 06/25/2016 1053   MONOABS 0.4 05/27/2012 1119   EOSABS 0.2 06/25/2016 1053   BASOSABS 0.0 06/25/2016 1053   Iron/TIBC/Ferritin/ %Sat No results found for: IRON, TIBC, FERRITIN, IRONPCTSAT Lipid Panel     Component Value Date/Time   CHOL 196 06/11/2017 1434   TRIG 61 06/11/2017 1434   HDL 44 06/11/2017 1434   LDLCALC 140 (  H) 06/11/2017 1434   Hepatic Function Panel     Component Value Date/Time   PROT 6.4 06/11/2017 1434   ALBUMIN 4.0 06/11/2017 1434   AST 13 06/11/2017 1434   ALT 15 06/11/2017 1434   ALKPHOS 58 06/11/2017 1434   BILITOT 0.3 06/11/2017 1434      Component Value Date/Time   TSH 1.500 10/08/2016 1025   TSH 1.630 06/25/2016 1053   Results for NASIYA, PASCUAL (MRN [id number]) as of 08/17/2017 09:11  Ref. Range 06/11/2017 14:34  Vitamin D, 25-Hydroxy Latest Ref Range: 30.0 - 100.0 ng/mL 36.0   ASSESSMENT AND PLAN: Other depression - with emotional eating  Prediabetes  At risk for diabetes mellitus  Class 1 obesity with serious comorbidity and body mass index (BMI) of 34.0 to 34.9 in adult, unspecified obesity type  PLAN:  Pre-Diabetes Grace Horton will continue to work on weight loss, exercise, and decreasing simple carbohydrates in her diet to help decrease the risk of diabetes. We dicussed metformin including benefits and risks. She was informed that eating too many simple carbohydrates or too many calories at one sitting increases the likelihood of GI side effects. Grace Horton agreed to continue  metformin for now and a prescription was not written today. Grace Horton agreed to follow up with [name] as directed to monitor her progress.  Diabetes risk counseling Grace Horton was  given extended (15 minutes) diabetes prevention counseling today. She is 47 y.o. female and has risk factors for diabetes including obesity and pre-diabetes. We discussed intensive lifestyle modifications today with an emphasis on weight loss as well as increasing exercise and decreasing simple carbohydrates in her diet.  Depression with Emotional Eating Behaviors We discussed behavior modification techniques today to help Grace Horton deal with her emotional eating and depression. She has agreed to discontinue Wellbutrin SR 200 mg qd and follow up as directed.  Obesity Grace Horton is currently in the action stage of change. As such, her goal is to continue with weight loss efforts She has agreed to change to keeping a food journal with 1000 to 1200 calories and 70+ grams of protein daily Grace Horton has been instructed to work up to a goal of 150 minutes of combined cardio and strengthening exercise per week for weight loss and overall health benefits. We discussed the following Behavioral Modification Strategies today: better snacking choices, decreasing simple carbohydrates  and work on meal planning and easy cooking plans We discussed various medication options to help Austine with her weight loss efforts and we both agreed to start Contrave 2 pills BID #120 with no refills (patient to start 1 pill in the morning until the next visit).  Kandas has agreed to follow up with our clinic in 3 weeks. She was informed of the importance of frequent follow up visits to maximize her success with intensive lifestyle modifications for her multiple health conditions.  I, Doreene Nest, am acting as transcriptionist for Dennard Nip, MD  I have reviewed the above documentation for accuracy and completeness, and I agree with the above. -Dennard Nip, MD

## 2017-08-24 ENCOUNTER — Encounter (INDEPENDENT_AMBULATORY_CARE_PROVIDER_SITE_OTHER): Payer: Self-pay | Admitting: Family Medicine

## 2017-09-02 ENCOUNTER — Ambulatory Visit (INDEPENDENT_AMBULATORY_CARE_PROVIDER_SITE_OTHER): Payer: BC Managed Care – PPO | Admitting: Family Medicine

## 2017-09-02 VITALS — BP 136/86 | HR 76 | Temp 98.2°F | Ht 62.0 in | Wt 188.0 lb

## 2017-09-02 DIAGNOSIS — R7303 Prediabetes: Secondary | ICD-10-CM

## 2017-09-02 DIAGNOSIS — F3289 Other specified depressive episodes: Secondary | ICD-10-CM | POA: Diagnosis not present

## 2017-09-02 DIAGNOSIS — E559 Vitamin D deficiency, unspecified: Secondary | ICD-10-CM

## 2017-09-02 DIAGNOSIS — Z9189 Other specified personal risk factors, not elsewhere classified: Secondary | ICD-10-CM

## 2017-09-02 DIAGNOSIS — Z6834 Body mass index (BMI) 34.0-34.9, adult: Secondary | ICD-10-CM

## 2017-09-02 DIAGNOSIS — E66811 Obesity, class 1: Secondary | ICD-10-CM

## 2017-09-02 DIAGNOSIS — E669 Obesity, unspecified: Secondary | ICD-10-CM

## 2017-09-02 MED ORDER — METFORMIN HCL 500 MG PO TABS: 500.0000 mg | ORAL_TABLET | Freq: Two times a day (BID) | ORAL | 0 refills | Status: DC

## 2017-09-02 MED ORDER — VITAMIN D (ERGOCALCIFEROL) 1.25 MG (50000 UNIT) PO CAPS: 50000.0000 [IU] | ORAL_CAPSULE | ORAL | 0 refills | Status: DC

## 2017-09-02 MED ORDER — LORCASERIN HCL 10 MG PO TABS: 1.0000 | ORAL_TABLET | Freq: Two times a day (BID) | ORAL | 0 refills | Status: DC

## 2017-09-02 NOTE — Progress Notes (Signed)
Office: [phone]  /  Fax: [phone]   HPI:   Chief Complaint: OBESITY Grace Horton is here to discuss her progress with her obesity treatment plan. She is on the keep a food journal with 1000-1200 calories and 70+ grams of protein daily and is following her eating plan approximately 60 % of the time. She states she is walking for 45-60 minutes 2-3 times per week. Grace Horton was on prescription Contrave at her last visit but insurance denied it due to requiring a step edition.  Her weight is 188 lb (85.3 kg) today and has gained 1 pound since her last visit. She has lost 31 lbs since starting treatment with [name].  Pre-Diabetes Grace Horton has a diagnosis of pre-diabetes based on her elevated Hgb A1c and was informed this puts her at greater risk of developing diabetes. She is stable on metformin and denies nausea, vomiting, or hypoglycemia, trying to improve diet and exercise to decrease risk of diabetes.  At risk for diabetes Grace Horton is at higher than average risk for developing diabetes due to her obesity and pre-diabetes. She currently denies polyuria or polydipsia.  Vitamin D Deficiency Grace Horton has a diagnosis of vitamin D deficiency. She is stable on prescription Vit D, not yet at goal. She denies nausea, vomiting or muscle weakness.  Depression with emotional eating behaviors Grace Horton is still struggling with emotional eating, feels Grace Horton isn't helping and using food for comfort to the extent that it is negatively impacting her health. She often snacks when she is not hungry. Grace Horton sometimes feels she is out of control and then feels guilty that she made poor food choices. She has been working on behavior modification techniques to help reduce her emotional eating and has been somewhat successful. She shows no sign of suicidal or homicidal ideations.  Depression screen PHQ 2/9 06/25/2016  Decreased Interest 3  Down, Depressed, Hopeless 1  PHQ - 2 Score 4  Altered sleeping 0  Tired,  decreased energy 3  Change in appetite 2  Feeling bad or failure about yourself  1  Trouble concentrating 1  Moving slowly or fidgety/restless 3  Suicidal thoughts 0  PHQ-9 Score 14    ALLERGIES: No Known Allergies  MEDICATIONS: Current Outpatient Medications on File Prior to Visit  Medication Sig Dispense Refill  . Diclofenac Sodium (PENNSAID) 2 % SOLN Fingertip amount to affected area twice daily 1 Bottle 2  . levonorgestrel-ethinyl estradiol (AVIANE,ALESSE,LESSINA) 0.1-20 MG-MCG tablet Take 1 tablet by mouth daily.    Marland Kitchen MAGNESIUM MALATE PO Take 1,250 mg by mouth 2 (two) times daily.    . polyethylene glycol powder (GLYCOLAX/MIRALAX) powder Take 17 g by mouth daily. 3350 g 0   No current facility-administered medications on file prior to visit.     PAST MEDICAL HISTORY: Past Medical History:  Diagnosis Date  . AC (acromioclavicular) joint bone spurs   . Anxiety   . Back pain   . Chondromalacia   . Depression   . GERD (gastroesophageal reflux disease)   . History of shingles 2011   Around Left Eye  . Joint pain   . Raynaud disease   . Swelling    in feet or legs  . Vitamin D deficiency     PAST SURGICAL HISTORY: Past Surgical History:  Procedure Laterality Date  . CESAREAN SECTION    . SHOULDER SURGERY Left 2007  . TONSILLECTOMY      SOCIAL HISTORY: Social History   Tobacco Use  . Smoking status: Never Smoker  .  Smokeless tobacco: Never Used  Substance Use Topics  . Alcohol use: No  . Drug use: No    FAMILY HISTORY: Family History  Problem Relation Age of Onset  . Heart disease Father   . Diabetes Father   . Kidney disease Father        Stage III   . Transient ischemic attack Father        S/P Carotid endarterectomy  . Hypertension Father   . Hyperlipidemia Father   . Irritable bowel syndrome Mother   . Hypertension Mother   . Hyperlipidemia Mother   . Obesity Mother     ROS: Review of Systems  Constitutional: Negative for weight loss.    Gastrointestinal: Negative for nausea and vomiting.  Genitourinary: Negative for frequency.  Musculoskeletal:       Negative muscle weakness  Endo/Heme/Allergies: Negative for polydipsia.       Negative hypoglycemia  Psychiatric/Behavioral: Positive for depression. Negative for suicidal ideas.    PHYSICAL EXAM: Blood pressure 136/86, pulse 76, temperature 98.2 F (36.8 C), temperature source Oral, height 5\' 2"  (1.575 m), weight 188 lb (85.3 kg), SpO2 98 %. Body mass index is 34.39 kg/m. Physical Exam  Constitutional: She is oriented to person, place, and time. She appears well-developed and well-nourished.  Cardiovascular: Normal rate.  Pulmonary/Chest: Effort normal.  Musculoskeletal: Normal range of motion.  Neurological: She is oriented to person, place, and time.  Skin: Skin is warm and dry.  Psychiatric: She has a normal mood and affect. Her behavior is normal.  Vitals reviewed.   RECENT LABS AND TESTS: BMET    Component Value Date/Time   NA 139 06/11/2017 1434   K 4.4 06/11/2017 1434   CL 107 (H) 06/11/2017 1434   CO2 20 06/11/2017 1434   GLUCOSE 80 06/11/2017 1434   GLUCOSE 83 05/27/2012 1119   BUN 13 06/11/2017 1434   CREATININE 0.72 06/11/2017 1434   CREATININE 0.81 05/27/2012 1119   CALCIUM 8.8 06/11/2017 1434   GFRNONAA 99 06/11/2017 1434   GFRAA 114 06/11/2017 1434   Lab Results  Component Value Date   HGBA1C 5.1 06/11/2017   HGBA1C 5.5 02/17/2017   HGBA1C 5.3 10/08/2016   HGBA1C 5.7 (H) 06/25/2016   Lab Results  Component Value Date   INSULIN 6.5 06/11/2017   INSULIN 12.3 02/17/2017   INSULIN 9.7 10/08/2016   INSULIN 12.7 06/25/2016   CBC    Component Value Date/Time   WBC 8.3 06/25/2016 1053   WBC 6.7 05/27/2012 1119   RBC 4.15 06/25/2016 1053   RBC 4.46 05/27/2012 1119   HGB 12.2 06/25/2016 1053   HCT 37.1 06/25/2016 1053   PLT 317 05/27/2012 1119   MCV 89 06/25/2016 1053   MCH 29.4 06/25/2016 1053   MCH 30.7 05/27/2012 1119   MCHC  32.9 06/25/2016 1053   MCHC 33.7 05/27/2012 1119   RDW 14.9 06/25/2016 1053   LYMPHSABS 2.4 06/25/2016 1053   MONOABS 0.4 05/27/2012 1119   EOSABS 0.2 06/25/2016 1053   BASOSABS 0.0 06/25/2016 1053   Iron/TIBC/Ferritin/ %Sat No results found for: IRON, TIBC, FERRITIN, IRONPCTSAT Lipid Panel     Component Value Date/Time   CHOL 196 06/11/2017 1434   TRIG 61 06/11/2017 1434   HDL 44 06/11/2017 1434   LDLCALC 140 (H) 06/11/2017 1434   Hepatic Function Panel     Component Value Date/Time   PROT 6.4 06/11/2017 1434   ALBUMIN 4.0 06/11/2017 1434   AST 13 06/11/2017 1434   ALT  15 06/11/2017 1434   ALKPHOS 58 06/11/2017 1434   BILITOT 0.3 06/11/2017 1434      Component Value Date/Time   TSH 1.500 10/08/2016 1025   TSH 1.630 06/25/2016 1053  Results for LAYALI, FREUND (MRN [id number]) as of 09/02/2017 14:13  Ref. Range 06/11/2017 14:34  Vitamin D, 25-Hydroxy Latest Ref Range: 30.0 - 100.0 ng/mL 36.0    ASSESSMENT AND PLAN: Prediabetes - Plan: metFORMIN (GLUCOPHAGE) 500 MG tablet  Vitamin D deficiency - Plan: Vitamin D, Ergocalciferol, (DRISDOL) 50000 units CAPS capsule  Other depression - with emotional eating  At risk for diabetes mellitus  Class 1 obesity with serious comorbidity and body mass index (BMI) of 34.0 to 34.9 in adult, unspecified obesity type - Plan: Lorcaserin HCl (BELVIQ) 10 MG TABS  PLAN:  Pre-Diabetes Grace Horton will continue to work on weight loss, exercise, and decreasing simple carbohydrates in her diet to help decrease the risk of diabetes. We dicussed metformin including benefits and risks. She was informed that eating too many simple carbohydrates or too many calories at one sitting increases the likelihood of GI side effects. Grace Horton agrees to continue taking metformin 500 mg BID #60 and we will refill for 1 month. Grace Horton agrees to follow up with our clinic in 3 weeks as directed to monitor her progress.  Diabetes risk counselling Grace Horton was given  extended (15 minutes) diabetes prevention counseling today. She is 47 y.o. female and has risk factors for diabetes including obesity and pre-diabetes. We discussed intensive lifestyle modifications today with an emphasis on weight loss as well as increasing exercise and decreasing simple carbohydrates in her diet.  Vitamin D Deficiency Grace Horton was informed that low vitamin D levels contributes to fatigue and are associated with obesity, breast, and colon cancer. Grace Horton agrees to continue taking prescription Vit D @50 ,000 IU every week #4 and we will refill for 1 month. She will follow up for routine testing of vitamin D, at least 2-3 times per year. She was informed of the risk of over-replacement of vitamin D and agrees to not increase her dose unless she discusses this with [name] first. Grace Horton agrees to follow up with our clinic in 3 weeks.   Depression with Emotional Eating Behaviors We discussed behavior modification techniques today to help Grace Horton deal with her emotional eating and depression. Grace Horton agrees to discontinue Grace Horton, will start Belviq to help decrease emotional eating. Grace Horton agrees to follow up with our clinic in 3 weeks.  Obesity Grace Horton is currently in the action stage of change. As such, her goal is to continue with weight loss efforts She has agreed to keep a food journal with 1000-1300 calories and 70+ grams of protein daily Grace Horton has been instructed to work up to a goal of 150 minutes of combined cardio and strengthening exercise per week for weight loss and overall health benefits. We discussed the following Behavioral Modification Strategies today: increasing lean protein intake and decreasing simple carbohydrates  We discussed various medication options to help Grace Horton with her weight loss efforts and we both agreed to start Belviq 10 mg BID #60 with no refill, (no print).  Grace Horton has agreed to follow up with our clinic in 3 weeks. She was informed of the importance of  frequent follow up visits to maximize her success with intensive lifestyle modifications for her multiple health conditions.   I, Grace Horton, am acting as transcriptionist for Grace Nip, MD  I have reviewed the above documentation for accuracy and completeness, and I agree with  the above. -Grace Nip, MD

## 2017-09-03 ENCOUNTER — Encounter (INDEPENDENT_AMBULATORY_CARE_PROVIDER_SITE_OTHER): Payer: Self-pay

## 2017-09-07 ENCOUNTER — Other Ambulatory Visit (INDEPENDENT_AMBULATORY_CARE_PROVIDER_SITE_OTHER): Payer: Self-pay | Admitting: Family Medicine

## 2017-09-07 DIAGNOSIS — F3289 Other specified depressive episodes: Secondary | ICD-10-CM

## 2017-09-11 ENCOUNTER — Encounter (INDEPENDENT_AMBULATORY_CARE_PROVIDER_SITE_OTHER): Payer: Self-pay | Admitting: Family Medicine

## 2017-09-25 ENCOUNTER — Other Ambulatory Visit (INDEPENDENT_AMBULATORY_CARE_PROVIDER_SITE_OTHER): Payer: Self-pay | Admitting: Family Medicine

## 2017-09-25 DIAGNOSIS — E559 Vitamin D deficiency, unspecified: Secondary | ICD-10-CM

## 2017-09-27 ENCOUNTER — Other Ambulatory Visit (INDEPENDENT_AMBULATORY_CARE_PROVIDER_SITE_OTHER): Payer: Self-pay | Admitting: Family Medicine

## 2017-09-27 DIAGNOSIS — R7303 Prediabetes: Secondary | ICD-10-CM

## 2017-09-28 ENCOUNTER — Ambulatory Visit (INDEPENDENT_AMBULATORY_CARE_PROVIDER_SITE_OTHER): Payer: BC Managed Care – PPO | Admitting: Family Medicine

## 2017-09-28 VITALS — BP 102/62 | HR 70 | Temp 98.2°F | Ht 62.0 in | Wt 191.0 lb

## 2017-09-28 DIAGNOSIS — Z6835 Body mass index (BMI) 35.0-35.9, adult: Secondary | ICD-10-CM | POA: Diagnosis not present

## 2017-09-28 DIAGNOSIS — J3089 Other allergic rhinitis: Secondary | ICD-10-CM | POA: Diagnosis not present

## 2017-09-28 DIAGNOSIS — Z9189 Other specified personal risk factors, not elsewhere classified: Secondary | ICD-10-CM

## 2017-09-28 MED ORDER — FLUTICASONE PROPIONATE 50 MCG/ACT NA SUSP: 2.0000 | Freq: Every day | NASAL | 0 refills | Status: DC

## 2017-09-28 MED ORDER — LORCASERIN HCL 10 MG PO TABS: 1.0000 | ORAL_TABLET | Freq: Two times a day (BID) | ORAL | 0 refills | Status: DC

## 2017-09-28 NOTE — Progress Notes (Signed)
Office: [phone]  /  Fax: [phone]   HPI:   Chief Complaint: OBESITY Grace Horton is here to discuss her progress with her obesity treatment plan. She is on the keep a food journal with 1000-1300 calories and 70+ grams of protein daily and is following her eating plan approximately 95 % of the time. She states she is exercising 0 minutes 0 times per week. Grace Horton was prescribed Contrave but insurance required a strip edit of Belviq first. She started Belviq 3 weeks ago and found it difficult to eat all of her calories and is likely decreasing her RMR. She is retaining fluid today.  Her weight is 191 lb (86.6 kg) today and has gained 3 pounds since her last visit. She has lost 28 lbs since starting treatment with [name].  Allergic Rhinitis Grace Horton notes worsening allergic rhinitis and took Zyrtec but it made her very sleepy for 3 days.  At risk for diabetes Grace Horton is at higher than average risk for developing diabetes due to her obesity. She currently denies polyuria or polydipsia.  ALLERGIES: No Known Allergies  MEDICATIONS: Current Outpatient Medications on File Prior to Visit  Medication Sig Dispense Refill  . Diclofenac Sodium (PENNSAID) 2 % SOLN Fingertip amount to affected area twice daily 1 Bottle 2  . levonorgestrel-ethinyl estradiol (AVIANE,ALESSE,LESSINA) 0.1-20 MG-MCG tablet Take 1 tablet by mouth daily.    . Lorcaserin HCl (BELVIQ) 10 MG TABS Take 1 tablet by mouth 2 (two) times daily. 60 tablet 0  . MAGNESIUM MALATE PO Take 1,250 mg by mouth 2 (two) times daily.    . metFORMIN (GLUCOPHAGE) 500 MG tablet Take 1 tablet (500 mg total) by mouth 2 (two) times daily with a meal. 60 tablet 0  . polyethylene glycol powder (GLYCOLAX/MIRALAX) powder Take 17 g by mouth daily. 3350 g 0  . Vitamin D, Ergocalciferol, (DRISDOL) 50000 units CAPS capsule Take 1 capsule (50,000 Units total) by mouth every 7 (seven) days. 4 capsule 0   No current facility-administered medications on file prior  to visit.     PAST MEDICAL HISTORY: Past Medical History:  Diagnosis Date  . AC (acromioclavicular) joint bone spurs   . Anxiety   . Back pain   . Chondromalacia   . Depression   . GERD (gastroesophageal reflux disease)   . History of shingles 2011   Around Left Eye  . Joint pain   . Raynaud disease   . Swelling    in feet or legs  . Vitamin D deficiency     PAST SURGICAL HISTORY: Past Surgical History:  Procedure Laterality Date  . CESAREAN SECTION    . SHOULDER SURGERY Left 2007  . TONSILLECTOMY      SOCIAL HISTORY: Social History   Tobacco Use  . Smoking status: Never Smoker  . Smokeless tobacco: Never Used  Substance Use Topics  . Alcohol use: No  . Drug use: No    FAMILY HISTORY: Family History  Problem Relation Age of Onset  . Heart disease Father   . Diabetes Father   . Kidney disease Father        Stage III   . Transient ischemic attack Father        S/P Carotid endarterectomy  . Hypertension Father   . Hyperlipidemia Father   . Irritable bowel syndrome Mother   . Hypertension Mother   . Hyperlipidemia Mother   . Obesity Mother     ROS: Review of Systems  Constitutional: Negative for weight loss.  Genitourinary:  Negative for frequency.  Endo/Heme/Allergies: Negative for polydipsia.    PHYSICAL EXAM: Blood pressure 102/62, pulse 70, temperature 98.2 F (36.8 C), temperature source Oral, height 5\' 2"  (1.575 m), weight 191 lb (86.6 kg), SpO2 99 %. Body mass index is 34.93 kg/m. Physical Exam  Constitutional: She is oriented to person, place, and time. She appears well-developed and well-nourished.  Cardiovascular: Normal rate.  Pulmonary/Chest: Effort normal.  Musculoskeletal: Normal range of motion.  Neurological: She is oriented to person, place, and time.  Skin: Skin is warm and dry.  Psychiatric: She has a normal mood and affect. Her behavior is normal.  Vitals reviewed.   RECENT LABS AND TESTS: BMET    Component Value  Date/Time   NA 139 06/11/2017 1434   K 4.4 06/11/2017 1434   CL 107 (H) 06/11/2017 1434   CO2 20 06/11/2017 1434   GLUCOSE 80 06/11/2017 1434   GLUCOSE 83 05/27/2012 1119   BUN 13 06/11/2017 1434   CREATININE 0.72 06/11/2017 1434   CREATININE 0.81 05/27/2012 1119   CALCIUM 8.8 06/11/2017 1434   GFRNONAA 99 06/11/2017 1434   GFRAA 114 06/11/2017 1434   Lab Results  Component Value Date   HGBA1C 5.1 06/11/2017   HGBA1C 5.5 02/17/2017   HGBA1C 5.3 10/08/2016   HGBA1C 5.7 (H) 06/25/2016   Lab Results  Component Value Date   INSULIN 6.5 06/11/2017   INSULIN 12.3 02/17/2017   INSULIN 9.7 10/08/2016   INSULIN 12.7 06/25/2016   CBC    Component Value Date/Time   WBC 8.3 06/25/2016 1053   WBC 6.7 05/27/2012 1119   RBC 4.15 06/25/2016 1053   RBC 4.46 05/27/2012 1119   HGB 12.2 06/25/2016 1053   HCT 37.1 06/25/2016 1053   PLT 317 05/27/2012 1119   MCV 89 06/25/2016 1053   MCH 29.4 06/25/2016 1053   MCH 30.7 05/27/2012 1119   MCHC 32.9 06/25/2016 1053   MCHC 33.7 05/27/2012 1119   RDW 14.9 06/25/2016 1053   LYMPHSABS 2.4 06/25/2016 1053   MONOABS 0.4 05/27/2012 1119   EOSABS 0.2 06/25/2016 1053   BASOSABS 0.0 06/25/2016 1053   Iron/TIBC/Ferritin/ %Sat No results found for: IRON, TIBC, FERRITIN, IRONPCTSAT Lipid Panel     Component Value Date/Time   CHOL 196 06/11/2017 1434   TRIG 61 06/11/2017 1434   HDL 44 06/11/2017 1434   LDLCALC 140 (H) 06/11/2017 1434   Hepatic Function Panel     Component Value Date/Time   PROT 6.4 06/11/2017 1434   ALBUMIN 4.0 06/11/2017 1434   AST 13 06/11/2017 1434   ALT 15 06/11/2017 1434   ALKPHOS 58 06/11/2017 1434   BILITOT 0.3 06/11/2017 1434      Component Value Date/Time   TSH 1.500 10/08/2016 1025   TSH 1.630 06/25/2016 1053    ASSESSMENT AND PLAN: Seasonal allergic rhinitis due to other allergic trigger - Plan: fluticasone (FLONASE) 50 MCG/ACT nasal spray  At risk for diabetes mellitus  Class 2 severe obesity with  serious comorbidity and body mass index (BMI) of 35.0 to 35.9 in adult, unspecified obesity type (Grace Horton) - Plan: Lorcaserin HCl (BELVIQ) 10 MG TABS  PLAN:  Allergic Rhinitis Grace Horton agrees to start Flonase 50 mcg 2 sprays into each nostril qd for 1 month with no refills. Grace Horton agrees to follow up with our clinic in 3 weeks.  Diabetes risk counselling Grace Horton was given extended (15 minutes) diabetes prevention counseling today. She is 47 y.o. female and has risk factors for diabetes including obesity.  We discussed intensive lifestyle modifications today with an emphasis on weight loss as well as increasing exercise and decreasing simple carbohydrates in her diet.  Obesity Grace Horton is currently in the action stage of change. As such, her goal is to continue with weight loss efforts She has agreed to keep a food journal with 1000-1200 calories and 70+ grams of protein daily Grace Horton has been instructed to work up to a goal of 150 minutes of combined cardio and strengthening exercise per week for weight loss and overall health benefits. We discussed the following Behavioral Modification Strategies today: increasing lean protein intake and decreasing simple carbohydrates  We discussed various medication options to help Grace Horton with her weight loss efforts and we both agreed to continue taking Belviq 10 mg BID #60 and we will refill for 1 month.  Grace Horton has agreed to follow up with our clinic in 3 weeks. She was informed of the importance of frequent follow up visits to maximize her success with intensive lifestyle modifications for her multiple health conditions.   I, Trixie Dredge, am acting as transcriptionist for Dennard Nip, MD  I have reviewed the above documentation for accuracy and completeness, and I agree with the above. -Dennard Nip, MD

## 2017-10-21 ENCOUNTER — Ambulatory Visit (INDEPENDENT_AMBULATORY_CARE_PROVIDER_SITE_OTHER): Payer: BC Managed Care – PPO | Admitting: Family Medicine

## 2017-10-21 VITALS — BP 101/65 | HR 74 | Temp 98.1°F | Ht 62.0 in | Wt 190.0 lb

## 2017-10-21 DIAGNOSIS — Z9189 Other specified personal risk factors, not elsewhere classified: Secondary | ICD-10-CM | POA: Diagnosis not present

## 2017-10-21 DIAGNOSIS — R7303 Prediabetes: Secondary | ICD-10-CM | POA: Diagnosis not present

## 2017-10-21 DIAGNOSIS — Z6834 Body mass index (BMI) 34.0-34.9, adult: Secondary | ICD-10-CM

## 2017-10-21 DIAGNOSIS — E559 Vitamin D deficiency, unspecified: Secondary | ICD-10-CM

## 2017-10-21 DIAGNOSIS — E669 Obesity, unspecified: Secondary | ICD-10-CM | POA: Diagnosis not present

## 2017-10-21 MED ORDER — VITAMIN D (ERGOCALCIFEROL) 1.25 MG (50000 UNIT) PO CAPS: 50000.0000 [IU] | ORAL_CAPSULE | ORAL | 0 refills | Status: DC

## 2017-10-21 MED ORDER — METFORMIN HCL 500 MG PO TABS: 500.0000 mg | ORAL_TABLET | Freq: Two times a day (BID) | ORAL | 0 refills | Status: DC

## 2017-10-22 NOTE — Progress Notes (Signed)
Office: [phone]  /  Fax: [phone]   HPI:   Chief Complaint: OBESITY Grace Horton is here to discuss her progress with her obesity treatment plan. She is on the keep a food journal with 1000 to 1200 calories and 70+ grams of protein daily plan and is following her eating plan approximately 90 % of the time. She states she is exercising 0 minutes 0 times per week. Grace Horton is on Belviq two times daily and she feels her energy has decreased in the morning, but not enough to stop taking Belviq. She has increased traveling on the weekends with increased eating out and decreased meal prepping while she was extra busy. Her weight is 190 lb (86.2 kg) today and has had a weight loss of 1 pound over a period of 3 weeks since her last visit. She has lost 29 lbs since starting treatment with [name].  Vitamin D deficiency Grace Horton has a diagnosis of vitamin D deficiency. Grace Horton is stable on vit D and she is due for labs soon. Grace Horton denies nausea, vomiting or muscle weakness.  Pre-Diabetes Grace Horton has a diagnosis of prediabetes based on her elevated Hgb A1c and was informed this puts her at greater risk of developing diabetes. She is taking metformin currently and continues to work on diet and exercise to decrease risk of diabetes. She denies nausea, vomiting or hypoglycemia.  At risk for cardiovascular disease Grace Horton is at a higher than average risk for cardiovascular disease due to obesity. She currently denies any chest pain.  ALLERGIES: No Known Allergies  MEDICATIONS: Current Outpatient Medications on File Prior to Visit  Medication Sig Dispense Refill  . Diclofenac Sodium (PENNSAID) 2 % SOLN Fingertip amount to affected area twice daily 1 Bottle 2  . fluticasone (FLONASE) 50 MCG/ACT nasal spray Place 2 sprays into both nostrils daily. 16 g 0  . levonorgestrel-ethinyl estradiol (AVIANE,ALESSE,LESSINA) 0.1-20 MG-MCG tablet Take 1 tablet by mouth daily.    . Lorcaserin HCl (BELVIQ) 10 MG TABS Take 1  tablet by mouth 2 (two) times daily. 60 tablet 0   No current facility-administered medications on file prior to visit.     PAST MEDICAL HISTORY: Past Medical History:  Diagnosis Date  . AC (acromioclavicular) joint bone spurs   . Anxiety   . Back pain   . Chondromalacia   . Depression   . GERD (gastroesophageal reflux disease)   . History of shingles 2011   Around Left Eye  . Joint pain   . Raynaud disease   . Swelling    in feet or legs  . Vitamin D deficiency     PAST SURGICAL HISTORY: Past Surgical History:  Procedure Laterality Date  . CESAREAN SECTION    . SHOULDER SURGERY Left 2007  . TONSILLECTOMY      SOCIAL HISTORY: Social History   Tobacco Use  . Smoking status: Never Smoker  . Smokeless tobacco: Never Used  Substance Use Topics  . Alcohol use: No  . Drug use: No    FAMILY HISTORY: Family History  Problem Relation Age of Onset  . Heart disease Father   . Diabetes Father   . Kidney disease Father        Stage III   . Transient ischemic attack Father        S/P Carotid endarterectomy  . Hypertension Father   . Hyperlipidemia Father   . Irritable bowel syndrome Mother   . Hypertension Mother   . Hyperlipidemia Mother   . Obesity Mother  ROS: Review of Systems  Constitutional: Positive for weight loss.  Cardiovascular: Negative for chest pain.  Gastrointestinal: Negative for nausea and vomiting.  Musculoskeletal:       Negative for muscle weakness  Endo/Heme/Allergies:       Negative for hypoglycemia    PHYSICAL EXAM: Blood pressure 101/65, pulse 74, temperature 98.1 F (36.7 C), temperature source Oral, height 5\' 2"  (1.575 m), weight 190 lb (86.2 kg), SpO2 100 %. Body mass index is 34.75 kg/m. Physical Exam  Constitutional: She is oriented to person, place, and time. She appears well-developed and well-nourished.  Cardiovascular: Normal rate.  Pulmonary/Chest: Effort normal.  Musculoskeletal: Normal range of motion.    Neurological: She is oriented to person, place, and time.  Skin: Skin is warm and dry.  Psychiatric: She has a normal mood and affect. Her behavior is normal.  Vitals reviewed.   RECENT LABS AND TESTS: BMET    Component Value Date/Time   NA 139 06/11/2017 1434   K 4.4 06/11/2017 1434   CL 107 (H) 06/11/2017 1434   CO2 20 06/11/2017 1434   GLUCOSE 80 06/11/2017 1434   GLUCOSE 83 05/27/2012 1119   BUN 13 06/11/2017 1434   CREATININE 0.72 06/11/2017 1434   CREATININE 0.81 05/27/2012 1119   CALCIUM 8.8 06/11/2017 1434   GFRNONAA 99 06/11/2017 1434   GFRAA 114 06/11/2017 1434   Lab Results  Component Value Date   HGBA1C 5.1 06/11/2017   HGBA1C 5.5 02/17/2017   HGBA1C 5.3 10/08/2016   HGBA1C 5.7 (H) 06/25/2016   Lab Results  Component Value Date   INSULIN 6.5 06/11/2017   INSULIN 12.3 02/17/2017   INSULIN 9.7 10/08/2016   INSULIN 12.7 06/25/2016   CBC    Component Value Date/Time   WBC 8.3 06/25/2016 1053   WBC 6.7 05/27/2012 1119   RBC 4.15 06/25/2016 1053   RBC 4.46 05/27/2012 1119   HGB 12.2 06/25/2016 1053   HCT 37.1 06/25/2016 1053   PLT 317 05/27/2012 1119   MCV 89 06/25/2016 1053   MCH 29.4 06/25/2016 1053   MCH 30.7 05/27/2012 1119   MCHC 32.9 06/25/2016 1053   MCHC 33.7 05/27/2012 1119   RDW 14.9 06/25/2016 1053   LYMPHSABS 2.4 06/25/2016 1053   MONOABS 0.4 05/27/2012 1119   EOSABS 0.2 06/25/2016 1053   BASOSABS 0.0 06/25/2016 1053   Iron/TIBC/Ferritin/ %Sat No results found for: IRON, TIBC, FERRITIN, IRONPCTSAT Lipid Panel     Component Value Date/Time   CHOL 196 06/11/2017 1434   TRIG 61 06/11/2017 1434   HDL 44 06/11/2017 1434   LDLCALC 140 (H) 06/11/2017 1434   Hepatic Function Panel     Component Value Date/Time   PROT 6.4 06/11/2017 1434   ALBUMIN 4.0 06/11/2017 1434   AST 13 06/11/2017 1434   ALT 15 06/11/2017 1434   ALKPHOS 58 06/11/2017 1434   BILITOT 0.3 06/11/2017 1434      Component Value Date/Time   TSH 1.500 10/08/2016  1025   TSH 1.630 06/25/2016 1053   Results for KOURTNEI, RAUBER (MRN [id number]) as of 10/22/2017 16:04  Ref. Range 06/11/2017 14:34  Vitamin D, 25-Hydroxy Latest Ref Range: 30.0 - 100.0 ng/mL 36.0   ASSESSMENT AND PLAN: Vitamin D deficiency - Plan: Vitamin D, Ergocalciferol, (DRISDOL) 50000 units CAPS capsule  Prediabetes - Plan: metFORMIN (GLUCOPHAGE) 500 MG tablet  At risk for heart disease  Class 1 obesity with serious comorbidity and body mass index (BMI) of 34.0 to 34.9 in adult, unspecified obesity  type  PLAN:  Vitamin D Deficiency Grace Horton was informed that low vitamin D levels contributes to fatigue and are associated with obesity, breast, and colon cancer. She agrees to continue to take prescription Vit D @50 ,000 IU every week #4 with no refills and will follow up for routine testing of vitamin D, at least 2-3 times per year. She was informed of the risk of over-replacement of vitamin D and agrees to not increase her dose unless she discusses this with [name] first. We will recheck labs next month and Kidada agrees to follow up as directed.  Pre-Diabetes Grace Horton will continue to work on weight loss, exercise, and decreasing simple carbohydrates in her diet to help decrease the risk of diabetes. We dicussed metformin including benefits and risks. She was informed that eating too many simple carbohydrates or too many calories at one sitting increases the likelihood of GI side effects. Grace Horton agreed to continue metformin 500 mg two times daily  and a prescription was written today for 1 month refill. We will recheck labs next month and Grace Horton agreed to follow up with [name] as directed to monitor her progress.  Cardiovascular risk counseling Grace Horton was given extended (15 minutes) coronary artery disease prevention counseling today. She is 48 y.o. female and has risk factors for heart disease including obesity. We discussed intensive lifestyle modifications today with an emphasis on specific weight  loss instructions and strategies. Pt was also informed of the importance of increasing exercise and decreasing saturated fats to help prevent heart disease.  Obesity Grace Horton is currently in the action stage of change. As such, her goal is to continue with weight loss efforts She has agreed to keep a food journal with 1000 to 1200 calories and 70+ grams of protein  Grace Horton has been instructed to work up to a goal of 150 minutes of combined cardio and strengthening exercise per week for weight loss and overall health benefits. We discussed the following Behavioral Modification Strategies today: work on meal planning and easy cooking plans and travel eating strategies  We discussed various medication options to help Grace Horton with her weight loss efforts and we both agreed to continue Belviq 10 mg twice daily and we will refill for 1 month.  Grace Horton has agreed to follow up with our clinic in 3 to 4 weeks fasting. She was informed of the importance of frequent follow up visits to maximize her success with intensive lifestyle modifications for her multiple health conditions.   OBESITY BEHAVIORAL INTERVENTION VISIT  Today's visit was # 27  Starting weight: 219 lbs Starting date: 06/25/16 Today's weight : 190 lbs  Today's date: 10/21/2017 Total lbs lost to date: 69   ASK: We discussed the diagnosis of obesity with Alben Spittle today and Mabrey agreed to give [name] permission to discuss obesity behavioral modification therapy today.  ASSESS: Thailyn has the diagnosis of obesity and her BMI today is 34.74 Rada is in the action stage of change   ADVISE: Doreather was educated on the multiple health risks of obesity as well as the benefit of weight loss to improve her health. She was advised of the need for long term treatment and the importance of lifestyle modifications to improve her current health and to decrease her risk of future health problems.  AGREE: Multiple dietary modification options and  treatment options were discussed and  Aubrina agreed to follow the recommendations documented in the above note.  ARRANGE: Erandi was educated on the importance of frequent visits to treat obesity  as outlined per CMS and USPSTF guidelines and agreed to schedule her next follow up appointment today.  I, Grace Horton, am acting as transcriptionist for Dennard Nip, MD  I have reviewed the above documentation for accuracy and completeness, and I agree with the above. -Dennard Nip, MD

## 2017-10-27 ENCOUNTER — Other Ambulatory Visit (INDEPENDENT_AMBULATORY_CARE_PROVIDER_SITE_OTHER): Payer: Self-pay | Admitting: Family Medicine

## 2017-10-27 DIAGNOSIS — J3089 Other allergic rhinitis: Secondary | ICD-10-CM

## 2017-11-03 ENCOUNTER — Encounter (INDEPENDENT_AMBULATORY_CARE_PROVIDER_SITE_OTHER): Payer: Self-pay | Admitting: Family Medicine

## 2017-11-03 ENCOUNTER — Other Ambulatory Visit (INDEPENDENT_AMBULATORY_CARE_PROVIDER_SITE_OTHER): Payer: Self-pay

## 2017-11-03 DIAGNOSIS — Z6835 Body mass index (BMI) 35.0-35.9, adult: Principal | ICD-10-CM

## 2017-11-03 MED ORDER — LORCASERIN HCL 10 MG PO TABS: 1.0000 | ORAL_TABLET | Freq: Two times a day (BID) | ORAL | 0 refills | Status: DC

## 2017-11-03 NOTE — Telephone Encounter (Signed)
Can you check on this?

## 2017-11-17 ENCOUNTER — Ambulatory Visit (INDEPENDENT_AMBULATORY_CARE_PROVIDER_SITE_OTHER): Payer: BC Managed Care – PPO | Admitting: Family Medicine

## 2017-11-17 VITALS — BP 116/63 | HR 69 | Temp 98.2°F | Ht 62.0 in | Wt 193.0 lb

## 2017-11-17 DIAGNOSIS — R7303 Prediabetes: Secondary | ICD-10-CM | POA: Diagnosis not present

## 2017-11-17 DIAGNOSIS — Z9189 Other specified personal risk factors, not elsewhere classified: Secondary | ICD-10-CM

## 2017-11-17 DIAGNOSIS — E559 Vitamin D deficiency, unspecified: Secondary | ICD-10-CM | POA: Diagnosis not present

## 2017-11-17 DIAGNOSIS — F3289 Other specified depressive episodes: Secondary | ICD-10-CM | POA: Diagnosis not present

## 2017-11-17 DIAGNOSIS — Z6835 Body mass index (BMI) 35.0-35.9, adult: Secondary | ICD-10-CM

## 2017-11-17 MED ORDER — LORCASERIN HCL 10 MG PO TABS: 1.0000 | ORAL_TABLET | Freq: Two times a day (BID) | ORAL | 0 refills | Status: DC

## 2017-11-17 MED ORDER — METFORMIN HCL 500 MG PO TABS: 500.0000 mg | ORAL_TABLET | Freq: Two times a day (BID) | ORAL | 0 refills | Status: DC

## 2017-11-17 MED ORDER — VITAMIN D (ERGOCALCIFEROL) 1.25 MG (50000 UNIT) PO CAPS: 50000.0000 [IU] | ORAL_CAPSULE | ORAL | 0 refills | Status: DC

## 2017-11-18 ENCOUNTER — Other Ambulatory Visit (INDEPENDENT_AMBULATORY_CARE_PROVIDER_SITE_OTHER): Payer: Self-pay | Admitting: Family Medicine

## 2017-11-18 DIAGNOSIS — E559 Vitamin D deficiency, unspecified: Secondary | ICD-10-CM

## 2017-11-18 LAB — COMPREHENSIVE METABOLIC PANEL
ALBUMIN: 3.9 g/dL (ref 3.5–5.5)
ALT: 9 IU/L (ref 0–32)
AST: 10 IU/L (ref 0–40)
Albumin/Globulin Ratio: 2.1 (ref 1.2–2.2)
Alkaline Phosphatase: 60 IU/L (ref 39–117)
BILIRUBIN TOTAL: 0.3 mg/dL (ref 0.0–1.2)
BUN / CREAT RATIO: 20 (ref 9–23)
BUN: 13 mg/dL (ref 6–24)
CHLORIDE: 105 mmol/L (ref 96–106)
CO2: 24 mmol/L (ref 20–29)
Calcium: 8.7 mg/dL (ref 8.7–10.2)
Creatinine, Ser: 0.66 mg/dL (ref 0.57–1.00)
GFR calc Af Amer: 120 mL/min/{1.73_m2} (ref >59)
GFR calc non Af Amer: 104 mL/min/{1.73_m2} (ref >59)
GLOBULIN, TOTAL: 1.9 g/dL (ref 1.5–4.5)
GLUCOSE: 87 mg/dL (ref 65–99)
Potassium: 4.7 mmol/L (ref 3.5–5.2)
SODIUM: 142 mmol/L (ref 134–144)
Total Protein: 5.8 g/dL — ABNORMAL LOW (ref 6.0–8.5)

## 2017-11-18 LAB — CBC WITH DIFFERENTIAL
BASOS: 0 %
Basophils Absolute: 0 10*3/uL (ref 0.0–0.2)
EOS (ABSOLUTE): 0.1 10*3/uL (ref 0.0–0.4)
EOS: 1 %
HEMATOCRIT: 37.7 % (ref 34.0–46.6)
Hemoglobin: 12.8 g/dL (ref 11.1–15.9)
Immature Grans (Abs): 0 10*3/uL (ref 0.0–0.1)
Immature Granulocytes: 0 %
Lymphocytes Absolute: 1.9 10*3/uL (ref 0.7–3.1)
Lymphs: 32 %
MCH: 31.1 pg (ref 26.6–33.0)
MCHC: 34 g/dL (ref 31.5–35.7)
MCV: 92 fL (ref 79–97)
Monocytes Absolute: 0.4 10*3/uL (ref 0.1–0.9)
Monocytes: 6 %
Neutrophils Absolute: 3.6 10*3/uL (ref 1.4–7.0)
Neutrophils: 61 %
RBC: 4.12 x10E6/uL (ref 3.77–5.28)
RDW: 12.2 % — AB (ref 12.3–15.4)
WBC: 6 10*3/uL (ref 3.4–10.8)

## 2017-11-18 LAB — HEMOGLOBIN A1C
ESTIMATED AVERAGE GLUCOSE: 103 mg/dL
Hgb A1c MFr Bld: 5.2 % (ref 4.8–5.6)

## 2017-11-18 LAB — LIPID PANEL WITH LDL/HDL RATIO
Cholesterol, Total: 161 mg/dL (ref 100–199)
HDL: 41 mg/dL (ref >39)
LDL CALC: 110 mg/dL — AB (ref 0–99)
LDL/HDL RATIO: 2.7 ratio (ref 0.0–3.2)
Triglycerides: 50 mg/dL (ref 0–149)
VLDL Cholesterol Cal: 10 mg/dL (ref 5–40)

## 2017-11-18 LAB — VITAMIN D 25 HYDROXY (VIT D DEFICIENCY, FRACTURES): Vit D, 25-Hydroxy: 60.7 ng/mL (ref 30.0–100.0)

## 2017-11-18 LAB — INSULIN, RANDOM: INSULIN: 8.8 u[IU]/mL (ref 2.6–24.9)

## 2017-11-18 NOTE — Progress Notes (Signed)
Office: [phone]  /  Fax: [phone]   HPI:   Chief Complaint: OBESITY Grace Horton is here to discuss her progress with her obesity treatment plan. She is keeping a food journal with 1000 to 1200 calories and 70+ grams of protein and is following her eating plan approximately 50 % of the time. She states she is exercising 0 minutes 0 times per week. Grace Horton has not been able to journal well as she has been traveling recently. She has been dining out more. She is having an occasional Coke (2 times a week) and occasional alcohol. She denies hunger and cravings. She reports good sleep and feels tired. Grace Horton is taking Belviq and it has decreased her polyphagia.  Her weight is 193 lb (87.5 kg) today and has had a weight gain of 3 pounds in 3 weeks since her last visit. She has lost 26 lbs since starting treatment with [name].  Pre-Diabetes Grace Horton has a diagnosis of pre-diabetes based on her elevated Hgb A1c and was informed this puts her at greater risk of developing diabetes. She is taking metformin currently and is stable. She continues to work on diet and exercise to decrease risk of diabetes.   At risk for diabetes Grace Horton is at higher than average risk for developing diabetes due to her pre-diabetes and obesity.  Vitamin D deficiency Grace Horton has a diagnosis of vitamin D deficiency. Her vitamin D level is stable, though not at goal. She is currently taking vit D and denies nausea, vomiting or muscle weakness.  Depression with emotional eating behaviors Grace Horton is struggling with emotional eating and using food for comfort to the extent that it is negatively impacting her health. She reports lack of motivation to make healthy choices. She is having increased polyphagia and cravings.  ALLERGIES: No Known Allergies  MEDICATIONS: Current Outpatient Medications on File Prior to Visit  Medication Sig Dispense Refill  . Diclofenac Sodium (PENNSAID) 2 % SOLN Fingertip amount to affected area twice  daily 1 Bottle 2  . fluticasone (FLONASE) 50 MCG/ACT nasal spray Place 2 sprays into both nostrils daily. 16 g 0  . levonorgestrel-ethinyl estradiol (AVIANE,ALESSE,LESSINA) 0.1-20 MG-MCG tablet Take 1 tablet by mouth daily.     No current facility-administered medications on file prior to visit.     PAST MEDICAL HISTORY: Past Medical History:  Diagnosis Date  . AC (acromioclavicular) joint bone spurs   . Anxiety   . Back pain   . Chondromalacia   . Depression   . GERD (gastroesophageal reflux disease)   . History of shingles 2011   Around Left Eye  . Joint pain   . Raynaud disease   . Swelling    in feet or legs  . Vitamin D deficiency     PAST SURGICAL HISTORY: Past Surgical History:  Procedure Laterality Date  . CESAREAN SECTION    . SHOULDER SURGERY Left 2007  . TONSILLECTOMY      SOCIAL HISTORY: Social History   Tobacco Use  . Smoking status: Never Smoker  . Smokeless tobacco: Never Used  Substance Use Topics  . Alcohol use: No  . Drug use: No    FAMILY HISTORY: Family History  Problem Relation Age of Onset  . Heart disease Father   . Diabetes Father   . Kidney disease Father        Stage III   . Transient ischemic attack Father        S/P Carotid endarterectomy  . Hypertension Father   . Hyperlipidemia  Father   . Irritable bowel syndrome Mother   . Hypertension Mother   . Hyperlipidemia Mother   . Obesity Mother     ROS: Review of Systems  Constitutional: Negative for weight loss.  Gastrointestinal: Negative for nausea and vomiting.  Musculoskeletal:       Negative for muscle weakness.  Endo/Heme/Allergies:       Positive for polyphagia.  Psychiatric/Behavioral: Positive for depression.    PHYSICAL EXAM: Blood pressure 116/63, pulse 69, temperature 98.2 F (36.8 C), temperature source Oral, height 5\' 2"  (1.575 m), weight 193 lb (87.5 kg), SpO2 98 %. Body mass index is 35.3 kg/m. Physical Exam  Constitutional: She is oriented to  person, place, and time. She appears well-developed and well-nourished.  Cardiovascular: Normal rate.  Pulmonary/Chest: Effort normal.  Musculoskeletal: Normal range of motion.  Neurological: She is oriented to person, place, and time.  Skin: Skin is warm and dry.  Psychiatric: She has a normal mood and affect. Her behavior is normal.  Vitals reviewed.   RECENT LABS AND TESTS: BMET    Component Value Date/Time   NA 142 11/17/2017 1132   K 4.7 11/17/2017 1132   CL 105 11/17/2017 1132   CO2 24 11/17/2017 1132   GLUCOSE 87 11/17/2017 1132   GLUCOSE 83 05/27/2012 1119   BUN 13 11/17/2017 1132   CREATININE 0.66 11/17/2017 1132   CREATININE 0.81 05/27/2012 1119   CALCIUM 8.7 11/17/2017 1132   GFRNONAA 104 11/17/2017 1132   GFRAA 120 11/17/2017 1132   Lab Results  Component Value Date   HGBA1C 5.2 11/17/2017   HGBA1C 5.1 06/11/2017   HGBA1C 5.5 02/17/2017   HGBA1C 5.3 10/08/2016   HGBA1C 5.7 (H) 06/25/2016   Lab Results  Component Value Date   INSULIN 8.8 11/17/2017   INSULIN 6.5 06/11/2017   INSULIN 12.3 02/17/2017   INSULIN 9.7 10/08/2016   INSULIN 12.7 06/25/2016   CBC    Component Value Date/Time   WBC 6.0 11/17/2017 1132   WBC 6.7 05/27/2012 1119   RBC 4.12 11/17/2017 1132   RBC 4.46 05/27/2012 1119   HGB 12.8 11/17/2017 1132   HCT 37.7 11/17/2017 1132   PLT 317 05/27/2012 1119   MCV 92 11/17/2017 1132   MCH 31.1 11/17/2017 1132   MCH 30.7 05/27/2012 1119   MCHC 34.0 11/17/2017 1132   MCHC 33.7 05/27/2012 1119   RDW 12.2 (L) 11/17/2017 1132   LYMPHSABS 1.9 11/17/2017 1132   MONOABS 0.4 05/27/2012 1119   EOSABS 0.1 11/17/2017 1132   BASOSABS 0.0 11/17/2017 1132   Iron/TIBC/Ferritin/ %Sat No results found for: IRON, TIBC, FERRITIN, IRONPCTSAT Lipid Panel     Component Value Date/Time   CHOL 161 11/17/2017 1132   TRIG 50 11/17/2017 1132   HDL 41 11/17/2017 1132   LDLCALC 110 (H) 11/17/2017 1132   Hepatic Function Panel     Component Value  Date/Time   PROT 5.8 (L) 11/17/2017 1132   ALBUMIN 3.9 11/17/2017 1132   AST 10 11/17/2017 1132   ALT 9 11/17/2017 1132   ALKPHOS 60 11/17/2017 1132   BILITOT 0.3 11/17/2017 1132      Component Value Date/Time   TSH 1.500 10/08/2016 1025   TSH 1.630 06/25/2016 1053   Results for GERTURDE, KUBA (MRN [id number]) as of 11/18/2017 09:32  Ref. Range 11/17/2017 11:32  Vitamin D, 25-Hydroxy Latest Ref Range: 30.0 - 100.0 ng/mL 60.7   ASSESSMENT AND PLAN: Prediabetes - Plan: CBC With Differential, Comprehensive metabolic panel, Hemoglobin A1c, Insulin,  random, Lipid Panel With LDL/HDL Ratio, metFORMIN (GLUCOPHAGE) 500 MG tablet  Vitamin D deficiency - Plan: VITAMIN D 25 Hydroxy (Vit-D Deficiency, Fractures), Vitamin D, Ergocalciferol, (DRISDOL) 50000 units CAPS capsule  Other depression - with emotional eating  At risk for diabetes mellitus  Class 2 severe obesity with serious comorbidity and body mass index (BMI) of 35.0 to 35.9 in adult, unspecified obesity type ([place]) - Plan: Lorcaserin HCl (BELVIQ) 10 MG TABS  PLAN:  Pre-Diabetes Grace Horton will continue to work on weight loss, exercise, and decreasing simple carbohydrates in her diet to help decrease the risk of diabetes. She was informed that eating too many simple carbohydrates or too many calories at one sitting increases the likelihood of GI side effects. Grace Horton agrees to continue to take metformin 500mg  BID #60 with no refills and a prescription was written today. She agrees to continue her diet and decrease sugary, sweet beverages. We will draw labs today. Mammie agreed to follow up with [name] as directed to monitor her progress in 2 to 3 weeks.  Diabetes risk counseling Grace Horton was given extended (15 minutes) diabetes prevention counseling today. She is 47 y.o. female and has risk factors for diabetes including pre-diabetes and obesity. We discussed intensive lifestyle modifications today with an emphasis on weight loss as well as  increasing exercise and decreasing simple carbohydrates in her diet.  Vitamin D Deficiency Grace Horton was informed that low vitamin D levels contributes to fatigue and are associated with obesity, breast, and colon cancer. She agrees to continue to take prescription Vit D @50 ,000 IU every week #4 with no refills and will follow up for routine testing of vitamin D, at least 2-3 times per year. She was informed of the risk of over-replacement of vitamin D and agrees to not increase her dose unless she discusses this with [name] first. Grace Horton agreed to follow up as directed.  Depression with Emotional Eating Behaviors We discussed behavior modification techniques today to help Grace Horton deal with her emotional eating and depression. She has agreed to start to take Wellbutrin SR 150 mg qd and agreed to follow up as directed in 2 to 3 weeks.  Obesity Grace Horton is currently in the action stage of change. As such, her goal is to continue with weight loss efforts. She has agreed to keep a food journal with 1000 to 1200 calories and 70+ grams of protein.  Grace Horton has been instructed to work up to a goal of 150 minutes of combined cardio and strengthening exercise per week for weight loss and overall health benefits. We discussed the following Behavioral Modification Strategies today: increasing lean protein intake, decreasing simple carbohydrates, increase H2O intake, decrease liquid calories, travel eating strategies, planning for success, and keeping a strict food journal. We discussed various medication options to help Grace Horton with her weight loss efforts and we both agreed to continue Belviq 10mg  BID # 60 with no refills.  Grace Horton has agreed to follow up with our clinic in 2 to 3 weeks. She was informed of the importance of frequent follow up visits to maximize her success with intensive lifestyle modifications for her multiple health conditions.   OBESITY BEHAVIORAL INTERVENTION VISIT  Today's visit was #  28  Starting weight: 219 lbs Starting date: 06/25/16 Today's weight : Weight: 193 lb (87.5 kg)  Today's date: 11/17/2017 Total lbs lost to date: 75  ASK: We discussed the diagnosis of obesity with Grace Horton today and Grace Horton agreed to give [name] permission to discuss obesity behavioral modification therapy  today.  ASSESS: Grace Horton has the diagnosis of obesity and her BMI today is 35.29. Grace Horton is in the action stage of change.   ADVISE: Grace Horton was educated on the multiple health risks of obesity as well as the benefit of weight loss to improve her health. She was advised of the need for long term treatment and the importance of lifestyle modifications to improve her current health and to decrease her risk of future health problems.  AGREE: Multiple dietary modification options and treatment options were discussed and Grace Horton agreed to follow the recommendations documented in the above note.  ARRANGE: Grace Horton was educated on the importance of frequent visits to treat obesity as outlined per CMS and USPSTF guidelines and agreed to schedule her next follow up appointment today.  I, Marcille Blanco, am acting as transcriptionist for Starlyn Skeans, MD  I have reviewed the above documentation for accuracy and completeness, and I agree with the above. -Dennard Nip, MD

## 2017-11-20 ENCOUNTER — Other Ambulatory Visit (INDEPENDENT_AMBULATORY_CARE_PROVIDER_SITE_OTHER): Payer: Self-pay | Admitting: Family Medicine

## 2017-11-20 DIAGNOSIS — J3089 Other allergic rhinitis: Secondary | ICD-10-CM

## 2017-12-01 ENCOUNTER — Telehealth (INDEPENDENT_AMBULATORY_CARE_PROVIDER_SITE_OTHER): Payer: Self-pay | Admitting: Family Medicine

## 2017-12-01 NOTE — Telephone Encounter (Signed)
Pt states was to be put back on Wellbutrin 150 at last visit but has not been called in. Please advise. drh

## 2017-12-02 ENCOUNTER — Other Ambulatory Visit (INDEPENDENT_AMBULATORY_CARE_PROVIDER_SITE_OTHER): Payer: Self-pay

## 2017-12-02 ENCOUNTER — Encounter (INDEPENDENT_AMBULATORY_CARE_PROVIDER_SITE_OTHER): Payer: Self-pay

## 2017-12-02 NOTE — Telephone Encounter (Signed)
Sent the patient a my chart message. April, [place]

## 2017-12-03 ENCOUNTER — Encounter (INDEPENDENT_AMBULATORY_CARE_PROVIDER_SITE_OTHER): Payer: Self-pay

## 2017-12-03 ENCOUNTER — Ambulatory Visit (INDEPENDENT_AMBULATORY_CARE_PROVIDER_SITE_OTHER): Payer: BC Managed Care – PPO | Admitting: Family Medicine

## 2017-12-10 ENCOUNTER — Ambulatory Visit (INDEPENDENT_AMBULATORY_CARE_PROVIDER_SITE_OTHER): Payer: BC Managed Care – PPO | Admitting: Family Medicine

## 2017-12-10 ENCOUNTER — Encounter (INDEPENDENT_AMBULATORY_CARE_PROVIDER_SITE_OTHER): Payer: Self-pay | Admitting: Family Medicine

## 2017-12-10 VITALS — BP 139/84 | HR 69 | Temp 98.3°F | Ht 62.0 in | Wt 192.0 lb

## 2017-12-10 DIAGNOSIS — E559 Vitamin D deficiency, unspecified: Secondary | ICD-10-CM

## 2017-12-10 DIAGNOSIS — Z9189 Other specified personal risk factors, not elsewhere classified: Secondary | ICD-10-CM | POA: Diagnosis not present

## 2017-12-10 DIAGNOSIS — F3289 Other specified depressive episodes: Secondary | ICD-10-CM

## 2017-12-10 DIAGNOSIS — Z6835 Body mass index (BMI) 35.0-35.9, adult: Secondary | ICD-10-CM

## 2017-12-10 MED ORDER — VITAMIN D (ERGOCALCIFEROL) 1.25 MG (50000 UNIT) PO CAPS: 50000.0000 [IU] | ORAL_CAPSULE | ORAL | 0 refills | Status: DC

## 2017-12-10 MED ORDER — BUPROPION HCL ER (SR) 150 MG PO TB12: 150.0000 mg | ORAL_TABLET | Freq: Every day | ORAL | 0 refills | Status: DC

## 2017-12-10 NOTE — Progress Notes (Signed)
Office: [phone]  /  Fax: [phone]   HPI:   Chief Complaint: OBESITY Grace Horton is here to discuss her progress with her obesity treatment plan. She is on the  keep a food journal with 1000-1200 calories and 70+ protein  and is following her eating plan approximately 75% of the time. She states she is walking for 45 minutes 4 times per week.  Grace Horton is meeting her protein goal daily but struggles to meet calorie goal.   She was out of her Belviq for several days and has had a headache which she believes is related to lack of Belviq. She would like to continue the Belviq as she believes it helps with appetite suppression.  Her weight is 192 lb (87.1 kg) today and has had a weight loss of 1 pounds over a period of 3 weeks since her last visit. She has lost 27 lbs since starting treatment with [name].  Vitamin D deficiency Grace Horton has a diagnosis of vitamin D deficiency. She is currently taking vit D and denies nausea, vomiting or muscle weakness. Vitamin D level is currently at goal.   At risk for osteopenia and osteoporosis Grace Horton is at higher risk of osteopenia and osteoporosis due to history of vitamin D deficiency.   Depression with emotional eating behaviors Grace Horton denies food cravings or emotional eating at this time though this has been a problem in the past. She would like to continue bupropion SR but is currently out of the medication,. She feels that the bupropion gives her energy.  She shows no sign of suicidal or homicidal ideations.  Depression screen PHQ 2/9 06/25/2016  Decreased Interest 3  Down, Depressed, Hopeless 1  PHQ - 2 Score 4  Altered sleeping 0  Tired, decreased energy 3  Change in appetite 2  Feeling bad or failure about yourself  1  Trouble concentrating 1  Moving slowly or fidgety/restless 3  Suicidal thoughts 0  PHQ-9 Score 14      ALLERGIES: No Known Allergies  MEDICATIONS: Current Outpatient Medications on File Prior to Visit  Medication Sig  Dispense Refill  . Diclofenac Sodium (PENNSAID) 2 % SOLN Fingertip amount to affected area twice daily 1 Bottle 2  . fluticasone (FLONASE) 50 MCG/ACT nasal spray 2 sprays in each nostril daily 16 g 0  . levonorgestrel-ethinyl estradiol (AVIANE,ALESSE,LESSINA) 0.1-20 MG-MCG tablet Take 1 tablet by mouth daily.    . Lorcaserin HCl (BELVIQ) 10 MG TABS Take 1 tablet by mouth 2 (two) times daily. 60 tablet 0  . metFORMIN (GLUCOPHAGE) 500 MG tablet Take 1 tablet (500 mg total) by mouth 2 (two) times daily with a meal. 60 tablet 0  . Vitamin D, Ergocalciferol, (DRISDOL) 50000 units CAPS capsule Take 1 capsule (50,000 Units total) by mouth every 7 (seven) days. 4 capsule 0   No current facility-administered medications on file prior to visit.     PAST MEDICAL HISTORY: Past Medical History:  Diagnosis Date  . AC (acromioclavicular) joint bone spurs   . Anxiety   . Back pain   . Chondromalacia   . Depression   . GERD (gastroesophageal reflux disease)   . History of shingles 2011   Around Left Eye  . Joint pain   . Raynaud disease   . Swelling    in feet or legs  . Vitamin D deficiency     PAST SURGICAL HISTORY: Past Surgical History:  Procedure Laterality Date  . CESAREAN SECTION    . SHOULDER SURGERY Left 2007  .  TONSILLECTOMY      SOCIAL HISTORY: Social History   Tobacco Use  . Smoking status: Never Smoker  . Smokeless tobacco: Never Used  Substance Use Topics  . Alcohol use: No  . Drug use: No    FAMILY HISTORY: Family History  Problem Relation Age of Onset  . Heart disease Father   . Diabetes Father   . Kidney disease Father        Stage III   . Transient ischemic attack Father        S/P Carotid endarterectomy  . Hypertension Father   . Hyperlipidemia Father   . Irritable bowel syndrome Mother   . Hypertension Mother   . Hyperlipidemia Mother   . Obesity Mother     ROS: Review of Systems  Constitutional: Positive for malaise/fatigue and weight loss.    Gastrointestinal: Negative for nausea and vomiting.  Neurological: Negative for weakness.  Endo/Heme/Allergies:       Negative for polyphagia.    PHYSICAL EXAM: Blood pressure 139/84, pulse 69, temperature 98.3 F (36.8 C), temperature source Oral, height 5\' 2"  (1.575 m), weight 192 lb (87.1 kg), SpO2 99 %. Body mass index is 35.12 kg/m. Physical Exam  Constitutional: She is oriented to person, place, and time. She appears well-developed and well-nourished.  Cardiovascular: Normal rate.  Pulmonary/Chest: Effort normal.  Musculoskeletal: Normal range of motion.  Neurological: She is alert and oriented to person, place, and time.  Skin: Skin is warm and dry.  Psychiatric: She has a normal mood and affect. Her behavior is normal.  Vitals reviewed.   RECENT LABS AND TESTS: BMET    Component Value Date/Time   NA 142 11/17/2017 1132   K 4.7 11/17/2017 1132   CL 105 11/17/2017 1132   CO2 24 11/17/2017 1132   GLUCOSE 87 11/17/2017 1132   GLUCOSE 83 05/27/2012 1119   BUN 13 11/17/2017 1132   CREATININE 0.66 11/17/2017 1132   CREATININE 0.81 05/27/2012 1119   CALCIUM 8.7 11/17/2017 1132   GFRNONAA 104 11/17/2017 1132   GFRAA 120 11/17/2017 1132   Lab Results  Component Value Date   HGBA1C 5.2 11/17/2017   HGBA1C 5.1 06/11/2017   HGBA1C 5.5 02/17/2017   HGBA1C 5.3 10/08/2016   HGBA1C 5.7 (H) 06/25/2016   Lab Results  Component Value Date   INSULIN 8.8 11/17/2017   INSULIN 6.5 06/11/2017   INSULIN 12.3 02/17/2017   INSULIN 9.7 10/08/2016   INSULIN 12.7 06/25/2016   CBC    Component Value Date/Time   WBC 6.0 11/17/2017 1132   WBC 6.7 05/27/2012 1119   RBC 4.12 11/17/2017 1132   RBC 4.46 05/27/2012 1119   HGB 12.8 11/17/2017 1132   HCT 37.7 11/17/2017 1132   PLT 317 05/27/2012 1119   MCV 92 11/17/2017 1132   MCH 31.1 11/17/2017 1132   MCH 30.7 05/27/2012 1119   MCHC 34.0 11/17/2017 1132   MCHC 33.7 05/27/2012 1119   RDW 12.2 (L) 11/17/2017 1132   LYMPHSABS  1.9 11/17/2017 1132   MONOABS 0.4 05/27/2012 1119   EOSABS 0.1 11/17/2017 1132   BASOSABS 0.0 11/17/2017 1132   Iron/TIBC/Ferritin/ %Sat No results found for: IRON, TIBC, FERRITIN, IRONPCTSAT Lipid Panel     Component Value Date/Time   CHOL 161 11/17/2017 1132   TRIG 50 11/17/2017 1132   HDL 41 11/17/2017 1132   LDLCALC 110 (H) 11/17/2017 1132   Hepatic Function Panel     Component Value Date/Time   PROT 5.8 (L) 11/17/2017 1132  ALBUMIN 3.9 11/17/2017 1132   AST 10 11/17/2017 1132   ALT 9 11/17/2017 1132   ALKPHOS 60 11/17/2017 1132   BILITOT 0.3 11/17/2017 1132      Component Value Date/Time   TSH 1.500 10/08/2016 1025   TSH 1.630 06/25/2016 1053   Results for HATTYE, SIEGFRIED (MRN [id number]) as of 12/10/2017 14:29  Ref. Range 11/17/2017 11:32  Vitamin D, 25-Hydroxy Latest Ref Range: 30.0 - 100.0 ng/mL 60.7   ASSESSMENT AND PLAN: Vitamin D deficiency  At risk for osteoporosis  Other depression - with emotional eating  Class 2 severe obesity with serious comorbidity and body mass index (BMI) of 35.0 to 35.9 in adult, unspecified obesity type ([place])  PLAN:  Vitamin D Deficiency Grace Horton was informed that low vitamin D levels contributes to fatigue and are associated with obesity, breast, and colon cancer. She agrees to continue to take prescription Vit D @50 ,000 IU every other week and will follow up for routine testing of vitamin D, at least 2-3 times per year. No prescription was provided today.  She was informed of the risk of over-replacement of vitamin D and agrees to not increase her dose unless she discusses this with [name] first.  At risk for osteopenia and osteoporosis Grace Horton was given extended  (15 minutes) osteoporosis prevention counseling today. Grace Horton is at risk for osteopenia and osteoporsis due to her history of vitamin D deficiency. She was encouraged to take her vitamin D and follow her higher calcium diet and increase strengthening exercise to help  strengthen her bones and decrease her risk of osteopenia and osteoporosis.   Depression with Emotional Eating Behaviors We discussed behavior modification techniques today to help Grace Horton deal with her emotional eating and depression. We will restart Wellbutrin SR 150 mg qd and she agreed to follow up as directed.  Obesity Merelin is currently in the action stage of change. As such, her goal is to continue with weight loss efforts She has agreed to keep a food journal with 1000-1200 calories and 70+ protein  Grace Horton has been instructed to continue walking for 45 minutes 4 times per week.  We discussed the following Behavioral Modification Strategies today: increase water intake, better snacking choices, travel eating strategies, and planning for success. She will continue Belviq for control of appetite.  Grace Horton has agreed to follow up with our clinic in 3 weeks. She was informed of the importance of frequent follow up visits to maximize her success with intensive lifestyle modifications for her multiple health conditions.   OBESITY BEHAVIORAL INTERVENTION VISIT  Today's visit was #29  Starting weight: 219 lbs Starting date: 06/25/16 Today's weight : Weight: 192 lb (87.1 kg)  Today's date: 12/10/2017 Total lbs lost to date: 24   ASK: We discussed the diagnosis of obesity with Alben Spittle today and Giana agreed to give [name] permission to discuss obesity behavioral modification therapy today.  ASSESS: Grace Horton has the diagnosis of obesity and her BMI today is 35.1. Grace Horton is in the action stage of change   ADVISE: Tacori was educated on the multiple health risks of obesity as well as the benefit of weight loss to improve her health. She was advised of the need for long term treatment and the importance of lifestyle modifications to improve her current health and to decrease her risk of future health problems.  AGREE: Multiple dietary modification options and treatment options were  discussed and  Grace Horton agreed to follow the recommendations documented in the above note.  ARRANGE: Grace Horton was  educated on the importance of frequent visits to treat obesity as outlined per CMS and USPSTF guidelines and agreed to schedule her next follow up appointment today.

## 2017-12-13 ENCOUNTER — Other Ambulatory Visit (INDEPENDENT_AMBULATORY_CARE_PROVIDER_SITE_OTHER): Payer: Self-pay | Admitting: Family Medicine

## 2017-12-13 DIAGNOSIS — R7303 Prediabetes: Secondary | ICD-10-CM

## 2017-12-28 ENCOUNTER — Other Ambulatory Visit (INDEPENDENT_AMBULATORY_CARE_PROVIDER_SITE_OTHER): Payer: Self-pay | Admitting: Family Medicine

## 2017-12-28 DIAGNOSIS — J3089 Other allergic rhinitis: Secondary | ICD-10-CM

## 2017-12-31 ENCOUNTER — Ambulatory Visit (INDEPENDENT_AMBULATORY_CARE_PROVIDER_SITE_OTHER): Payer: BC Managed Care – PPO | Admitting: Family Medicine

## 2017-12-31 VITALS — BP 109/66 | HR 68 | Temp 98.0°F | Ht 62.0 in | Wt 192.0 lb

## 2017-12-31 DIAGNOSIS — Z9189 Other specified personal risk factors, not elsewhere classified: Secondary | ICD-10-CM

## 2017-12-31 DIAGNOSIS — E559 Vitamin D deficiency, unspecified: Secondary | ICD-10-CM | POA: Diagnosis not present

## 2017-12-31 DIAGNOSIS — Z6835 Body mass index (BMI) 35.0-35.9, adult: Secondary | ICD-10-CM

## 2017-12-31 DIAGNOSIS — F3289 Other specified depressive episodes: Secondary | ICD-10-CM

## 2017-12-31 MED ORDER — BUPROPION HCL ER (SR) 150 MG PO TB12: 150.0000 mg | ORAL_TABLET | Freq: Every day | ORAL | 0 refills | Status: DC

## 2017-12-31 MED ORDER — VITAMIN D (ERGOCALCIFEROL) 1.25 MG (50000 UNIT) PO CAPS: 50000.0000 [IU] | ORAL_CAPSULE | ORAL | 0 refills | Status: DC

## 2018-01-04 ENCOUNTER — Encounter (INDEPENDENT_AMBULATORY_CARE_PROVIDER_SITE_OTHER): Payer: Self-pay | Admitting: Family Medicine

## 2018-01-05 ENCOUNTER — Other Ambulatory Visit (INDEPENDENT_AMBULATORY_CARE_PROVIDER_SITE_OTHER): Payer: Self-pay | Admitting: Bariatrics

## 2018-01-05 DIAGNOSIS — Z6835 Body mass index (BMI) 35.0-35.9, adult: Principal | ICD-10-CM

## 2018-01-05 MED ORDER — LORCASERIN HCL 10 MG PO TABS: 1.0000 | ORAL_TABLET | Freq: Two times a day (BID) | ORAL | 0 refills | Status: DC

## 2018-01-05 NOTE — Progress Notes (Signed)
Office: [phone]  /  Fax: [phone]   HPI:   Chief Complaint: OBESITY Grace Horton is here to discuss her progress with her obesity treatment plan. She is on the keep a food journal with 1000-1200 calories and 70+ grams of protein daily and is following her eating plan approximately 90 % of the time. She states she is exercising 0 minutes 0 times per week. Grace Horton has done well maintaining weight. She is getting ready to travel over Thanksgiving.  Her weight is 192 lb (87.1 kg) today and has not lost weight since her last visit. She has lost 27 lbs since starting treatment with [name].  Vitamin D Deficiency Grace Horton has a diagnosis of vitamin D deficiency. She is stable on prescription Vit D and denies nausea, vomiting or muscle weakness.  At risk for osteopenia and osteoporosis Grace Horton is at higher risk of osteopenia and osteoporosis due to vitamin D deficiency.   Depression with emotional eating behaviors Grace Horton states her mood has greatly improved after restarting Wellbutrin. Grace Horton struggles with emotional eating and using food for comfort to the extent that it is negatively impacting her health. She often snacks when she is not hungry. Grace Horton sometimes feels she is out of control and then feels guilty that she made poor food choices. She has been working on behavior modification techniques to help reduce her emotional eating and has been somewhat successful. She shows no sign of suicidal or homicidal ideations.  Depression screen PHQ 2/9 06/25/2016  Decreased Interest 3  Down, Depressed, Hopeless 1  PHQ - 2 Score 4  Altered sleeping 0  Tired, decreased energy 3  Change in appetite 2  Feeling bad or failure about yourself  1  Trouble concentrating 1  Moving slowly or fidgety/restless 3  Suicidal thoughts 0  PHQ-9 Score 14    ALLERGIES: No Known Allergies  MEDICATIONS: Current Outpatient Medications on File Prior to Visit  Medication Sig Dispense Refill  . Diclofenac Sodium  (PENNSAID) 2 % SOLN Fingertip amount to affected area twice daily 1 Bottle 2  . fluticasone (FLONASE) 50 MCG/ACT nasal spray 2 sprays in each nostril daily 16 g 0  . levonorgestrel-ethinyl estradiol (AVIANE,ALESSE,LESSINA) 0.1-20 MG-MCG tablet Take 1 tablet by mouth daily.    . metFORMIN (GLUCOPHAGE) 500 MG tablet TAKE 1 TABLET (500 MG TOTAL) BY MOUTH 2 (TWO) TIMES DAILY WITH A MEAL. 60 tablet 0   No current facility-administered medications on file prior to visit.     PAST MEDICAL HISTORY: Past Medical History:  Diagnosis Date  . AC (acromioclavicular) joint bone spurs   . Anxiety   . Back pain   . Chondromalacia   . Depression   . GERD (gastroesophageal reflux disease)   . History of shingles 2011   Around Left Eye  . Joint pain   . Raynaud disease   . Swelling    in feet or legs  . Vitamin D deficiency     PAST SURGICAL HISTORY: Past Surgical History:  Procedure Laterality Date  . CESAREAN SECTION    . SHOULDER SURGERY Left 2007  . TONSILLECTOMY      SOCIAL HISTORY: Social History   Tobacco Use  . Smoking status: Never Smoker  . Smokeless tobacco: Never Used  Substance Use Topics  . Alcohol use: No  . Drug use: No    FAMILY HISTORY: Family History  Problem Relation Age of Onset  . Heart disease Father   . Diabetes Father   . Kidney disease Father  Stage III   . Transient ischemic attack Father        S/P Carotid endarterectomy  . Hypertension Father   . Hyperlipidemia Father   . Irritable bowel syndrome Mother   . Hypertension Mother   . Hyperlipidemia Mother   . Obesity Mother     ROS: Review of Systems  Constitutional: Negative for weight loss.  Gastrointestinal: Negative for nausea and vomiting.  Musculoskeletal:       Negative muscle weakness  Psychiatric/Behavioral: Positive for depression. Negative for suicidal ideas.    PHYSICAL EXAM: Blood pressure 109/66, pulse 68, temperature 98 F (36.7 C), temperature source Oral, height 5'  2" (1.575 m), weight 192 lb (87.1 kg), SpO2 98 %. Body mass index is 35.12 kg/m. Physical Exam  Constitutional: She is oriented to person, place, and time. She appears well-developed and well-nourished.  Cardiovascular: Normal rate.  Pulmonary/Chest: Effort normal.  Musculoskeletal: Normal range of motion.  Neurological: She is oriented to person, place, and time.  Skin: Skin is warm and dry.  Psychiatric: She has a normal mood and affect. Her behavior is normal.  Vitals reviewed.   RECENT LABS AND TESTS: BMET    Component Value Date/Time   NA 142 11/17/2017 1132   K 4.7 11/17/2017 1132   CL 105 11/17/2017 1132   CO2 24 11/17/2017 1132   GLUCOSE 87 11/17/2017 1132   GLUCOSE 83 05/27/2012 1119   BUN 13 11/17/2017 1132   CREATININE 0.66 11/17/2017 1132   CREATININE 0.81 05/27/2012 1119   CALCIUM 8.7 11/17/2017 1132   GFRNONAA 104 11/17/2017 1132   GFRAA 120 11/17/2017 1132   Lab Results  Component Value Date   HGBA1C 5.2 11/17/2017   HGBA1C 5.1 06/11/2017   HGBA1C 5.5 02/17/2017   HGBA1C 5.3 10/08/2016   HGBA1C 5.7 (H) 06/25/2016   Lab Results  Component Value Date   INSULIN 8.8 11/17/2017   INSULIN 6.5 06/11/2017   INSULIN 12.3 02/17/2017   INSULIN 9.7 10/08/2016   INSULIN 12.7 06/25/2016   CBC    Component Value Date/Time   WBC 6.0 11/17/2017 1132   WBC 6.7 05/27/2012 1119   RBC 4.12 11/17/2017 1132   RBC 4.46 05/27/2012 1119   HGB 12.8 11/17/2017 1132   HCT 37.7 11/17/2017 1132   PLT 317 05/27/2012 1119   MCV 92 11/17/2017 1132   MCH 31.1 11/17/2017 1132   MCH 30.7 05/27/2012 1119   MCHC 34.0 11/17/2017 1132   MCHC 33.7 05/27/2012 1119   RDW 12.2 (L) 11/17/2017 1132   LYMPHSABS 1.9 11/17/2017 1132   MONOABS 0.4 05/27/2012 1119   EOSABS 0.1 11/17/2017 1132   BASOSABS 0.0 11/17/2017 1132   Iron/TIBC/Ferritin/ %Sat No results found for: IRON, TIBC, FERRITIN, IRONPCTSAT Lipid Panel     Component Value Date/Time   CHOL 161 11/17/2017 1132   TRIG  50 11/17/2017 1132   HDL 41 11/17/2017 1132   LDLCALC 110 (H) 11/17/2017 1132   Hepatic Function Panel     Component Value Date/Time   PROT 5.8 (L) 11/17/2017 1132   ALBUMIN 3.9 11/17/2017 1132   AST 10 11/17/2017 1132   ALT 9 11/17/2017 1132   ALKPHOS 60 11/17/2017 1132   BILITOT 0.3 11/17/2017 1132      Component Value Date/Time   TSH 1.500 10/08/2016 1025   TSH 1.630 06/25/2016 1053  Results for PHILIPPA, VESSEY (MRN [id number]) as of 01/05/2018 12:44  Ref. Range 11/17/2017 11:32  Vitamin D, 25-Hydroxy Latest Ref Range: 30.0 - 100.0 ng/mL  60.7    ASSESSMENT AND PLAN: Vitamin D deficiency - Plan: Vitamin D, Ergocalciferol, (DRISDOL) 1.25 MG (50000 UT) CAPS capsule  Other depression - with emotional eating - Plan: buPROPion (WELLBUTRIN SR) 150 MG 12 hr tablet  At risk for osteoporosis  Class 2 severe obesity with serious comorbidity and body mass index (BMI) of 35.0 to 35.9 in adult, unspecified obesity type ([place])  PLAN:  Vitamin D Deficiency Grace Horton was informed that low vitamin D levels contributes to fatigue and are associated with obesity, breast, and colon cancer. Grace Horton agrees to continue taking prescription Vit D @50 ,000 IU every 14 days #2 and we will refill for 1 month. She will follow up for routine testing of vitamin D, at least 2-3 times per year. She was informed of the risk of over-replacement of vitamin D and agrees to not increase her dose unless she discusses this with [name] first. Grace Horton agrees to follow up with our clinic in 3 to 4 weeks.  At risk for osteopenia and osteoporosis Grace Horton was given extended (15 minutes) osteoporosis prevention counseling today. Grace Horton is at risk for osteopenia and osteoporsis due to her vitamin D deficiency. She was encouraged to take her vitamin D and follow her higher calcium diet and increase strengthening exercise to help strengthen her bones and decrease her risk of osteopenia and osteoporosis.  Depression with Emotional Eating  Behaviors We discussed behavior modification techniques today to help Grace Horton deal with her emotional eating and depression. Grace Horton agrees to continue taking Wellbutrin SR 150 mg q AM #30 and we will refill for 1 month. Grace Horton agrees to follow up with our clinic in 3 to 4 weeks.  Obesity Grace Horton is currently in the action stage of change. As such, her goal is to continue with weight loss efforts She has agreed to keep a food journal with 1000-1200 calories and 70+ grams of protein daily Grace Horton has been instructed to work up to a goal of 150 minutes of combined cardio and strengthening exercise per week for weight loss and overall health benefits. We discussed the following Behavioral Modification Strategies today: increasing lean protein intake, decreasing simple carbohydrates, work on meal planning and easy cooking plans, holiday eating strategies, and travel eating strategies We discussed various medication options to help Grace Horton with her weight loss efforts and we both agreed to continue taking Belviq 10 mg BID #60 and we will refill for 1 month.  Grace Horton has agreed to follow up with our clinic in 3 to 4 weeks. She was informed of the importance of frequent follow up visits to maximize her success with intensive lifestyle modifications for her multiple health conditions.   OBESITY BEHAVIORAL INTERVENTION VISIT  Today's visit was # 30   Starting weight: 219 lbs Starting date: 06/25/16 Today's weight : 192 lbs  Today's date: 12/31/2017 Total lbs lost to date: 46    ASK: We discussed the diagnosis of obesity with Grace Horton today and Grace Horton agreed to give [name] permission to discuss obesity behavioral modification therapy today.  ASSESS: Evangelynn has the diagnosis of obesity and her BMI today is 35.11 Britainy is in the action stage of change   ADVISE: Ramaya was educated on the multiple health risks of obesity as well as the benefit of weight loss to improve her health. She was advised of  the need for long term treatment and the importance of lifestyle modifications to improve her current health and to decrease her risk of future health problems.  AGREE: Multiple dietary modification  options and treatment options were discussed and  Saleema agreed to follow the recommendations documented in the above note.  ARRANGE: Aala was educated on the importance of frequent visits to treat obesity as outlined per CMS and USPSTF guidelines and agreed to schedule her next follow up appointment today.  I, Trixie Dredge, am acting as transcriptionist for Dennard Nip, MD  I have reviewed the above documentation for accuracy and completeness, and I agree with the above. -Dennard Nip, MD

## 2018-01-14 ENCOUNTER — Encounter (INDEPENDENT_AMBULATORY_CARE_PROVIDER_SITE_OTHER): Payer: Self-pay | Admitting: Family Medicine

## 2018-01-25 ENCOUNTER — Encounter (INDEPENDENT_AMBULATORY_CARE_PROVIDER_SITE_OTHER): Payer: Self-pay | Admitting: Family Medicine

## 2018-01-25 ENCOUNTER — Ambulatory Visit (INDEPENDENT_AMBULATORY_CARE_PROVIDER_SITE_OTHER): Payer: BC Managed Care – PPO | Admitting: Family Medicine

## 2018-01-25 VITALS — BP 108/73 | HR 67 | Temp 98.0°F | Ht 62.0 in | Wt 194.0 lb

## 2018-01-25 DIAGNOSIS — F3289 Other specified depressive episodes: Secondary | ICD-10-CM | POA: Diagnosis not present

## 2018-01-25 DIAGNOSIS — Z9189 Other specified personal risk factors, not elsewhere classified: Secondary | ICD-10-CM

## 2018-01-25 DIAGNOSIS — Z6835 Body mass index (BMI) 35.0-35.9, adult: Secondary | ICD-10-CM

## 2018-01-25 DIAGNOSIS — E66812 Obesity, class 2: Secondary | ICD-10-CM

## 2018-01-25 MED ORDER — LORCASERIN HCL 10 MG PO TABS: 1.0000 | ORAL_TABLET | Freq: Two times a day (BID) | ORAL | 0 refills | Status: DC

## 2018-01-25 MED ORDER — BUPROPION HCL ER (SR) 150 MG PO TB12: 150.0000 mg | ORAL_TABLET | Freq: Every day | ORAL | 0 refills | Status: DC

## 2018-01-26 NOTE — Progress Notes (Signed)
Office: [phone]  /  Fax: [phone]   HPI:   Chief Complaint: OBESITY Grace Horton is here to discuss her progress with her obesity treatment plan. She is keeping a food journal with 1000 to 1200 calories and 70+ grams of protein and is following her eating plan approximately 50 % of the time. She states she is exercising 0 minutes 0 times per week. Grace Horton notes increased temptations over the last month with increased celebration eating. She notes feeling more bloated and is ready to get back on track.  Her weight is 194 lb (88 kg) today and has had a weight gain of 2 pounds over a period of 4 weeks since her last visit. She has lost 25 lbs since starting treatment with [name].  Depression with emotional eating behaviors Grace Horton is struggling with emotional eating and using food for comfort to the extent that it is negatively impacting her health. She often snacks when she is not hungry. Grace Horton sometimes feels she is out of control and then feels guilty that she made poor food choices. She has been working on behavior modification techniques to help reduce her emotional eating and has been somewhat successful. Her mood is stable on Wellbutrin with decreased cravings, but she is struggling with increased holiday temptations and emotional eating. She shows no sign of suicidal or homicidal ideations.  At risk for cardiovascular disease Grace Horton is at a higher than average risk for cardiovascular disease due to obesity. She currently denies any chest pain.  ASSESSMENT AND PLAN:  Other depression - with emotional eating - Plan: buPROPion (WELLBUTRIN SR) 150 MG 12 hr tablet  At risk for heart disease  Class 2 severe obesity with serious comorbidity and body mass index (BMI) of 35.0 to 35.9 in adult, unspecified obesity type (Grace Horton) - Plan: Lorcaserin HCl (BELVIQ) 10 MG TABS  PLAN:  Depression with Emotional Eating Behaviors We discussed behavior modification techniques today to help Valor deal with  her emotional eating and depression. She has agreed to take Wellbutrin SR 150mg  qd with no refills and agreed to follow up as directed in 3 to 4 weeks.   Cardiovascular risk counseling Grace Horton was given extended (15 minutes) coronary artery disease prevention counseling today. She is 47 y.o. female and has risk factors for heart disease including obesity. We discussed intensive lifestyle modifications today with an emphasis on specific weight loss instructions and strategies. Pt was also informed of the importance of increasing exercise and decreasing saturated fats to help prevent heart disease.  Obesity Grace Horton is currently in the action stage of change. As such, her goal is to continue with weight loss efforts. She has agreed to keep a food journal with 1000 to 1200 calories and 70+ grams of protein.  Grace Horton has been instructed to work up to a goal of 150 minutes of combined cardio and strengthening exercise per week for weight loss and overall health benefits. We discussed the following Behavioral Modification Strategies today: increasing vegetables, work on meal planning and easy cooking plans, holiday eating strategies, and celebration eating strategies. We discussed various medication options to help Grace Horton with her weight loss efforts and we both agreed to continue Belviq 10mg  BID #60 with no refills and will follow up as directed.  Grace Horton has agreed to follow up with our clinic in 3 to 4 weeks. She was informed of the importance of frequent follow up visits to maximize her success with intensive lifestyle modifications for her multiple health conditions.  ALLERGIES: No Known Allergies  MEDICATIONS: Current Outpatient Medications on File Prior to Visit  Medication Sig Dispense Refill  . Diclofenac Sodium (PENNSAID) 2 % SOLN Fingertip amount to affected area twice daily 1 Bottle 2  . fluticasone (FLONASE) 50 MCG/ACT nasal spray 2 sprays in each nostril daily 16 g 0  .  levonorgestrel-ethinyl estradiol (AVIANE,ALESSE,LESSINA) 0.1-20 MG-MCG tablet Take 1 tablet by mouth daily.    . metFORMIN (GLUCOPHAGE) 500 MG tablet TAKE 1 TABLET (500 MG TOTAL) BY MOUTH 2 (TWO) TIMES DAILY WITH A MEAL. 60 tablet 0  . Vitamin D, Ergocalciferol, (DRISDOL) 1.25 MG (50000 UT) CAPS capsule Take 1 capsule (50,000 Units total) by mouth every 14 (fourteen) days. 4 capsule 0   No current facility-administered medications on file prior to visit.     PAST MEDICAL HISTORY: Past Medical History:  Diagnosis Date  . AC (acromioclavicular) joint bone spurs   . Anxiety   . Back pain   . Chondromalacia   . Depression   . GERD (gastroesophageal reflux disease)   . History of shingles 2011   Around Left Eye  . Joint pain   . Raynaud disease   . Swelling    in feet or legs  . Vitamin D deficiency     PAST SURGICAL HISTORY: Past Surgical History:  Procedure Laterality Date  . CESAREAN SECTION    . SHOULDER SURGERY Left 2007  . TONSILLECTOMY      SOCIAL HISTORY: Social History   Tobacco Use  . Smoking status: Never Smoker  . Smokeless tobacco: Never Used  Substance Use Topics  . Alcohol use: No  . Drug use: No    FAMILY HISTORY: Family History  Problem Relation Age of Onset  . Heart disease Father   . Diabetes Father   . Kidney disease Father        Stage III   . Transient ischemic attack Father        S/P Carotid endarterectomy  . Hypertension Father   . Hyperlipidemia Father   . Irritable bowel syndrome Mother   . Hypertension Mother   . Hyperlipidemia Mother   . Obesity Mother     ROS: Review of Systems  Cardiovascular: Negative for chest pain.  Psychiatric/Behavioral: Positive for depression. Negative for suicidal ideas.       Negative for homicidal ideations.   PHYSICAL EXAM: Blood pressure 108/73, pulse 67, temperature 98 F (36.7 C), temperature source Oral, height 5\' 2"  (1.575 m), weight 194 lb (88 kg), SpO2 98 %. Body mass index is 35.48  kg/m. Physical Exam Vitals signs reviewed.  Constitutional:      Appearance: Normal appearance. She is obese.  Cardiovascular:     Rate and Rhythm: Normal rate.  Pulmonary:     Effort: Pulmonary effort is normal.  Musculoskeletal: Normal range of motion.  Skin:    General: Skin is warm and dry.  Neurological:     Mental Status: She is alert and oriented to person, place, and time.  Psychiatric:        Mood and Affect: Mood normal.        Behavior: Behavior normal.    RECENT LABS AND TESTS: BMET    Component Value Date/Time   NA 142 11/17/2017 1132   K 4.7 11/17/2017 1132   CL 105 11/17/2017 1132   CO2 24 11/17/2017 1132   GLUCOSE 87 11/17/2017 1132   GLUCOSE 83 05/27/2012 1119   BUN 13 11/17/2017 1132   CREATININE 0.66 11/17/2017 1132   CREATININE 0.81 05/27/2012  1119   CALCIUM 8.7 11/17/2017 1132   GFRNONAA 104 11/17/2017 1132   GFRAA 120 11/17/2017 1132   Lab Results  Component Value Date   HGBA1C 5.2 11/17/2017   HGBA1C 5.1 06/11/2017   HGBA1C 5.5 02/17/2017   HGBA1C 5.3 10/08/2016   HGBA1C 5.7 (H) 06/25/2016   Lab Results  Component Value Date   INSULIN 8.8 11/17/2017   INSULIN 6.5 06/11/2017   INSULIN 12.3 02/17/2017   INSULIN 9.7 10/08/2016   INSULIN 12.7 06/25/2016   CBC    Component Value Date/Time   WBC 6.0 11/17/2017 1132   WBC 6.7 05/27/2012 1119   RBC 4.12 11/17/2017 1132   RBC 4.46 05/27/2012 1119   HGB 12.8 11/17/2017 1132   HCT 37.7 11/17/2017 1132   PLT 317 05/27/2012 1119   MCV 92 11/17/2017 1132   MCH 31.1 11/17/2017 1132   MCH 30.7 05/27/2012 1119   MCHC 34.0 11/17/2017 1132   MCHC 33.7 05/27/2012 1119   RDW 12.2 (L) 11/17/2017 1132   LYMPHSABS 1.9 11/17/2017 1132   MONOABS 0.4 05/27/2012 1119   EOSABS 0.1 11/17/2017 1132   BASOSABS 0.0 11/17/2017 1132   Iron/TIBC/Ferritin/ %Sat No results found for: IRON, TIBC, FERRITIN, IRONPCTSAT Lipid Panel     Component Value Date/Time   CHOL 161 11/17/2017 1132   TRIG 50  11/17/2017 1132   HDL 41 11/17/2017 1132   LDLCALC 110 (H) 11/17/2017 1132   Hepatic Function Panel     Component Value Date/Time   PROT 5.8 (L) 11/17/2017 1132   ALBUMIN 3.9 11/17/2017 1132   AST 10 11/17/2017 1132   ALT 9 11/17/2017 1132   ALKPHOS 60 11/17/2017 1132   BILITOT 0.3 11/17/2017 1132      Component Value Date/Time   TSH 1.500 10/08/2016 1025   TSH 1.630 06/25/2016 1053   Results for KIMIYE, STRATHMAN (MRN [id number]) as of 01/26/2018 14:58  Ref. Range 11/17/2017 11:32  Vitamin D, 25-Hydroxy Latest Ref Range: 30.0 - 100.0 ng/mL 60.7    OBESITY BEHAVIORAL INTERVENTION VISIT  Today's visit was # 31   Starting weight: 219 lbs Starting date: 06/25/16 Today's weight : Weight: 194 lb (88 kg)  Today's date: 01/25/2018 Total lbs lost to date: 25  ASK: We discussed the diagnosis of obesity with Alben Spittle today and Lourine agreed to give [name] permission to discuss obesity behavioral modification therapy today.  ASSESS: Alicianna has the diagnosis of obesity and her BMI today is 35.4. Tequilla is in the action stage of change.   ADVISE: Jordyne was educated on the multiple health risks of obesity as well as the benefit of weight loss to improve her health. She was advised of the need for long term treatment and the importance of lifestyle modifications to improve her current health and to decrease her risk of future health problems.  AGREE: Multiple dietary modification options and treatment options were discussed and Alizae agreed to follow the recommendations documented in the above note.  ARRANGE: Amarra was educated on the importance of frequent visits to treat obesity as outlined per CMS and USPSTF guidelines and agreed to schedule her next follow up appointment today.  I, Marcille Blanco, am acting as transcriptionist for Starlyn Skeans, MD  I have reviewed the above documentation for accuracy and completeness, and I agree with the above. -Dennard Nip, MD

## 2018-02-23 ENCOUNTER — Ambulatory Visit (INDEPENDENT_AMBULATORY_CARE_PROVIDER_SITE_OTHER): Payer: BC Managed Care – PPO | Admitting: Family Medicine

## 2018-02-23 ENCOUNTER — Encounter (INDEPENDENT_AMBULATORY_CARE_PROVIDER_SITE_OTHER): Payer: Self-pay | Admitting: Family Medicine

## 2018-02-23 VITALS — BP 110/74 | HR 67 | Temp 98.0°F | Ht 62.0 in | Wt 197.0 lb

## 2018-02-23 DIAGNOSIS — Z9189 Other specified personal risk factors, not elsewhere classified: Secondary | ICD-10-CM

## 2018-02-23 DIAGNOSIS — Z6835 Body mass index (BMI) 35.0-35.9, adult: Secondary | ICD-10-CM

## 2018-02-23 DIAGNOSIS — F3289 Other specified depressive episodes: Secondary | ICD-10-CM | POA: Diagnosis not present

## 2018-02-23 DIAGNOSIS — E559 Vitamin D deficiency, unspecified: Secondary | ICD-10-CM | POA: Diagnosis not present

## 2018-02-23 DIAGNOSIS — R7303 Prediabetes: Secondary | ICD-10-CM

## 2018-02-23 DIAGNOSIS — Z6836 Body mass index (BMI) 36.0-36.9, adult: Secondary | ICD-10-CM

## 2018-02-23 MED ORDER — VITAMIN D (ERGOCALCIFEROL) 1.25 MG (50000 UNIT) PO CAPS: 50000.0000 [IU] | ORAL_CAPSULE | ORAL | 0 refills | Status: DC

## 2018-02-23 MED ORDER — BUPROPION HCL ER (SR) 150 MG PO TB12: 150.0000 mg | ORAL_TABLET | Freq: Every day | ORAL | 0 refills | Status: DC

## 2018-02-23 MED ORDER — METFORMIN HCL 500 MG PO TABS: 500.0000 mg | ORAL_TABLET | Freq: Two times a day (BID) | ORAL | 0 refills | Status: DC

## 2018-02-23 MED ORDER — LORCASERIN HCL 10 MG PO TABS: 1.0000 | ORAL_TABLET | Freq: Two times a day (BID) | ORAL | 0 refills | Status: DC

## 2018-02-24 NOTE — Progress Notes (Signed)
Office: [phone]  /  Fax: [phone]   HPI:   Chief Complaint: OBESITY Grace Horton is here to discuss her progress with her obesity treatment plan. She is on the keep a food journal with 1000-1200 calories and 70+ grams of protein daily and is following her eating plan approximately 50 % of the time. She states she is bike riding for 30-50 minutes 3 times per week. Saul was mindful about eating over the holidays, but indulged somewhat. She has gotten back on track in the last 2 weeks with journaling. She started back to riding her stationary bike.  Her weight is 197 lb (89.4 kg) today and has gained 3 pounds since her last visit. She has lost 22 lbs since starting treatment with [name].  Pre-Diabetes [name] has a diagnosis of pre-diabetes based on her elevated Hgb A1c and was informed this puts her at greater risk of developing diabetes. She is stable on metformin and denies nausea, vomiting, or hypoglycemia. She is working on diet and exercise to decrease risk of diabetes.   At risk for diabetes [name] is at higher than average risk for developing diabetes due to her obesity and pre-diabetes. She currently denies polyuria or polydipsia.  Vitamin D Deficiency [name] has a diagnosis of vitamin D deficiency. She is stable on prescription Vit D, but level is not yet at goal. She denies nausea, vomiting or muscle weakness.  Depression with emotional eating behaviors [name] 's mood is stable on Wellbutrin and Belviq. Her blood pressure is stable and she denies insomnia. [name] struggles with emotional eating and using food for comfort to the extent that it is negatively impacting her health. She often snacks when she is not hungry. [name] sometimes feels she is out of control and then feels guilty that she made poor food choices. She has been working on behavior modification techniques to help reduce her emotional eating and has been somewhat successful. She shows no sign of suicidal or homicidal  ideations.  Depression screen [facility] 2/9 06/25/2016  Decreased Interest 3  Down, Depressed, Hopeless 1  PHQ - 2 Score 4  Altered sleeping 0  Tired, decreased energy 3  Change in appetite 2  Feeling bad or failure about yourself  1  Trouble concentrating 1  Moving slowly or fidgety/restless 3  Suicidal thoughts 0  PHQ-9 Score 14    ASSESSMENT AND PLAN:  Prediabetes - Plan: metFORMIN (GLUCOPHAGE) 500 MG tablet  Vitamin D deficiency - Plan: Vitamin D, Ergocalciferol, (DRISDOL) 1.25 MG (50000 UT) CAPS capsule  Other depression - with emotional eating - Plan: buPROPion (WELLBUTRIN SR) 150 MG 12 hr tablet  At risk for diabetes mellitus  Class 2 severe obesity with serious comorbidity and body mass index (BMI) of 36.0 to 36.9 in adult, unspecified obesity type ([facility])  Class 2 severe obesity with serious comorbidity and body mass index (BMI) of 35.0 to 35.9 in adult, unspecified obesity type ([place]) - Plan: Lorcaserin HCl (BELVIQ) 10 MG TABS  PLAN:  Pre-Diabetes [name] will continue to work on weight loss, diet, exercise, and decreasing simple carbohydrates in her diet to help decrease the risk of diabetes. We dicussed metformin including benefits and risks. She was informed that eating too many simple carbohydrates or too many calories at one sitting increases the likelihood of GI side effects. [name] agrees to continue taking metformin 500 mg BID #60 and we will refill for 1 month. [name] agrees to follow up with our clinic in 3 to 4 weeks as directed to  monitor her progress.  Diabetes risk counselling [name] was given extended (15 minutes) diabetes prevention counseling today. She is 48 y.o. female and has risk factors for diabetes including obesity and pre-diabetes. We discussed intensive lifestyle modifications today with an emphasis on weight loss as well as increasing exercise and decreasing simple carbohydrates in her diet.  Vitamin D Deficiency [name] was informed that low vitamin D  levels contributes to fatigue and are associated with obesity, breast, and colon cancer. [name] agrees to continue taking prescription Vit D @50 ,000 IU every 14 days #4 and we will refill for 1 month. She will follow up for routine testing of vitamin D, at least 2-3 times per year. She was informed of the risk of over-replacement of vitamin D and agrees to not increase her dose unless she discusses this with [name] first. [name] agrees to follow up with our clinic in 3 to 4 weeks.  Depression with Emotional Eating Behaviors We discussed behavior modification techniques today to help Tylesha deal with her emotional eating and depression. Pearly agrees to continue taking Wellbutrin SR 150 mg q AM #30 and we will refill for 1 month. [name] agrees to follow up with our clinic in 3 to 4 weeks.  Obesity [name] is currently in the action stage of change. As such, her goal is to continue with weight loss efforts She has agreed to keep a food journal with 1000-1200 calories and 70+ grams of protein daily [name] has been instructed to work up to a goal of 150 minutes of combined cardio and strengthening exercise per week or as is for weight loss and overall health benefits. We discussed the following Behavioral Modification Strategies today: increasing lean protein intake, decreasing simple carbohydrates , work on meal planning and easy cooking plans and emotional eating strategies We discussed various medication options to help [name] with her weight loss efforts and we both agreed to continue Belviq 10 mg 1 tablet BID #60 and we will refill for 1 month.   [name] has agreed to follow up with our clinic in 3 to 4 weeks. She was informed of the importance of frequent follow up visits to maximize her success with intensive lifestyle modifications for her multiple health conditions.  ALLERGIES: No Known Allergies  MEDICATIONS: Current Outpatient Medications on File Prior to Visit  Medication Sig Dispense Refill    . Diclofenac Sodium (PENNSAID) 2 % SOLN Fingertip amount to affected area twice daily 1 Bottle 2  . fluticasone (FLONASE) 50 MCG/ACT nasal spray 2 sprays in each nostril daily 16 g 0  . levonorgestrel-ethinyl estradiol (AVIANE,ALESSE,LESSINA) 0.1-20 MG-MCG tablet Take 1 tablet by mouth daily.     No current facility-administered medications on file prior to visit.     PAST MEDICAL HISTORY: Past Medical History:  Diagnosis Date  . AC (acromioclavicular) joint bone spurs   . Anxiety   . Back pain   . Chondromalacia   . Depression   . GERD (gastroesophageal reflux disease)   . History of shingles 2011   Around Left Eye  . Joint pain   . Raynaud disease   . Swelling    in feet or legs  . Vitamin D deficiency     PAST SURGICAL HISTORY: Past Surgical History:  Procedure Laterality Date  . CESAREAN SECTION    . SHOULDER SURGERY Left 2007  . TONSILLECTOMY      SOCIAL HISTORY: Social History   Tobacco Use  . Smoking status: Never Smoker  . Smokeless tobacco: Never Used  Substance Use Topics  . Alcohol use: No  . Drug use: No    FAMILY HISTORY: Family History  Problem Relation Age of Onset  . Heart disease Father   . Diabetes Father   . Kidney disease Father        Stage III   . Transient ischemic attack Father        S/P Carotid endarterectomy  . Hypertension Father   . Hyperlipidemia Father   . Irritable bowel syndrome Mother   . Hypertension Mother   . Hyperlipidemia Mother   . Obesity Mother     ROS: Review of Systems  Constitutional: Negative for weight loss.  Gastrointestinal: Negative for nausea and vomiting.  Genitourinary: Negative for frequency.  Musculoskeletal:       Negative muscle weakness  Endo/Heme/Allergies: Negative for polydipsia.       Negative hypoglycemia  Psychiatric/Behavioral: Positive for depression. Negative for suicidal ideas. The patient does not have insomnia.     PHYSICAL EXAM: Blood pressure 110/74, pulse 67,  temperature 98 F (36.7 C), temperature source Oral, height 5\' 2"  (1.575 m), weight 197 lb (89.4 kg), SpO2 98 %. Body mass index is 36.03 kg/m. Physical Exam Vitals signs reviewed.  Constitutional:      Appearance: Normal appearance. She is obese.  Cardiovascular:     Rate and Rhythm: Normal rate.     Pulses: Normal pulses.  Pulmonary:     Effort: Pulmonary effort is normal.     Breath sounds: Normal breath sounds.  Musculoskeletal: Normal range of motion.  Skin:    General: Skin is warm and dry.  Neurological:     Mental Status: She is alert and oriented to person, place, and time.  Psychiatric:        Mood and Affect: Mood normal.        Behavior: Behavior normal.     RECENT LABS AND TESTS: BMET    Component Value Date/Time   NA 142 11/17/2017 1132   K 4.7 11/17/2017 1132   CL 105 11/17/2017 1132   CO2 24 11/17/2017 1132   GLUCOSE 87 11/17/2017 1132   GLUCOSE 83 05/27/2012 1119   BUN 13 11/17/2017 1132   CREATININE 0.66 11/17/2017 1132   CREATININE 0.81 05/27/2012 1119   CALCIUM 8.7 11/17/2017 1132   GFRNONAA 104 11/17/2017 1132   GFRAA 120 11/17/2017 1132   Lab Results  Component Value Date   HGBA1C 5.2 11/17/2017   HGBA1C 5.1 06/11/2017   HGBA1C 5.5 02/17/2017   HGBA1C 5.3 10/08/2016   HGBA1C 5.7 (H) 06/25/2016   Lab Results  Component Value Date   INSULIN 8.8 11/17/2017   INSULIN 6.5 06/11/2017   INSULIN 12.3 02/17/2017   INSULIN 9.7 10/08/2016   INSULIN 12.7 06/25/2016   CBC    Component Value Date/Time   WBC 6.0 11/17/2017 1132   WBC 6.7 05/27/2012 1119   RBC 4.12 11/17/2017 1132   RBC 4.46 05/27/2012 1119   HGB 12.8 11/17/2017 1132   HCT 37.7 11/17/2017 1132   PLT 317 05/27/2012 1119   MCV 92 11/17/2017 1132   MCH 31.1 11/17/2017 1132   MCH 30.7 05/27/2012 1119   MCHC 34.0 11/17/2017 1132   MCHC 33.7 05/27/2012 1119   RDW 12.2 (L) 11/17/2017 1132   LYMPHSABS 1.9 11/17/2017 1132   MONOABS 0.4 05/27/2012 1119   EOSABS 0.1 11/17/2017  1132   BASOSABS 0.0 11/17/2017 1132   Iron/TIBC/Ferritin/ %Sat No results found for: IRON, TIBC, FERRITIN, IRONPCTSAT Lipid Panel  Component Value Date/Time   CHOL 161 11/17/2017 1132   TRIG 50 11/17/2017 1132   HDL 41 11/17/2017 1132   LDLCALC 110 (H) 11/17/2017 1132   Hepatic Function Panel     Component Value Date/Time   PROT 5.8 (L) 11/17/2017 1132   ALBUMIN 3.9 11/17/2017 1132   AST 10 11/17/2017 1132   ALT 9 11/17/2017 1132   ALKPHOS 60 11/17/2017 1132   BILITOT 0.3 11/17/2017 1132      Component Value Date/Time   TSH 1.500 10/08/2016 1025   TSH 1.630 06/25/2016 1053      OBESITY BEHAVIORAL INTERVENTION VISIT  Today's visit was # 42   Starting weight: 219 lbs Starting date: 06/25/16 Today's weight : 197 lbs  Today's date: 02/23/2018 Total lbs lost to date: 50    ASK: We discussed the diagnosis of obesity with Alben Spittle today and Karthika agreed to give [name] permission to discuss obesity behavioral modification therapy today.  ASSESS: Jagger has the diagnosis of obesity and her BMI today is 36.02 Lanyah is in the action stage of change   ADVISE: Letticia was educated on the multiple health risks of obesity as well as the benefit of weight loss to improve her health. She was advised of the need for long term treatment and the importance of lifestyle modifications to improve her current health and to decrease her risk of future health problems.  AGREE: Multiple dietary modification options and treatment options were discussed and  Odell agreed to follow the recommendations documented in the above note.  ARRANGE: Hudson was educated on the importance of frequent visits to treat obesity as outlined per CMS and USPSTF guidelines and agreed to schedule her next follow up appointment today.  I, Trixie Dredge, am acting as transcriptionist for Dennard Nip, MD  I have reviewed the above documentation for accuracy and completeness, and I agree with the above.  -Dennard Nip, MD

## 2018-03-08 ENCOUNTER — Other Ambulatory Visit (INDEPENDENT_AMBULATORY_CARE_PROVIDER_SITE_OTHER): Payer: Self-pay | Admitting: Family Medicine

## 2018-03-08 ENCOUNTER — Encounter (INDEPENDENT_AMBULATORY_CARE_PROVIDER_SITE_OTHER): Payer: Self-pay | Admitting: Family Medicine

## 2018-03-08 DIAGNOSIS — Z6835 Body mass index (BMI) 35.0-35.9, adult: Principal | ICD-10-CM

## 2018-03-09 NOTE — Telephone Encounter (Signed)
Looks like her pharmacy is messing up again? Did we call in the [place] since it is controlled?

## 2018-03-23 ENCOUNTER — Ambulatory Visit (INDEPENDENT_AMBULATORY_CARE_PROVIDER_SITE_OTHER): Payer: BC Managed Care – PPO | Admitting: Family Medicine

## 2018-03-23 ENCOUNTER — Encounter (INDEPENDENT_AMBULATORY_CARE_PROVIDER_SITE_OTHER): Payer: Self-pay | Admitting: Family Medicine

## 2018-03-23 VITALS — BP 126/78 | HR 68 | Ht 62.0 in | Wt 197.0 lb

## 2018-03-23 DIAGNOSIS — E559 Vitamin D deficiency, unspecified: Secondary | ICD-10-CM | POA: Diagnosis not present

## 2018-03-23 DIAGNOSIS — Z9189 Other specified personal risk factors, not elsewhere classified: Secondary | ICD-10-CM | POA: Diagnosis not present

## 2018-03-23 DIAGNOSIS — R7303 Prediabetes: Secondary | ICD-10-CM | POA: Diagnosis not present

## 2018-03-23 DIAGNOSIS — F3289 Other specified depressive episodes: Secondary | ICD-10-CM

## 2018-03-23 DIAGNOSIS — Z6836 Body mass index (BMI) 36.0-36.9, adult: Secondary | ICD-10-CM

## 2018-03-23 MED ORDER — BUPROPION HCL ER (SR) 150 MG PO TB12: 150.0000 mg | ORAL_TABLET | Freq: Every day | ORAL | 0 refills | Status: DC

## 2018-03-23 MED ORDER — METFORMIN HCL 500 MG PO TABS: 500.0000 mg | ORAL_TABLET | Freq: Two times a day (BID) | ORAL | 0 refills | Status: DC

## 2018-03-23 MED ORDER — LORCASERIN HCL 10 MG PO TABS: 1.0000 | ORAL_TABLET | Freq: Two times a day (BID) | ORAL | 0 refills | Status: DC

## 2018-03-23 MED ORDER — VITAMIN D (ERGOCALCIFEROL) 1.25 MG (50000 UNIT) PO CAPS: 50000.0000 [IU] | ORAL_CAPSULE | ORAL | 0 refills | Status: DC

## 2018-03-24 NOTE — Progress Notes (Signed)
Office: [phone]  /  Fax: [phone]   HPI:   Chief Complaint: OBESITY Grace Horton is here to discuss her progress with her obesity treatment plan. She is on the keep a food journal with 1000-1200 calories and 70+ grams of protein daily and is following her eating plan approximately 90 % of the time. She states she is on the recumbent bike, elliptical, lifting weights, bands, and videos for 45-60 minutes 4-5 times per week. Media has worked on increasing exercise in the last 3 weeks, both strengthening and cardio.  Her weight is 197 lb (89.4 kg) today and has not lost weight since her last visit. She has lost 22 lbs since starting treatment with [name].  Vitamin D Deficiency Grace Horton has a diagnosis of vitamin D deficiency. She is stable on prescription Vit D and denies nausea, vomiting or muscle weakness.  Pre-Diabetes Grace Horton has a diagnosis of pre-diabetes based on her elevated Hgb A1c and was informed this puts her at greater risk of developing diabetes. She is stable on metformin and she is working on diet and exercise to decrease risk of diabetes. She denies nausea or hypoglycemia.  At risk for diabetes Grace Horton is at higher than average risk for developing diabetes due to her obesity and pre-diabetes. She currently denies polyuria or polydipsia.  Depression with emotional eating behaviors Grace Horton's mood is stable. She feels more in control of emotional eating. Grace Horton struggles with emotional eating and using food for comfort to the extent that it is negatively impacting her health. She often snacks when she is not hungry. Grace Horton sometimes feels she is out of control and then feels guilty that she made poor food choices. She has been working on behavior modification techniques to help reduce her emotional eating and has been somewhat successful. She shows no sign of suicidal or homicidal ideations.  Depression screen Grace Horton 2/9 06/25/2016  Decreased Interest 3  Down, Depressed, Hopeless 1  PHQ  - 2 Score 4  Altered sleeping 0  Tired, decreased energy 3  Change in appetite 2  Feeling bad or failure about yourself  1  Trouble concentrating 1  Moving slowly or fidgety/restless 3  Suicidal thoughts 0  PHQ-9 Score 14    ASSESSMENT AND PLAN:  Vitamin D deficiency - Plan: Vitamin D, Ergocalciferol, (DRISDOL) 1.25 MG (50000 UT) CAPS capsule  Prediabetes - Plan: metFORMIN (GLUCOPHAGE) 500 MG tablet  Other depression - with emotional eating - Plan: buPROPion (WELLBUTRIN SR) 150 MG 12 hr tablet  At risk for diabetes mellitus  Class 2 severe obesity with serious comorbidity and body mass index (BMI) of 36.0 to 36.9 in adult, unspecified obesity type ([facility]) - Plan: Lorcaserin HCl (BELVIQ) 10 MG TABS  PLAN:  Vitamin D Deficiency Grace Horton was informed that low vitamin D levels contributes to fatigue and are associated with obesity, breast, and colon cancer. Grace Horton agrees to continue taking prescription Vit D @50 ,000 IU every 14 days #2 and we will refill for 1 month. She will follow up for routine testing of vitamin D, at least 2-3 times per year. She was informed of the risk of over-replacement of vitamin D and agrees to not increase her dose unless she discusses this with [name] first. Luke agrees to follow up with our clinic in 4 weeks.  Pre-Diabetes Grace Horton will continue to work on weight loss, exercise, and decreasing simple carbohydrates in her diet to help decrease the risk of diabetes. We dicussed metformin including benefits and risks. She was informed that eating too  many simple carbohydrates or too many calories at one sitting increases the likelihood of GI side effects. Grace Horton agrees to continue taking metformin 500 mg BID #60 and we will refill for 1 montih. Grace Horton agrees to follow up with our clinic in 4 weeks as directed to monitor her progress.  Diabetes risk counseling Grace Horton was given extended (15 minutes) diabetes prevention counseling today. She is 48 y.o. female and has risk  factors for diabetes including obesity and pre-diabetes. We discussed intensive lifestyle modifications today with an emphasis on weight loss as well as increasing exercise and decreasing simple carbohydrates in her diet.  Depression with Emotional Eating Behaviors We discussed behavior modification techniques today to help Grace Horton deal with her emotional eating and depression. [name] agrees to continue taking Wellbutrin SR 150 mg q AM #30 and we will refill for 1 month. [name] agrees to follow up with our clinic in 4 weeks.  Obesity Sian is currently in the action stage of change. As such, her goal is to continue with weight loss efforts She has agreed to keep a food journal with 1000-1200 calories and 70+ grams of protein daily Grace Horton has been instructed to work up to a goal of 150 minutes of combined cardio and strengthening exercise per week for weight loss and overall health benefits. We discussed the following Behavioral Modification Strategies today: increasing lean protein intake, decreasing simple carbohydrates , work on meal planning and easy cooking plans and dealing with family or coworker sabotage We discussed various medication options to help Flower with her weight loss efforts and we both agreed to continue Belviq 10 mg 1 tablet BID #60 and we will refill for 1 month.  Grace Horton has agreed to follow up with our clinic in 4 weeks. She was informed of the importance of frequent follow up visits to maximize her success with intensive lifestyle modifications for her multiple health conditions.  ALLERGIES: No Known Allergies  MEDICATIONS: Current Outpatient Medications on File Prior to Visit  Medication Sig Dispense Refill  . Diclofenac Sodium (PENNSAID) 2 % SOLN Fingertip amount to affected area twice daily 1 Bottle 2  . fluticasone (FLONASE) 50 MCG/ACT nasal spray 2 sprays in each nostril daily 16 g 0  . levonorgestrel-ethinyl estradiol (AVIANE,ALESSE,LESSINA) 0.1-20 MG-MCG tablet  Take 1 tablet by mouth daily.     No current facility-administered medications on file prior to visit.     PAST MEDICAL HISTORY: Past Medical History:  Diagnosis Date  . AC (acromioclavicular) joint bone spurs   . Anxiety   . Back pain   . Chondromalacia   . Depression   . GERD (gastroesophageal reflux disease)   . History of shingles 2011   Around Left Eye  . Joint pain   . Raynaud disease   . Swelling    in feet or legs  . Vitamin D deficiency     PAST SURGICAL HISTORY: Past Surgical History:  Procedure Laterality Date  . CESAREAN SECTION    . SHOULDER SURGERY Left 2007  . TONSILLECTOMY      SOCIAL HISTORY: Social History   Tobacco Use  . Smoking status: Never Smoker  . Smokeless tobacco: Never Used  Substance Use Topics  . Alcohol use: No  . Drug use: No    FAMILY HISTORY: Family History  Problem Relation Age of Onset  . Heart disease Father   . Diabetes Father   . Kidney disease Father        Stage III   . Transient ischemic  attack Father        S/P Carotid endarterectomy  . Hypertension Father   . Hyperlipidemia Father   . Irritable bowel syndrome Mother   . Hypertension Mother   . Hyperlipidemia Mother   . Obesity Mother     ROS: Review of Systems  Constitutional: Negative for weight loss.  Gastrointestinal: Negative for nausea and vomiting.  Genitourinary: Negative for frequency.  Musculoskeletal:       Negative muscle weakness  Endo/Heme/Allergies: Negative for polydipsia.       Negative hypoglycemia  Psychiatric/Behavioral: Positive for depression. Negative for suicidal ideas.    PHYSICAL EXAM: Blood pressure 126/78, pulse 68, height 5\' 2"  (1.575 m), weight 197 lb (89.4 kg), SpO2 98 %. Body mass index is 36.03 kg/m. Physical Exam Vitals signs reviewed.  Constitutional:      Appearance: Normal appearance. She is obese.  Cardiovascular:     Rate and Rhythm: Normal rate.     Pulses: Normal pulses.  Pulmonary:     Effort:  Pulmonary effort is normal.     Breath sounds: Normal breath sounds.  Musculoskeletal: Normal range of motion.  Skin:    General: Skin is warm and dry.  Neurological:     Mental Status: She is alert and oriented to person, place, and time.  Psychiatric:        Mood and Affect: Mood normal.        Behavior: Behavior normal.     RECENT LABS AND TESTS: BMET    Component Value Date/Time   NA 142 11/17/2017 1132   K 4.7 11/17/2017 1132   CL 105 11/17/2017 1132   CO2 24 11/17/2017 1132   GLUCOSE 87 11/17/2017 1132   GLUCOSE 83 05/27/2012 1119   BUN 13 11/17/2017 1132   CREATININE 0.66 11/17/2017 1132   CREATININE 0.81 05/27/2012 1119   CALCIUM 8.7 11/17/2017 1132   GFRNONAA 104 11/17/2017 1132   GFRAA 120 11/17/2017 1132   Lab Results  Component Value Date   HGBA1C 5.2 11/17/2017   HGBA1C 5.1 06/11/2017   HGBA1C 5.5 02/17/2017   HGBA1C 5.3 10/08/2016   HGBA1C 5.7 (H) 06/25/2016   Lab Results  Component Value Date   INSULIN 8.8 11/17/2017   INSULIN 6.5 06/11/2017   INSULIN 12.3 02/17/2017   INSULIN 9.7 10/08/2016   INSULIN 12.7 06/25/2016   CBC    Component Value Date/Time   WBC 6.0 11/17/2017 1132   WBC 6.7 05/27/2012 1119   RBC 4.12 11/17/2017 1132   RBC 4.46 05/27/2012 1119   HGB 12.8 11/17/2017 1132   HCT 37.7 11/17/2017 1132   PLT 317 05/27/2012 1119   MCV 92 11/17/2017 1132   MCH 31.1 11/17/2017 1132   MCH 30.7 05/27/2012 1119   MCHC 34.0 11/17/2017 1132   MCHC 33.7 05/27/2012 1119   RDW 12.2 (L) 11/17/2017 1132   LYMPHSABS 1.9 11/17/2017 1132   MONOABS 0.4 05/27/2012 1119   EOSABS 0.1 11/17/2017 1132   BASOSABS 0.0 11/17/2017 1132   Iron/TIBC/Ferritin/ %Sat No results found for: IRON, TIBC, FERRITIN, IRONPCTSAT Lipid Panel     Component Value Date/Time   CHOL 161 11/17/2017 1132   TRIG 50 11/17/2017 1132   HDL 41 11/17/2017 1132   LDLCALC 110 (H) 11/17/2017 1132   Hepatic Function Panel     Component Value Date/Time   PROT 5.8 (L)  11/17/2017 1132   ALBUMIN 3.9 11/17/2017 1132   AST 10 11/17/2017 1132   ALT 9 11/17/2017 1132   ALKPHOS  60 11/17/2017 1132   BILITOT 0.3 11/17/2017 1132      Component Value Date/Time   TSH 1.500 10/08/2016 1025   TSH 1.630 06/25/2016 1053      OBESITY BEHAVIORAL INTERVENTION VISIT  Today's visit was # 75   Starting weight: 219 lbs Starting date: 06/25/16 Today's weight : 197 lbs  Today's date: 03/23/2018 Total lbs lost to date: 22    Most Recent Value 04/02/2013 - 04/01/2018  Height 5\' 2"  (1.575 m) 03/23/2018  Weight 197 lb (89.4 kg) 03/23/2018  BMI (Calculated) 36.02 03/23/2018  BLOOD PRESSURE - SYSTOLIC 834 1/96/2229  BLOOD PRESSURE - DIASTOLIC 78 7/98/9211  Waist Measurement  71 inches 06/25/2016   Body Fat % 44.4 % 03/23/2018  Total Body Water (lbs) 79.8 lbs 03/23/2018  RMR 1179 04/01/2017     ASK: We discussed the diagnosis of obesity with Alben Spittle today and Jerrine agreed to give [name] permission to discuss obesity behavioral modification therapy today.  ASSESS: Felicidad has the diagnosis of obesity and her BMI today is 36.02 Kahliyah is in the action stage of change   ADVISE: Rasheema was educated on the multiple health risks of obesity as well as the benefit of weight loss to improve her health. She was advised of the need for long term treatment and the importance of lifestyle modifications to improve her current health and to decrease her risk of future health problems.  AGREE: Multiple dietary modification options and treatment options were discussed and  Gean agreed to follow the recommendations documented in the above note.  ARRANGE: Tiziana was educated on the importance of frequent visits to treat obesity as outlined per CMS and USPSTF guidelines and agreed to schedule her next follow up appointment today.  I, Trixie Dredge, am acting as transcriptionist for Dennard Nip, MD  I have reviewed the above documentation for accuracy and completeness,  and I agree with the above. -Dennard Nip, MD

## 2018-03-26 ENCOUNTER — Encounter (INDEPENDENT_AMBULATORY_CARE_PROVIDER_SITE_OTHER): Payer: Self-pay | Admitting: Family Medicine

## 2018-04-20 ENCOUNTER — Ambulatory Visit (INDEPENDENT_AMBULATORY_CARE_PROVIDER_SITE_OTHER): Payer: BC Managed Care – PPO | Admitting: Family Medicine

## 2018-04-21 ENCOUNTER — Encounter (INDEPENDENT_AMBULATORY_CARE_PROVIDER_SITE_OTHER): Payer: Self-pay | Admitting: Family Medicine

## 2018-04-21 ENCOUNTER — Other Ambulatory Visit: Payer: Self-pay

## 2018-04-21 ENCOUNTER — Ambulatory Visit (INDEPENDENT_AMBULATORY_CARE_PROVIDER_SITE_OTHER): Payer: BC Managed Care – PPO | Admitting: Family Medicine

## 2018-04-21 VITALS — BP 136/82 | HR 76 | Ht 62.0 in | Wt 196.0 lb

## 2018-04-21 DIAGNOSIS — Z9189 Other specified personal risk factors, not elsewhere classified: Secondary | ICD-10-CM

## 2018-04-21 DIAGNOSIS — R7303 Prediabetes: Secondary | ICD-10-CM | POA: Diagnosis not present

## 2018-04-21 DIAGNOSIS — F3289 Other specified depressive episodes: Secondary | ICD-10-CM

## 2018-04-21 DIAGNOSIS — Z6835 Body mass index (BMI) 35.0-35.9, adult: Secondary | ICD-10-CM

## 2018-04-21 DIAGNOSIS — E559 Vitamin D deficiency, unspecified: Secondary | ICD-10-CM

## 2018-04-22 MED ORDER — BUPROPION HCL ER (SR) 150 MG PO TB12: 150.0000 mg | ORAL_TABLET | Freq: Every day | ORAL | 0 refills | Status: DC

## 2018-04-22 MED ORDER — METFORMIN HCL 500 MG PO TABS: 500.0000 mg | ORAL_TABLET | Freq: Three times a day (TID) | ORAL | 0 refills | Status: DC

## 2018-04-22 MED ORDER — VITAMIN D (ERGOCALCIFEROL) 1.25 MG (50000 UNIT) PO CAPS: 50000.0000 [IU] | ORAL_CAPSULE | ORAL | 0 refills | Status: DC

## 2018-04-22 NOTE — Progress Notes (Signed)
Office: [phone]  /  Fax: [phone]   HPI:   Chief Complaint: OBESITY Grace Horton is here to discuss her progress with her obesity treatment plan. She is on the  keep a food journal with 1000 to 1200 calories and 70+ grams of protein and is following her eating plan approximately 75 % of the time. She states she is walking, biking, elliptical, and using weights for 75 minutes 5 times per week. Grace Horton continues to do well with weight loss and is struggling with hunger more in the last 2 to 3 weeks. At the same time, she isn't always eating all of her protein either.  Her weight is 196 lb (88.9 kg) today and has had a weight loss of 1 pound over a period of 4 weeks since her last visit. She has lost 23 lbs since starting treatment with [name].  Depression with emotional eating behaviors Grace Horton's mood is stable on Wellbutrin. Her blood pressure is stable. She is struggling with emotional eating and using food for comfort to the extent that it is negatively impacting her health. She often snacks when she is not hungry. Grace Horton sometimes feels she is out of control and then feels guilty that she made poor food choices. She has been working on behavior modification techniques to help reduce her emotional eating and has been somewhat successful. She denies insomnia.  Pre-Diabetes Grace Horton has a diagnosis of pre-diabetes based on her elevated Hgb A1c and was informed this puts her at greater risk of developing diabetes. She is stable on metformin currently, but notes increased polyphagia, which is worse in the last 2 to 4 weeks. She continues to work on diet and exercise to decrease risk of diabetes.   At risk for diabetes Grace Horton is at higher than average risk for developing diabetes due to her pre-diabetes and obesity. She currently denies polyuria or polydipsia.  Vitamin D Deficiency Grace Horton has a diagnosis of vitamin D deficiency. She is currently stable on vit D, but is not yet at goal. Grace Horton denies  nausea, vomiting, or muscle weakness.  ASSESSMENT AND PLAN:  Prediabetes - Plan: metFORMIN (GLUCOPHAGE) 500 MG tablet  Vitamin D deficiency - Plan: Vitamin D, Ergocalciferol, (DRISDOL) 1.25 MG (50000 UT) CAPS capsule  Other depression - with emotional eating - Plan: buPROPion (WELLBUTRIN SR) 150 MG 12 hr tablet  At risk for diabetes mellitus  Class 2 severe obesity with serious comorbidity and body mass index (BMI) of 35.0 to 35.9 in adult, unspecified obesity type ([facility])  PLAN:  Pre-Diabetes Grace Horton will continue to work on weight loss, exercise, and decreasing simple carbohydrates in her diet to help decrease the risk of diabetes. She was informed that eating too many simple carbohydrates or too many calories at one sitting increases the likelihood of GI side effects. Grace Horton agreed to increase metformin 500 mg to TID #90 with no refills and a prescription was written today. Grace Horton agreed to follow up with [name] as directed to monitor her progress.  Diabetes risk counseling Grace Horton was given extended (15 minutes) diabetes prevention counseling today. She is 48 y.o. female and has risk factors for diabetes including pre-diabetes and obesity. We discussed intensive lifestyle modifications today with an emphasis on weight loss as well as increasing exercise and decreasing simple carbohydrates in her diet.  Vitamin D Deficiency Grace Horton was informed that low vitamin D levels contributes to fatigue and are associated with obesity, breast, and colon cancer. Grace Horton agrees to continue to take prescription Vit D @50 ,000 IU every  2 weeks #4 with no refills and will follow up for routine testing of vitamin D, at least 2-3 times per year. She was informed of the risk of over-replacement of vitamin D and agrees to not increase her dose unless she discusses this with [name] first. Grace Horton agrees to follow up in 3 weeks as directed.  Depression with Emotional Eating Behaviors We discussed behavior modification  techniques today to help Grace Horton deal with her emotional eating and depression. She has agreed to take Wellbutrin SR 150 mg qAM #30 with no refills and agreed to follow up as directed.  Obesity Grace Horton is currently in the action stage of change. As such, her goal is to continue with weight loss efforts. She has agreed to keep a food journal with 1000 to 1200 calories and 70+ grams of protein.  Grace Horton has been instructed to work up to a goal of 150 minutes of combined cardio and strengthening exercise per week for weight loss and overall health benefits. We discussed the following Behavioral Modification Strategies today: increasing lean protein intake, decreasing simple carbohydrates, work on meal planning, and easy cooking plans and emotional eating strategies.  Grace Horton has agreed to follow up with our clinic in 3 weeks. She was informed of the importance of frequent follow up visits to maximize her success with intensive lifestyle modifications for her multiple health conditions.  ALLERGIES: No Known Allergies  MEDICATIONS: Current Outpatient Medications on File Prior to Visit  Medication Sig Dispense Refill   buPROPion (WELLBUTRIN SR) 150 MG 12 hr tablet Take 1 tablet (150 mg total) by mouth daily before breakfast. 30 tablet 0   Diclofenac Sodium (PENNSAID) 2 % SOLN Fingertip amount to affected area twice daily 1 Bottle 2   fluticasone (FLONASE) 50 MCG/ACT nasal spray 2 sprays in each nostril daily 16 g 0   levonorgestrel-ethinyl estradiol (AVIANE,ALESSE,LESSINA) 0.1-20 MG-MCG tablet Take 1 tablet by mouth daily.     metFORMIN (GLUCOPHAGE) 500 MG tablet Take 1 tablet (500 mg total) by mouth 2 (two) times daily with a meal. 60 tablet 0   Vitamin D, Ergocalciferol, (DRISDOL) 1.25 MG (50000 UT) CAPS capsule Take 1 capsule (50,000 Units total) by mouth every 14 (fourteen) days. 4 capsule 0   No current facility-administered medications on file prior to visit.     PAST MEDICAL  HISTORY: Past Medical History:  Diagnosis Date   AC (acromioclavicular) joint bone spurs    Anxiety    Back pain    Chondromalacia    Depression    GERD (gastroesophageal reflux disease)    History of shingles 2011   Around Left Eye   Joint pain    Raynaud disease    Swelling    in feet or legs   Vitamin D deficiency     PAST SURGICAL HISTORY: Past Surgical History:  Procedure Laterality Date   CESAREAN SECTION     SHOULDER SURGERY Left 2007   TONSILLECTOMY      SOCIAL HISTORY: Social History   Tobacco Use   Smoking status: Never Smoker   Smokeless tobacco: Never Used  Substance Use Topics   Alcohol use: No   Drug use: No    FAMILY HISTORY: Family History  Problem Relation Age of Onset   Heart disease Father    Diabetes Father    Kidney disease Father        Stage III    Transient ischemic attack Father        S/P Carotid endarterectomy   Hypertension  Father    Hyperlipidemia Father    Irritable bowel syndrome Mother    Hypertension Mother    Hyperlipidemia Mother    Obesity Mother    ROS: Review of Systems  Constitutional: Positive for weight loss.  Gastrointestinal: Negative for nausea and vomiting.  Musculoskeletal:       Negative for muscle weakness.  Endo/Heme/Allergies:       Positive for polyphagia.  Psychiatric/Behavioral: Positive for depression. The patient does not have insomnia.    PHYSICAL EXAM: Blood pressure 136/82, pulse 76, height 5\' 2"  (1.575 m), weight 196 lb (88.9 kg), SpO2 98 %. Body mass index is 35.85 kg/m. Physical Exam Vitals signs reviewed.  Constitutional:      Appearance: Normal appearance. She is obese.  Cardiovascular:     Rate and Rhythm: Normal rate.  Pulmonary:     Effort: Pulmonary effort is normal.  Musculoskeletal: Normal range of motion.  Skin:    General: Skin is warm and dry.  Neurological:     Mental Status: She is alert and oriented to person, place, and time.   Psychiatric:        Mood and Affect: Mood normal.        Behavior: Behavior normal.    RECENT LABS AND TESTS: BMET    Component Value Date/Time   NA 142 11/17/2017 1132   K 4.7 11/17/2017 1132   CL 105 11/17/2017 1132   CO2 24 11/17/2017 1132   GLUCOSE 87 11/17/2017 1132   GLUCOSE 83 05/27/2012 1119   BUN 13 11/17/2017 1132   CREATININE 0.66 11/17/2017 1132   CREATININE 0.81 05/27/2012 1119   CALCIUM 8.7 11/17/2017 1132   GFRNONAA 104 11/17/2017 1132   GFRAA 120 11/17/2017 1132   Lab Results  Component Value Date   HGBA1C 5.2 11/17/2017   HGBA1C 5.1 06/11/2017   HGBA1C 5.5 02/17/2017   HGBA1C 5.3 10/08/2016   HGBA1C 5.7 (H) 06/25/2016   Lab Results  Component Value Date   INSULIN 8.8 11/17/2017   INSULIN 6.5 06/11/2017   INSULIN 12.3 02/17/2017   INSULIN 9.7 10/08/2016   INSULIN 12.7 06/25/2016   CBC    Component Value Date/Time   WBC 6.0 11/17/2017 1132   WBC 6.7 05/27/2012 1119   RBC 4.12 11/17/2017 1132   RBC 4.46 05/27/2012 1119   HGB 12.8 11/17/2017 1132   HCT 37.7 11/17/2017 1132   PLT 317 05/27/2012 1119   MCV 92 11/17/2017 1132   MCH 31.1 11/17/2017 1132   MCH 30.7 05/27/2012 1119   MCHC 34.0 11/17/2017 1132   MCHC 33.7 05/27/2012 1119   RDW 12.2 (L) 11/17/2017 1132   LYMPHSABS 1.9 11/17/2017 1132   MONOABS 0.4 05/27/2012 1119   EOSABS 0.1 11/17/2017 1132   BASOSABS 0.0 11/17/2017 1132   Iron/TIBC/Ferritin/ %Sat No results found for: IRON, TIBC, FERRITIN, IRONPCTSAT Lipid Panel     Component Value Date/Time   CHOL 161 11/17/2017 1132   TRIG 50 11/17/2017 1132   HDL 41 11/17/2017 1132   LDLCALC 110 (H) 11/17/2017 1132   Hepatic Function Panel     Component Value Date/Time   PROT 5.8 (L) 11/17/2017 1132   ALBUMIN 3.9 11/17/2017 1132   AST 10 11/17/2017 1132   ALT 9 11/17/2017 1132   ALKPHOS 60 11/17/2017 1132   BILITOT 0.3 11/17/2017 1132      Component Value Date/Time   TSH 1.500 10/08/2016 1025   TSH 1.630 06/25/2016 1053    Results for TAJE, TONDREAU (MRN [id number])  as of 04/22/2018 12:40  Ref. Range 11/17/2017 11:32  Vitamin D, 25-Hydroxy Latest Ref Range: 30.0 - 100.0 ng/mL 60.7   OBESITY BEHAVIORAL INTERVENTION VISIT  Today's visit was # 34   Starting weight: 219 lbs Starting date: 06/25/16 Today's weight : Weight: 196 lb (88.9 kg)  Today's date: 04/22/2018 Total lbs lost to date: 23    04/21/2018  Height 5\' 2"  (1.575 m)  Weight 196 lb (88.9 kg)  BMI (Calculated) 35.84  BLOOD PRESSURE - SYSTOLIC 542  BLOOD PRESSURE - DIASTOLIC 82   Body Fat % 70.6 %  Total Body Water (lbs) 77.4 lbs   ASK: We discussed the diagnosis of obesity with Grace Horton today and Marne agreed to give [name] permission to discuss obesity behavioral modification therapy today.  ASSESS: Steffi has the diagnosis of obesity and her BMI today is 35.84. Eve is in the action stage of change.   ADVISE: Earle was educated on the multiple health risks of obesity as well as the benefit of weight loss to improve her health. She was advised of the need for long term treatment and the importance of lifestyle modifications to improve her current health and to decrease her risk of future health problems.  AGREE: Multiple dietary modification options and treatment options were discussed and Kaileah agreed to follow the recommendations documented in the above note.  ARRANGE: Keven was educated on the importance of frequent visits to treat obesity as outlined per CMS and USPSTF guidelines and agreed to schedule her next follow up appointment today.  IMarcille Blanco, CMA, am acting as transcriptionist for Starlyn Skeans, MD  I have reviewed the above documentation for accuracy and completeness, and I agree with the above. -Dennard Nip, MD

## 2018-04-28 ENCOUNTER — Ambulatory Visit (INDEPENDENT_AMBULATORY_CARE_PROVIDER_SITE_OTHER)
Admission: RE | Admit: 2018-04-28 | Discharge: 2018-04-28 | Disposition: A | Discharge status: Home / Self Care | Payer: BC Managed Care – PPO | Source: Ambulatory Visit | Attending: Family Medicine | Admitting: Family Medicine

## 2018-04-28 ENCOUNTER — Other Ambulatory Visit: Payer: Self-pay

## 2018-04-28 ENCOUNTER — Ambulatory Visit: Payer: BC Managed Care – PPO | Admitting: Family Medicine

## 2018-04-28 ENCOUNTER — Encounter: Payer: Self-pay | Admitting: Family Medicine

## 2018-04-28 VITALS — BP 140/90 | HR 74 | Ht 62.0 in | Wt 203.0 lb

## 2018-04-28 DIAGNOSIS — E559 Vitamin D deficiency, unspecified: Secondary | ICD-10-CM | POA: Diagnosis not present

## 2018-04-28 DIAGNOSIS — G2589 Other specified extrapyramidal and movement disorders: Secondary | ICD-10-CM

## 2018-04-28 DIAGNOSIS — M546 Pain in thoracic spine: Secondary | ICD-10-CM

## 2018-04-28 DIAGNOSIS — M999 Biomechanical lesion, unspecified: Secondary | ICD-10-CM | POA: Diagnosis not present

## 2018-04-28 MED ORDER — VITAMIN D (ERGOCALCIFEROL) 1.25 MG (50000 UNIT) PO CAPS: 50000.0000 [IU] | ORAL_CAPSULE | ORAL | 0 refills | Status: DC

## 2018-04-28 MED ORDER — DICLOFENAC SODIUM 2 % TD SOLN: 2.0000 g | Freq: Two times a day (BID) | TRANSDERMAL | 3 refills | Status: AC

## 2018-04-28 NOTE — Assessment & Plan Note (Signed)
Decision today to treat with OMT was based on Physical Exam  After verbal consent patient was treated with HVLA, ME, FPR techniques in cervical, thoracic, rib,  areas  Patient tolerated the procedure well with improvement in symptoms  Patient given exercises, stretches and lifestyle modifications  See medications in patient instructions if given  Patient will follow up in 4-8 weeks 

## 2018-04-28 NOTE — Progress Notes (Signed)
[facility] [address] [place], [place] [address] Phone: [phone] Subjective:   I Grace Horton am serving as a Education administrator for Dr. Hulan Saas.  CC: Back pain  [id number]  Grace Horton is a 48 y.o. female coming in with complaint of back pain. No numbness and tingling noted.   Onset- Chronic  Location- mid back  Character- sharp, dull, achy, sore Aggravating factors- laying on her back  Therapies tried- Heat Severity-7 out of 10     Past Medical History:  Diagnosis Date  . AC (acromioclavicular) joint bone spurs   . Anxiety   . Back pain   . Chondromalacia   . Depression   . GERD (gastroesophageal reflux disease)   . History of shingles 2011   Around Left Eye  . Joint pain   . Raynaud disease   . Swelling    in feet or legs  . Vitamin D deficiency    Past Surgical History:  Procedure Laterality Date  . CESAREAN SECTION    . SHOULDER SURGERY Left 2007  . TONSILLECTOMY     Social History   Socioeconomic History  . Marital status: Divorced    Spouse name: Not on file  . Number of children: 1  . Years of education: 1  . Highest education level: Not on file  Occupational History  . 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 Financial resource strain: Not on file  . Food insecurity:    Worry: Not on file    Inability: Not on file  . Transportation needs:    Medical: Not on file    Non-medical: Not on file  Tobacco Use  . Smoking status: Never Smoker  . Smokeless tobacco: Never Used  Substance and Sexual Activity  . Alcohol use: No  . Drug use: No  . Sexual activity: Yes    Partners: Male  Lifestyle  . Physical activity:    Days per week: Not on file    Minutes per session: Not on file  . Stress: Not on file  Relationships  . Social connections:    Talks on phone: Not on file    Gets together: Not on file    Attends religious service: Not on file    Active  member of club or organization: Not on file    Attends meetings of clubs or organizations: Not on file    Relationship status: Not on file  Other Topics Concern  . Not on file  Social History Narrative   Marital Status: Divorced; (Significant Other - Grace Horton)    Children:  Daughter Grace Horton) 1997    Pets:  Dog    Living Situation: Lives with Grace Horton and daughter.     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 Heart disease Father   . Diabetes Father   . Kidney disease Father        Stage III   . Transient ischemic attack Father        S/P Carotid endarterectomy  . Hypertension Father   .  Hyperlipidemia Father   . Irritable bowel syndrome Mother   . Hypertension Mother   . Hyperlipidemia Mother   . Obesity Mother     Current Outpatient Medications (Endocrine & Metabolic):  .  levonorgestrel-ethinyl estradiol (AVIANE,ALESSE,LESSINA) 0.1-20 MG-MCG tablet, Take 1 tablet by mouth daily. .  metFORMIN (GLUCOPHAGE) 500 MG tablet, Take 1 tablet (500 mg total) by mouth 3 (three) times daily.   Current Outpatient Medications (Respiratory):  .  fluticasone (FLONASE) 50 MCG/ACT nasal spray, 2 sprays in each nostril daily    Current Outpatient Medications (Other):  Marland Kitchen  buPROPion (WELLBUTRIN SR) 150 MG 12 hr tablet, Take 1 tablet (150 mg total) by mouth daily before breakfast. .  Diclofenac Sodium (PENNSAID) 2 % SOLN, Fingertip amount to affected area twice daily .  Vitamin D, Ergocalciferol, (DRISDOL) 1.25 MG (50000 UT) CAPS capsule, Take 1 capsule (50,000 Units total) by mouth every 14 (fourteen) days. .  Diclofenac Sodium 2 % SOLN, Place 2 g onto the skin 2 (two) times daily.    Past medical history,  social, surgical and family history all reviewed in electronic medical record.  No pertanent information unless stated regarding to the chief complaint.   Review of Systems:  No headache, visual changes, nausea, vomiting, diarrhea, constipation, dizziness, abdominal pain, skin rash, fevers, chills, night sweats, weight loss, swollen lymph nodes, body aches, joint swelling, , chest pain, shortness of breath, mood changes.  Positive muscle aches  Objective  Blood pressure 140/90, pulse 74, height 5\' 2"  (1.575 m), weight 203 lb (92.1 kg), SpO2 96 %.   General: No apparent distress alert and oriented x3 mood and affect normal, dressed appropriately.  HEENT: Pupils equal, extraocular movements intact  Respiratory: Patient's speak in full sentences and does not appear short of breath  Cardiovascular: No lower extremity edema, non tender, no erythema  Skin: Warm dry intact with no signs of infection or rash on extremities or on axial skeleton.  Abdomen: Soft nontender  Neuro: Cranial nerves II through XII are intact, neurovascularly intact in all extremities with 2+ DTRs and 2+ pulses.  Lymph: No lymphadenopathy of posterior or anterior cervical chain or axillae bilaterally.  Gait normal with good balance and coordination.  MSK:  Non tender with full range of motion and good stability and symmetric strength and tone of shoulders, elbows, wrist, hip, knee and ankles bilaterally.  Back exam shows the patient does have tightness in the midthoracic spine.  Patient does have a sharp pain with certain range of motion.  Patient does have some mild scapular dyskinesis.  Pain seems to be out of proportion.    Osteopathic findings  C6 flexed rotated and side bent left T3 extended rotated and side bent right inhaled third rib T9 extended rotated and side bent left L2 flexed rotated and side bent right Sacrum right on right   [id number]; 15 additional minutes spent for Therapeutic exercises as stated in above  notes.  This included exercises focusing on stretching, strengthening, with significant focus on eccentric aspects.   Long term goals include an improvement in range of motion, strength, endurance as well as avoiding reinjury. Patient's frequency would include in 1-2 times a day, 3-5 times a week for a duration of 6-12 weeks. Exercises that included:  Basic scapular stabilization to include adduction and depression of scapula Scaption, focusing on proper movement and good control Internal and External rotation utilizing a theraband, with elbow tucked at side entire time Rows with theraband   Proper technique shown and  discussed handout in great detail with ATC.  All questions were discussed and answered.     Impression and Recommendations:     This case required medical decision making of moderate complexity. The above documentation has been reviewed and is accurate and complete Lyndal Pulley, DO       Note: This dictation was prepared with Dragon dictation along with smaller phrase technology. Any transcriptional errors that result from this process are unintentional.

## 2018-04-28 NOTE — Assessment & Plan Note (Signed)
Mild scapular dyskinesis.  Discussed which activities of doing which wants to avoid.  Discussed posture and ergonomics.  X-rays ordered today to return to rule out any bony abnormalities likely contributing to some pain.  Patient given topical anti-inflammatories.  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  Follow-up again in 4 to 8 weeks

## 2018-04-28 NOTE — Patient Instructions (Signed)
Great to see you  Ice 20 minutes 2 times daily. Usually after activity and before bed. Exercises 3 times a week.  pennsaid pinkie amount topically 2 times daily as needed.  Prilosec over the counter 2 times a day for 2 weeks Xrays downstairs today  See me again In 6 weeks

## 2018-04-30 ENCOUNTER — Encounter: Payer: Self-pay | Admitting: Family Medicine

## 2018-04-30 DIAGNOSIS — E559 Vitamin D deficiency, unspecified: Secondary | ICD-10-CM

## 2018-05-03 MED ORDER — VITAMIN D (ERGOCALCIFEROL) 1.25 MG (50000 UNIT) PO CAPS: 50000.0000 [IU] | ORAL_CAPSULE | ORAL | 0 refills | Status: DC

## 2018-05-05 ENCOUNTER — Encounter (INDEPENDENT_AMBULATORY_CARE_PROVIDER_SITE_OTHER): Payer: Self-pay

## 2018-05-12 ENCOUNTER — Other Ambulatory Visit: Payer: Self-pay | Admitting: Family Medicine

## 2018-05-12 DIAGNOSIS — E559 Vitamin D deficiency, unspecified: Secondary | ICD-10-CM

## 2018-05-19 ENCOUNTER — Encounter (INDEPENDENT_AMBULATORY_CARE_PROVIDER_SITE_OTHER): Payer: Self-pay | Admitting: Family Medicine

## 2018-05-19 ENCOUNTER — Ambulatory Visit (INDEPENDENT_AMBULATORY_CARE_PROVIDER_SITE_OTHER): Payer: BC Managed Care – PPO | Admitting: Family Medicine

## 2018-05-19 ENCOUNTER — Other Ambulatory Visit: Payer: Self-pay

## 2018-05-19 DIAGNOSIS — R7303 Prediabetes: Secondary | ICD-10-CM

## 2018-05-19 DIAGNOSIS — Z6837 Body mass index (BMI) 37.0-37.9, adult: Secondary | ICD-10-CM

## 2018-05-19 DIAGNOSIS — F3289 Other specified depressive episodes: Secondary | ICD-10-CM

## 2018-05-19 MED ORDER — INSULIN PEN NEEDLE 32G X 4 MM MISC: 1.0000 | Freq: Two times a day (BID) | 0 refills | Status: DC

## 2018-05-19 MED ORDER — BUPROPION HCL ER (SR) 150 MG PO TB12: 150.0000 mg | ORAL_TABLET | Freq: Every day | ORAL | 0 refills | Status: DC

## 2018-05-19 MED ORDER — LIRAGLUTIDE -WEIGHT MANAGEMENT 18 MG/3ML ~~LOC~~ SOPN: 3.0000 mg | PEN_INJECTOR | Freq: Every day | SUBCUTANEOUS | 0 refills | Status: DC

## 2018-05-19 MED ORDER — METFORMIN HCL 500 MG PO TABS: 500.0000 mg | ORAL_TABLET | Freq: Three times a day (TID) | ORAL | 0 refills | Status: DC

## 2018-05-19 NOTE — Progress Notes (Signed)
Office: [phone]  /  Fax: [phone] TeleHealth Visit:  [name] has verbally consented to this TeleHealth visit today. The patient is located at home, the provider is located at the [facility] office. The participants in this visit include the listed provider and patient. The visit was conducted today via Face Time.  HPI:   Chief Complaint: OBESITY Grace Horton is here to discuss her progress with her obesity treatment plan. She is keeping a food journal with 1000 to 1200 calories and 70+ grams of protein and is following her eating plan approximately 50 % of the time. She states she is hiking and doing physical therapy exercises. Grace Horton has done well maintaining her weight during social isolation. She normally works from home. She stopped Belviq due to the recall and she notes increased hunger more than increased cravings. Grace Horton has been on phentermine, Belviq, and Qsymia.  We were unable to weigh the patient today for this TeleHealth visit. She feels as if she has gained weight since her last visit. She has lost 23 lbs since starting treatment with [name].  Depression with emotional eating behaviors Grace Horton is doing well on Wellbutrin and she feels that she is doing well with controlling emotional eating and using food for comfort to the extent that it is negatively impacting her health. She has been working on behavior modification techniques to help reduce her emotional eating and has been somewhat successful. She denies insomnia or palpitations.  Pre-Diabetes [name] has a diagnosis of pre-diabetes based on her elevated Hgb A1c and was informed this puts her at greater risk of developing diabetes. She is stable on metformin, but still notes polyphagia. She is working on diet and weight loss to decrease risk of diabetes.  ASSESSMENT AND PLAN:  Prediabetes - Plan: metFORMIN (GLUCOPHAGE) 500 MG tablet  Other depression - with emotional eating - Plan: buPROPion (WELLBUTRIN  SR) 150 MG 12 hr tablet  Class 2 severe obesity with serious comorbidity and body mass index (BMI) of 37.0 to 37.9 in adult, unspecified obesity type ([place]) - Plan: Liraglutide -Weight Management (SAXENDA) 18 MG/3ML SOPN, Insulin Pen Needle (BD PEN NEEDLE NANO 2ND GEN) 32G X 4 MM MISC  PLAN:  Pre-Diabetes Grace Horton will continue to work on weight loss, exercise, and decreasing simple carbohydrates in her diet to help decrease the risk of diabetes. She was informed that eating too many simple carbohydrates or too many calories at one sitting increases the likelihood of GI side effects. Grace Horton agreed continue metformin 500 mg TID # 90 with no refills and a prescription was written today. [name] agreed to discontinue metformin after starting Saxenda. She will follow up with [name] as directed to monitor her progress in 3 weeks.   Depression with Emotional Eating Behaviors We discussed behavior modification techniques today to help [name] deal with her emotional eating and depression. She has agreed to take Wellbutrin SR 150 mg qAM #30 with no refills and agreed to follow up as directed.  Obesity Grace Horton is currently in the action stage of change. As such, her goal is to continue with weight loss efforts. She has agreed to keep a food journal with 1000 to 1200 calories and 70+ grams of protein.  Grace Horton has been instructed to work up to a goal of 150 minutes of combined cardio and strengthening exercise per week for weight loss and overall health benefits. We discussed the following Behavioral Modification Strategies today: work on meal planning and easy cooking plans, no skipping meals, and  ways to avoid boredom eating. We discussed various medication options to help Grace Horton with her weight loss efforts and we both agreed to start Saxenda  3.0 mg qd #5 pens with nano needles and no refills.  Leanny has agreed to follow up with our clinic in 3 weeks. She was informed of the importance of frequent follow up  visits to maximize her success with intensive lifestyle modifications for her multiple health conditions.  ALLERGIES: No Known Allergies  MEDICATIONS: Current Outpatient Medications on File Prior to Visit  Medication Sig Dispense Refill  . Diclofenac Sodium (PENNSAID) 2 % SOLN Fingertip amount to affected area twice daily 1 Bottle 2  . Diclofenac Sodium 2 % SOLN Place 2 g onto the skin 2 (two) times daily. 112 g 3  . fluticasone (FLONASE) 50 MCG/ACT nasal spray 2 sprays in each nostril daily 16 g 0  . levonorgestrel-ethinyl estradiol (AVIANE,ALESSE,LESSINA) 0.1-20 MG-MCG tablet Take 1 tablet by mouth daily.    . Vitamin D, Ergocalciferol, (DRISDOL) 1.25 MG (50000 UT) CAPS capsule TAKE 1 CAPSULE BY MOUTH EVERY 14 DAYS. 4 capsule 0   No current facility-administered medications on file prior to visit.     PAST MEDICAL HISTORY: Past Medical History:  Diagnosis Date  . AC (acromioclavicular) joint bone spurs   . Anxiety   . Back pain   . Chondromalacia   . Depression   . GERD (gastroesophageal reflux disease)   . History of shingles 2011   Around Left Eye  . Joint pain   . Raynaud disease   . Swelling    in feet or legs  . Vitamin D deficiency     PAST SURGICAL HISTORY: Past Surgical History:  Procedure Laterality Date  . CESAREAN SECTION    . SHOULDER SURGERY Left 2007  . TONSILLECTOMY      SOCIAL HISTORY: Social History   Tobacco Use  . Smoking status: Never Smoker  . Smokeless tobacco: Never Used  Substance Use Topics  . Alcohol use: No  . Drug use: No    FAMILY HISTORY: Family History  Problem Relation Age of Onset  . Heart disease Father   . Diabetes Father   . Kidney disease Father        Stage III   . Transient ischemic attack Father        S/P Carotid endarterectomy  . Hypertension Father   . Hyperlipidemia Father   . Irritable bowel syndrome Mother   . Hypertension Mother   . Hyperlipidemia Mother   . Obesity Mother     ROS: Review of  Systems  Cardiovascular: Negative for palpitations.  Endo/Heme/Allergies:       Positive for polyphagia.  Psychiatric/Behavioral: Positive for depression. The patient does not have insomnia.     PHYSICAL EXAM: Pt in no acute distress  RECENT LABS AND TESTS: BMET    Component Value Date/Time   NA 142 11/17/2017 1132   K 4.7 11/17/2017 1132   CL 105 11/17/2017 1132   CO2 24 11/17/2017 1132   GLUCOSE 87 11/17/2017 1132   GLUCOSE 83 05/27/2012 1119   BUN 13 11/17/2017 1132   CREATININE 0.66 11/17/2017 1132   CREATININE 0.81 05/27/2012 1119   CALCIUM 8.7 11/17/2017 1132   GFRNONAA 104 11/17/2017 1132   GFRAA 120 11/17/2017 1132   Lab Results  Component Value Date   HGBA1C 5.2 11/17/2017   HGBA1C 5.1 06/11/2017   HGBA1C 5.5 02/17/2017   HGBA1C 5.3 10/08/2016   HGBA1C 5.7 (H) 06/25/2016  Lab Results  Component Value Date   INSULIN 8.8 11/17/2017   INSULIN 6.5 06/11/2017   INSULIN 12.3 02/17/2017   INSULIN 9.7 10/08/2016   INSULIN 12.7 06/25/2016   CBC    Component Value Date/Time   WBC 6.0 11/17/2017 1132   WBC 6.7 05/27/2012 1119   RBC 4.12 11/17/2017 1132   RBC 4.46 05/27/2012 1119   HGB 12.8 11/17/2017 1132   HCT 37.7 11/17/2017 1132   PLT 317 05/27/2012 1119   MCV 92 11/17/2017 1132   MCH 31.1 11/17/2017 1132   MCH 30.7 05/27/2012 1119   MCHC 34.0 11/17/2017 1132   MCHC 33.7 05/27/2012 1119   RDW 12.2 (L) 11/17/2017 1132   LYMPHSABS 1.9 11/17/2017 1132   MONOABS 0.4 05/27/2012 1119   EOSABS 0.1 11/17/2017 1132   BASOSABS 0.0 11/17/2017 1132   Iron/TIBC/Ferritin/ %Sat No results found for: IRON, TIBC, FERRITIN, IRONPCTSAT Lipid Panel     Component Value Date/Time   CHOL 161 11/17/2017 1132   TRIG 50 11/17/2017 1132   HDL 41 11/17/2017 1132   LDLCALC 110 (H) 11/17/2017 1132   Hepatic Function Panel     Component Value Date/Time   PROT 5.8 (L) 11/17/2017 1132   ALBUMIN 3.9 11/17/2017 1132   AST 10 11/17/2017 1132   ALT 9 11/17/2017 1132    ALKPHOS 60 11/17/2017 1132   BILITOT 0.3 11/17/2017 1132      Component Value Date/Time   TSH 1.500 10/08/2016 1025   TSH 1.630 06/25/2016 1053   Results for MARJI, KUEHNEL (MRN [id number]) as of 05/19/2018 14:58  Ref. Range 11/17/2017 11:32  Vitamin D, 25-Hydroxy Latest Ref Range: 30.0 - 100.0 ng/mL 60.7    I, Marcille Blanco, CMA, am acting as transcriptionist for Starlyn Skeans, MD I have reviewed the above documentation for accuracy and completeness, and I agree with the above. -Dennard Nip, MD

## 2018-05-20 ENCOUNTER — Encounter (INDEPENDENT_AMBULATORY_CARE_PROVIDER_SITE_OTHER): Payer: Self-pay

## 2018-05-23 ENCOUNTER — Other Ambulatory Visit (INDEPENDENT_AMBULATORY_CARE_PROVIDER_SITE_OTHER): Payer: Self-pay | Admitting: Family Medicine

## 2018-05-23 DIAGNOSIS — R7303 Prediabetes: Secondary | ICD-10-CM

## 2018-06-08 NOTE — Progress Notes (Signed)
[facility] [address] [place], [place] [address] Phone: [phone] Subjective:      CC: Back pain follow-up  [id number]  Grace Horton is a 48 y.o. female coming in with complaint of back pain. States that her pain isn't as sharp as before. Making some progress. Patient states that she is given a 50% better.  Has noticed that if she does the exercises on a regular basis it seems to be helpful. Patient has noted some discomfort and pain overall.  Patient states that it is only when she does certain activities.    Past Medical History:  Diagnosis Date  . AC (acromioclavicular) joint bone spurs   . Anxiety   . Back pain   . Chondromalacia   . Depression   . GERD (gastroesophageal reflux disease)   . History of shingles 2011   Around Left Eye  . Joint pain   . Raynaud disease   . Swelling    in feet or legs  . Vitamin D deficiency    Past Surgical History:  Procedure Laterality Date  . CESAREAN SECTION    . SHOULDER SURGERY Left 2007  . TONSILLECTOMY     Social History   Socioeconomic History  . Marital status: Divorced    Spouse name: Not on file  . Number of children: 1  . Years of education: 69  . Highest education level: Not on file  Occupational History  . 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 Financial resource strain: Not on file  . Food insecurity:    Worry: Not on file    Inability: Not on file  . Transportation needs:    Medical: Not on file    Non-medical: Not on file  Tobacco Use  . Smoking status: Never Smoker  . Smokeless tobacco: Never Used  Substance and Sexual Activity  . Alcohol use: No  . Drug use: No  . Sexual activity: Yes    Partners: Male  Lifestyle  . Physical activity:    Days per week: Not on file    Minutes per session: Not on file  . Stress: Not on file  Relationships  . Social connections:    Talks on phone: Not on file    Gets  together: Not on file    Attends religious service: Not on file    Active member of club or organization: Not on file    Attends meetings of clubs or organizations: Not on file    Relationship status: Not on file  Other Topics Concern  . Not on file  Social History Narrative   Marital Status: Divorced; (Significant Other - Todd)    Children:  Daughter Jarrett Soho) 1997    Pets:  Dog    Living Situation: Lives with Sherren Mocha and daughter.     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 Heart disease Father   . Diabetes Father   . Kidney disease Father        Stage III   . Transient ischemic attack Father  S/P Carotid endarterectomy  . Hypertension Father   . Hyperlipidemia Father   . Irritable bowel syndrome Mother   . Hypertension Mother   . Hyperlipidemia Mother   . Obesity Mother     Current Outpatient Medications (Endocrine & Metabolic):  .  levonorgestrel-ethinyl estradiol (AVIANE,ALESSE,LESSINA) 0.1-20 MG-MCG tablet, Take 1 tablet by mouth daily. .  metFORMIN (GLUCOPHAGE) 500 MG tablet, Take 1 tablet (500 mg total) by mouth 3 (three) times daily.   Current Outpatient Medications (Respiratory):  .  fluticasone (FLONASE) 50 MCG/ACT nasal spray, 2 sprays in each nostril daily    Current Outpatient Medications (Other):  Marland Kitchen  buPROPion (WELLBUTRIN SR) 150 MG 12 hr tablet, Take 1 tablet (150 mg total) by mouth daily before breakfast. .  Diclofenac Sodium (PENNSAID) 2 % SOLN, Fingertip amount to affected area twice daily .  Diclofenac Sodium 2 % SOLN, Place 2 g onto the skin 2 (two) times daily. .  Insulin Pen Needle (BD PEN NEEDLE NANO 2ND GEN) 32G X 4 MM MISC, 1 Package by Does not apply  route 2 (two) times daily. .  Liraglutide -Weight Management (SAXENDA) 18 MG/3ML SOPN, Inject 3 mg into the skin daily. .  Vitamin D, Ergocalciferol, (DRISDOL) 1.25 MG (50000 UT) CAPS capsule, TAKE 1 CAPSULE BY MOUTH EVERY 14 DAYS.    Past medical history, social, surgical and family history all reviewed in electronic medical record.  No pertanent information unless stated regarding to the chief complaint.   Review of Systems:  No headache, visual changes, nausea, vomiting, diarrhea, constipation, dizziness, abdominal pain, skin rash, fevers, chills, night sweats, weight loss, swollen lymph nodes, body aches, joint swelling,  chest pain, shortness of breath, mood changes.  Positive muscle aches  Objective  Blood pressure 130/82, pulse 72, height 5\' 2"  (1.575 m), weight 200 lb (90.7 kg), SpO2 98 %.    General: No apparent distress alert and oriented x3 mood and affect normal, dressed appropriately.  HEENT: Pupils equal, extraocular movements intact  Respiratory: Patient's speak in full sentences and does not appear short of breath  Cardiovascular: No lower extremity edema, non tender, no erythema  Skin: Warm dry intact with no signs of infection or rash on extremities or on axial skeleton.  Abdomen: Soft nontender  Neuro: Cranial nerves II through XII are intact, neurovascularly intact in all extremities with 2+ DTRs and 2+ pulses.  Lymph: No lymphadenopathy of posterior or anterior cervical chain or axillae bilaterally.  Gait normal with good balance and coordination.  MSK:  Non tender with full range of motion and good stability and symmetric strength and tone of shoulders, elbows, wrist, hip, knee and ankles bilaterally.  Neck: Inspection loss of lordosis. No palpable stepoffs. Negative Spurling's maneuver. Full neck range of motion Grip strength and sensation normal in bilateral hands Strength good C4 to T1 distribution No sensory change to C4 to T1 Negative Hoffman sign  bilaterally Reflexes normal Tightness bilaterally   Osteopathic findings C2 flexed rotated and side bent right C6 flexed rotated and side bent left T3 extended rotated and side bent right inhaled third rib T6 extended rotated and side bent left L2 flexed rotated and side bent right Sacrum right on right    Impression and Recommendations:     This case required medical decision making of moderate complexity. The above documentation has been reviewed and is accurate and complete Lyndal Pulley, DO       Note: This dictation was prepared with Dragon dictation along with smaller phrase  technology. Any transcriptional errors that result from this process are unintentional.

## 2018-06-09 ENCOUNTER — Other Ambulatory Visit (INDEPENDENT_AMBULATORY_CARE_PROVIDER_SITE_OTHER): Payer: Self-pay | Admitting: Family Medicine

## 2018-06-09 ENCOUNTER — Other Ambulatory Visit: Payer: Self-pay

## 2018-06-09 ENCOUNTER — Ambulatory Visit: Payer: BC Managed Care – PPO | Admitting: Family Medicine

## 2018-06-09 ENCOUNTER — Encounter: Payer: Self-pay | Admitting: Family Medicine

## 2018-06-09 VITALS — BP 130/82 | HR 72 | Ht 62.0 in | Wt 200.0 lb

## 2018-06-09 DIAGNOSIS — G2589 Other specified extrapyramidal and movement disorders: Secondary | ICD-10-CM

## 2018-06-09 DIAGNOSIS — M999 Biomechanical lesion, unspecified: Secondary | ICD-10-CM | POA: Insufficient documentation

## 2018-06-09 DIAGNOSIS — R7303 Prediabetes: Secondary | ICD-10-CM

## 2018-06-09 NOTE — Assessment & Plan Note (Signed)
Scapular dyskinesis.  Discussed with patient again about posture and ergonomics throughout the day.  Has responded well to manipulation and started again.  Warned patient different treatment options otherwise.  Patient will continue with this.  Unable to do formal physical therapy secondary to coronavirus outbreak.  Follow-up with me again in 6 weeks

## 2018-06-09 NOTE — Assessment & Plan Note (Signed)
Decision today to treat with OMT was based on Physical Exam  After verbal consent patient was treated with HVLA, ME, FPR techniques in cervical, thoracic, rib lumbar and sacral areas  Patient tolerated the procedure well with improvement in symptoms  Patient given exercises, stretches and lifestyle modifications  See medications in patient instructions if given  Patient will follow up in 6 weeks 

## 2018-06-10 ENCOUNTER — Other Ambulatory Visit (INDEPENDENT_AMBULATORY_CARE_PROVIDER_SITE_OTHER): Payer: Self-pay | Admitting: Family Medicine

## 2018-06-10 ENCOUNTER — Ambulatory Visit (INDEPENDENT_AMBULATORY_CARE_PROVIDER_SITE_OTHER): Payer: BC Managed Care – PPO | Admitting: Family Medicine

## 2018-06-10 ENCOUNTER — Encounter (INDEPENDENT_AMBULATORY_CARE_PROVIDER_SITE_OTHER): Payer: Self-pay | Admitting: Family Medicine

## 2018-06-10 DIAGNOSIS — K5909 Other constipation: Secondary | ICD-10-CM | POA: Diagnosis not present

## 2018-06-10 DIAGNOSIS — Z6836 Body mass index (BMI) 36.0-36.9, adult: Secondary | ICD-10-CM

## 2018-06-10 DIAGNOSIS — R7303 Prediabetes: Secondary | ICD-10-CM

## 2018-06-10 NOTE — Progress Notes (Signed)
Office: [phone]  /  Fax: [phone] TeleHealth Visit:  Pia Jedlicka has verbally consented to this TeleHealth visit today. The patient is located at home, the provider is located at the [facility] office. The participants in this visit include the listed provider and patient. The visit was conducted today via Face Time.  HPI:   Chief Complaint: OBESITY Ervin is here to discuss her progress with her obesity treatment plan. She is keeping a food journal with 1000 to 1200 calories and 70+ grams of protein and is following her eating plan approximately 90 % of the time. She states she is walking 3 miles 60 minutes 5 to 6 times per week. Avari states that she is doing well with her journal and weight loss. She feels that she has lost 3 pounds. Arlie started [name] and is doing well.  We were unable to weigh the patient today for this TeleHealth visit. She feels as if she has lost weight since her last visit. She has lost 23 lbs since starting treatment with [name].  Constipation Takasha notes decreased BM frequency, but denies abdominal pain, which has been worse in the last 2 weeks since starting Saxenda. She notes that BM's are not hard and painful.   ASSESSMENT AND PLAN:  Other constipation  Class 2 severe obesity with serious comorbidity and body mass index (BMI) of 36.0 to 36.9 in adult, unspecified obesity type ([place])  PLAN:  Constipation Kendria was educated on how Saxenda affects her BM and this will eventually normalize. She was informed that decreased bowel movement frequency is normal while losing weight, but stools should not be hard or painful. She was advised to increase her H20 intake and work on increasing her fiber intake. High fiber foods were discussed today. Casandra is ok to take OTC Miralax 17 gm/day and to follow up as directed in 3 weeks.  Obesity Lesli is currently in the action stage of change. As such, her goal is to continue with weight loss  efforts. She has agreed to keep a food journal with 1200 calories and 70+ grams of protein.  Lenor has been instructed to work up to a goal of 150 minutes of combined cardio and strengthening exercise per week for weight loss and overall health benefits. We discussed the following Behavioral Modification Strategies today: increase H2O intake, increasing fiber rich foods, and better snacking choices. We discussed various medication options to help Mckinleigh with her weight loss efforts and we both agreed to continue with Saxenda at 0.6 mg with no refills needed.  Regene has agreed to follow up with our clinic in 3 weeks. She was informed of the importance of frequent follow up visits to maximize her success with intensive lifestyle modifications for her multiple health conditions.  ALLERGIES: No Known Allergies  MEDICATIONS: Current Outpatient Medications on File Prior to Visit  Medication Sig Dispense Refill  . buPROPion (WELLBUTRIN SR) 150 MG 12 hr tablet Take 1 tablet (150 mg total) by mouth daily before breakfast. 30 tablet 0  . Diclofenac Sodium (PENNSAID) 2 % SOLN Fingertip amount to affected area twice daily 1 Bottle 2  . Diclofenac Sodium 2 % SOLN Place 2 g onto the skin 2 (two) times daily. 112 g 3  . fluticasone (FLONASE) 50 MCG/ACT nasal spray 2 sprays in each nostril daily 16 g 0  . Insulin Pen Needle (BD PEN NEEDLE NANO 2ND GEN) 32G X 4 MM MISC 1 Package by Does not apply route 2 (two) times  daily. 100 each 0  . levonorgestrel-ethinyl estradiol (AVIANE,ALESSE,LESSINA) 0.1-20 MG-MCG tablet Take 1 tablet by mouth daily.    . Liraglutide -Weight Management (SAXENDA) 18 MG/3ML SOPN Inject 3 mg into the skin daily. 5 pen 0  . metFORMIN (GLUCOPHAGE) 500 MG tablet Take 1 tablet (500 mg total) by mouth 3 (three) times daily. 90 tablet 0  . Vitamin D, Ergocalciferol, (DRISDOL) 1.25 MG (50000 UT) CAPS capsule TAKE 1 CAPSULE BY MOUTH EVERY 14 DAYS. 4 capsule 0   No current  facility-administered medications on file prior to visit.     PAST MEDICAL HISTORY: Past Medical History:  Diagnosis Date  . AC (acromioclavicular) joint bone spurs   . Anxiety   . Back pain   . Chondromalacia   . Depression   . GERD (gastroesophageal reflux disease)   . History of shingles 2011   Around Left Eye  . Joint pain   . Raynaud disease   . Swelling    in feet or legs  . Vitamin D deficiency     PAST SURGICAL HISTORY: Past Surgical History:  Procedure Laterality Date  . CESAREAN SECTION    . SHOULDER SURGERY Left 2007  . TONSILLECTOMY      SOCIAL HISTORY: Social History   Tobacco Use  . Smoking status: Never Smoker  . Smokeless tobacco: Never Used  Substance Use Topics  . Alcohol use: No  . Drug use: No    FAMILY HISTORY: Family History  Problem Relation Age of Onset  . Heart disease Father   . Diabetes Father   . Kidney disease Father        Stage III   . Transient ischemic attack Father        S/P Carotid endarterectomy  . Hypertension Father   . Hyperlipidemia Father   . Irritable bowel syndrome Mother   . Hypertension Mother   . Hyperlipidemia Mother   . Obesity Mother     ROS: Review of Systems  Gastrointestinal: Positive for constipation. Negative for abdominal pain.    PHYSICAL EXAM: Pt in no acute distress  RECENT LABS AND TESTS: BMET    Component Value Date/Time   NA 142 11/17/2017 1132   K 4.7 11/17/2017 1132   CL 105 11/17/2017 1132   CO2 24 11/17/2017 1132   GLUCOSE 87 11/17/2017 1132   GLUCOSE 83 05/27/2012 1119   BUN 13 11/17/2017 1132   CREATININE 0.66 11/17/2017 1132   CREATININE 0.81 05/27/2012 1119   CALCIUM 8.7 11/17/2017 1132   GFRNONAA 104 11/17/2017 1132   GFRAA 120 11/17/2017 1132   Lab Results  Component Value Date   HGBA1C 5.2 11/17/2017   HGBA1C 5.1 06/11/2017   HGBA1C 5.5 02/17/2017   HGBA1C 5.3 10/08/2016   HGBA1C 5.7 (H) 06/25/2016   Lab Results  Component Value Date   INSULIN 8.8  11/17/2017   INSULIN 6.5 06/11/2017   INSULIN 12.3 02/17/2017   INSULIN 9.7 10/08/2016   INSULIN 12.7 06/25/2016   CBC    Component Value Date/Time   WBC 6.0 11/17/2017 1132   WBC 6.7 05/27/2012 1119   RBC 4.12 11/17/2017 1132   RBC 4.46 05/27/2012 1119   HGB 12.8 11/17/2017 1132   HCT 37.7 11/17/2017 1132   PLT 317 05/27/2012 1119   MCV 92 11/17/2017 1132   MCH 31.1 11/17/2017 1132   MCH 30.7 05/27/2012 1119   MCHC 34.0 11/17/2017 1132   MCHC 33.7 05/27/2012 1119   RDW 12.2 (L) 11/17/2017 1132  LYMPHSABS 1.9 11/17/2017 1132   MONOABS 0.4 05/27/2012 1119   EOSABS 0.1 11/17/2017 1132   BASOSABS 0.0 11/17/2017 1132   Iron/TIBC/Ferritin/ %Sat No results found for: IRON, TIBC, FERRITIN, IRONPCTSAT Lipid Panel     Component Value Date/Time   CHOL 161 11/17/2017 1132   TRIG 50 11/17/2017 1132   HDL 41 11/17/2017 1132   LDLCALC 110 (H) 11/17/2017 1132   Hepatic Function Panel     Component Value Date/Time   PROT 5.8 (L) 11/17/2017 1132   ALBUMIN 3.9 11/17/2017 1132   AST 10 11/17/2017 1132   ALT 9 11/17/2017 1132   ALKPHOS 60 11/17/2017 1132   BILITOT 0.3 11/17/2017 1132      Component Value Date/Time   TSH 1.500 10/08/2016 1025   TSH 1.630 06/25/2016 1053   Results for SIARRA, GILKERSON (MRN [id number]) as of 06/10/2018 10:12  Ref. Range 11/17/2017 11:32  Vitamin D, 25-Hydroxy Latest Ref Range: 30.0 - 100.0 ng/mL 60.7     I, Marcille Blanco, CMA, am acting as transcriptionist for Starlyn Skeans, MD I have reviewed the above documentation for accuracy and completeness, and I agree with the above. -Dennard Nip, MD

## 2018-07-01 ENCOUNTER — Other Ambulatory Visit: Payer: Self-pay

## 2018-07-01 ENCOUNTER — Ambulatory Visit (INDEPENDENT_AMBULATORY_CARE_PROVIDER_SITE_OTHER): Payer: BC Managed Care – PPO | Admitting: Family Medicine

## 2018-07-01 ENCOUNTER — Encounter (INDEPENDENT_AMBULATORY_CARE_PROVIDER_SITE_OTHER): Payer: Self-pay | Admitting: Family Medicine

## 2018-07-01 DIAGNOSIS — F3289 Other specified depressive episodes: Secondary | ICD-10-CM | POA: Diagnosis not present

## 2018-07-01 DIAGNOSIS — E559 Vitamin D deficiency, unspecified: Secondary | ICD-10-CM

## 2018-07-01 DIAGNOSIS — Z6835 Body mass index (BMI) 35.0-35.9, adult: Secondary | ICD-10-CM

## 2018-07-01 MED ORDER — VITAMIN D (ERGOCALCIFEROL) 1.25 MG (50000 UNIT) PO CAPS: ORAL_CAPSULE | ORAL | 0 refills | Status: DC

## 2018-07-01 MED ORDER — BUPROPION HCL ER (SR) 150 MG PO TB12: 150.0000 mg | ORAL_TABLET | Freq: Every day | ORAL | 0 refills | Status: DC

## 2018-07-01 NOTE — Progress Notes (Signed)
Office: [phone]  /  Fax: [phone] TeleHealth Visit:  Grace Horton has verbally consented to this TeleHealth visit today. The patient is located at home, the provider is located at the [facility] office. The participants in this visit include the listed provider and patient. The visit was conducted today via Face Time.  HPI:   Chief Complaint: OBESITY Grace Horton is here to discuss her progress with her obesity treatment plan. She is keeping a food journal with 1200 calories and 70+ grams of protein and is following her eating plan approximately 75 % of the time. She states she is walking and hiking 60 minutes 5 times per week. Grace Horton continues to do well with maintaining her weight. She journals frequently and is working on meeting protein goals. She notes her hunger has started to increase in the last week though.  We were unable to weigh the patient today for this TeleHealth visit. She feels as if she has maintained weight since her last visit. She has lost 23 lbs since starting treatment with [name].  Depression with emotional eating behaviors Grace Horton's mood is stable on Wellbutrin and her fatigue has improved. Her blood pressure is stable. She is struggling with emotional eating and using food for comfort to the extent that it is negatively impacting her health. She often snacks when she is not hungry. Grace Horton sometimes feels she is out of control and then feels guilty that she made poor food choices. She has been working on behavior modification techniques to help reduce her emotional eating and has been somewhat successful. She denies insomnia and palpitations.   Vitamin D Deficiency Grace Horton has a diagnosis of vitamin D deficiency. She is currently on vit D and her last level was at goal. Grace Horton denies nausea, vomiting, or muscle weakness.  ASSESSMENT AND PLAN:  Vitamin D deficiency - Plan: Vitamin D, Ergocalciferol, (DRISDOL) 1.25 MG (50000 UT) CAPS capsule  Other  depression - with emotional eating - Plan: buPROPion (WELLBUTRIN SR) 150 MG 12 hr tablet  Class 2 severe obesity with serious comorbidity and body mass index (BMI) of 35.0 to 35.9 in adult, unspecified obesity type ([place])  PLAN:  Vitamin D Deficiency Grace Horton was informed that low vitamin D levels contribute to fatigue and are associated with obesity, breast, and colon cancer. Levonne agrees to continue to take prescription Vit D @50 ,000 IU every 2 weeks #4 with no refills and will follow up for routine testing of vitamin D, at least 2-3 times per year. She was informed of the risk of over-replacement of vitamin D and agrees to not increase her dose unless she discusses this with [name] first. Grace Horton agrees to follow up in 3 weeks as directed.  Depression with Emotional Eating Behaviors We discussed behavior modification techniques today to help Grace Horton deal with her emotional eating and depression. She has agreed to take Wellbutrin SR 150 mg qAM #30 with no refills and agreed to follow up as directed in 3 weeks.  Obesity Grace Horton is currently in the action stage of change. As such, her goal is to continue with weight loss efforts. She has agreed to keep a food journal with 1200 calories and 70+ grams of protein.  Grace Horton has been instructed to work up to a goal of 150 minutes of combined cardio and strengthening exercise per week for weight loss and overall health benefits. We discussed the following Behavioral Modification Strategies today: increasing lean protein intake, decreasing simple carbohydrates, and work on meal planning and easy cooking  plans. We discussed increasing Saxenda to 5 clicks past 0.6 (approximately 0.9) with no refills needed and we will follow up to monitor her progress.  Grace Horton has agreed to follow up with our clinic in 3 weeks. She was informed of the importance of frequent follow up visits to maximize her success with intensive lifestyle modifications for her multiple health  conditions.  ALLERGIES: No Known Allergies  MEDICATIONS: Current Outpatient Medications on File Prior to Visit  Medication Sig Dispense Refill  . buPROPion (WELLBUTRIN SR) 150 MG 12 hr tablet Take 1 tablet (150 mg total) by mouth daily before breakfast. 30 tablet 0  . Diclofenac Sodium (PENNSAID) 2 % SOLN Fingertip amount to affected area twice daily 1 Bottle 2  . Diclofenac Sodium 2 % SOLN Place 2 g onto the skin 2 (two) times daily. 112 g 3  . fluticasone (FLONASE) 50 MCG/ACT nasal spray 2 sprays in each nostril daily 16 g 0  . Insulin Pen Needle (BD PEN NEEDLE NANO 2ND GEN) 32G X 4 MM MISC 1 Package by Does not apply route 2 (two) times daily. 100 each 0  . levonorgestrel-ethinyl estradiol (AVIANE,ALESSE,LESSINA) 0.1-20 MG-MCG tablet Take 1 tablet by mouth daily.    . Liraglutide -Weight Management (SAXENDA) 18 MG/3ML SOPN Inject 3 mg into the skin daily. 5 pen 0  . Vitamin D, Ergocalciferol, (DRISDOL) 1.25 MG (50000 UT) CAPS capsule TAKE 1 CAPSULE BY MOUTH EVERY 14 DAYS. 4 capsule 0   No current facility-administered medications on file prior to visit.     PAST MEDICAL HISTORY: Past Medical History:  Diagnosis Date  . AC (acromioclavicular) joint bone spurs   . Anxiety   . Back pain   . Chondromalacia   . Depression   . GERD (gastroesophageal reflux disease)   . History of shingles 2011   Around Left Eye  . Joint pain   . Raynaud disease   . Swelling    in feet or legs  . Vitamin D deficiency     PAST SURGICAL HISTORY: Past Surgical History:  Procedure Laterality Date  . CESAREAN SECTION    . SHOULDER SURGERY Left 2007  . TONSILLECTOMY      SOCIAL HISTORY: Social History   Tobacco Use  . Smoking status: Never Smoker  . Smokeless tobacco: Never Used  Substance Use Topics  . Alcohol use: No  . Drug use: No    FAMILY HISTORY: Family History  Problem Relation Age of Onset  . Heart disease Father   . Diabetes Father   . Kidney disease Father        Stage  III   . Transient ischemic attack Father        S/P Carotid endarterectomy  . Hypertension Father   . Hyperlipidemia Father   . Irritable bowel syndrome Mother   . Hypertension Mother   . Hyperlipidemia Mother   . Obesity Mother     ROS: Review of Systems  Cardiovascular: Negative for palpitations.  Gastrointestinal: Negative for nausea and vomiting.  Musculoskeletal:       Negative for muscle weakness.  Psychiatric/Behavioral: Positive for depression. The patient does not have insomnia.     PHYSICAL EXAM: Pt in no acute distress  RECENT LABS AND TESTS: BMET    Component Value Date/Time   NA 142 11/17/2017 1132   K 4.7 11/17/2017 1132   CL 105 11/17/2017 1132   CO2 24 11/17/2017 1132   GLUCOSE 87 11/17/2017 1132   GLUCOSE 83 05/27/2012 1119  BUN 13 11/17/2017 1132   CREATININE 0.66 11/17/2017 1132   CREATININE 0.81 05/27/2012 1119   CALCIUM 8.7 11/17/2017 1132   GFRNONAA 104 11/17/2017 1132   GFRAA 120 11/17/2017 1132   Lab Results  Component Value Date   HGBA1C 5.2 11/17/2017   HGBA1C 5.1 06/11/2017   HGBA1C 5.5 02/17/2017   HGBA1C 5.3 10/08/2016   HGBA1C 5.7 (H) 06/25/2016   Lab Results  Component Value Date   INSULIN 8.8 11/17/2017   INSULIN 6.5 06/11/2017   INSULIN 12.3 02/17/2017   INSULIN 9.7 10/08/2016   INSULIN 12.7 06/25/2016   CBC    Component Value Date/Time   WBC 6.0 11/17/2017 1132   WBC 6.7 05/27/2012 1119   RBC 4.12 11/17/2017 1132   RBC 4.46 05/27/2012 1119   HGB 12.8 11/17/2017 1132   HCT 37.7 11/17/2017 1132   PLT 317 05/27/2012 1119   MCV 92 11/17/2017 1132   MCH 31.1 11/17/2017 1132   MCH 30.7 05/27/2012 1119   MCHC 34.0 11/17/2017 1132   MCHC 33.7 05/27/2012 1119   RDW 12.2 (L) 11/17/2017 1132   LYMPHSABS 1.9 11/17/2017 1132   MONOABS 0.4 05/27/2012 1119   EOSABS 0.1 11/17/2017 1132   BASOSABS 0.0 11/17/2017 1132   Iron/TIBC/Ferritin/ %Sat No results found for: IRON, TIBC, FERRITIN, IRONPCTSAT Lipid Panel      Component Value Date/Time   CHOL 161 11/17/2017 1132   TRIG 50 11/17/2017 1132   HDL 41 11/17/2017 1132   LDLCALC 110 (H) 11/17/2017 1132   Hepatic Function Panel     Component Value Date/Time   PROT 5.8 (L) 11/17/2017 1132   ALBUMIN 3.9 11/17/2017 1132   AST 10 11/17/2017 1132   ALT 9 11/17/2017 1132   ALKPHOS 60 11/17/2017 1132   BILITOT 0.3 11/17/2017 1132      Component Value Date/Time   TSH 1.500 10/08/2016 1025   TSH 1.630 06/25/2016 1053   Results for SHARAE, ZAPPULLA (MRN [id number]) as of 07/01/2018 14:13  Ref. Range 11/17/2017 11:32  Vitamin D, 25-Hydroxy Latest Ref Range: 30.0 - 100.0 ng/mL 60.7     I, Marcille Blanco, CMA, am acting as transcriptionist for Starlyn Skeans, MD I have reviewed the above documentation for accuracy and completeness, and I agree with the above. -Dennard Nip, MD

## 2018-07-22 ENCOUNTER — Ambulatory Visit (INDEPENDENT_AMBULATORY_CARE_PROVIDER_SITE_OTHER): Payer: BC Managed Care – PPO | Admitting: Family Medicine

## 2018-07-22 ENCOUNTER — Encounter (INDEPENDENT_AMBULATORY_CARE_PROVIDER_SITE_OTHER): Payer: Self-pay | Admitting: Family Medicine

## 2018-07-22 ENCOUNTER — Other Ambulatory Visit: Payer: Self-pay

## 2018-07-22 DIAGNOSIS — E559 Vitamin D deficiency, unspecified: Secondary | ICD-10-CM | POA: Diagnosis not present

## 2018-07-22 DIAGNOSIS — F3289 Other specified depressive episodes: Secondary | ICD-10-CM | POA: Diagnosis not present

## 2018-07-22 DIAGNOSIS — Z6837 Body mass index (BMI) 37.0-37.9, adult: Secondary | ICD-10-CM

## 2018-07-22 DIAGNOSIS — R7303 Prediabetes: Secondary | ICD-10-CM

## 2018-07-22 MED ORDER — SAXENDA 18 MG/3ML ~~LOC~~ SOPN: 3.0000 mg | PEN_INJECTOR | Freq: Every day | SUBCUTANEOUS | 0 refills | Status: DC

## 2018-07-22 MED ORDER — VITAMIN D (ERGOCALCIFEROL) 1.25 MG (50000 UNIT) PO CAPS: ORAL_CAPSULE | ORAL | 0 refills | Status: DC

## 2018-07-22 MED ORDER — BUPROPION HCL ER (SR) 150 MG PO TB12: 150.0000 mg | ORAL_TABLET | Freq: Every day | ORAL | 0 refills | Status: DC

## 2018-07-26 MED ORDER — BD PEN NEEDLE NANO 2ND GEN 32G X 4 MM MISC: 1.0000 | Freq: Two times a day (BID) | 0 refills | Status: DC

## 2018-07-26 NOTE — Progress Notes (Signed)
Office: [phone]  /  Fax: [phone] TeleHealth Visit:  Malajah Oceguera has verbally consented to this TeleHealth visit today. The patient is located at home, the provider is located at the [employer] [facility] office. The participants in this visit include the listed provider and patient and any and all parties involved. The visit was conducted today via FaceTime.  HPI:   Chief Complaint: OBESITY Grace Horton is here to discuss her progress with her obesity treatment plan. She is on the keep a food journal with 1200 calories and 70+ grams of protein daily plan and is following her eating plan approximately 75 % of the time. She states she is walking/videos 15 to 20 minutes 3 to 4 days per week. Grace Horton is doing well with maintaining her weight. She is journaling more often than not and she is doing well with meeting her calorie goal, but not her protein goal. She feels her [name] is helping her hunger. We were unable to weigh the patient today for this TeleHealth visit. She feels as if she has maintained weight since her last visit. She has lost 23 lbs since starting treatment with [name].  Vitamin D deficiency Grace Horton has a diagnosis of vitamin D deficiency. Grace Horton is is stable on vit D and she denies nausea, vomiting or muscle weakness.  Pre-Diabetes Grace Horton has a diagnosis of prediabetes based on her elevated Hgb A1c and was informed this puts her at greater risk of developing diabetes. Grace Horton is on Saxenda and she is working on diet, exercise and weight loss  to decrease risk of diabetes. She denies nausea, vomiting or hypoglycemia.  Depression with emotional eating behaviors Grace Horton is struggling with emotional eating and using food for comfort to the extent that it is negatively impacting her health. She often snacks when she is not hungry. Grace Horton sometimes feels she is out of control and then feels guilty that she made poor food choices. She has been working on behavior modification  techniques to help reduce her emotional eating and has been somewhat successful. She shows no sign of suicidal or homicidal ideations.  Depression screen [facility] 2/9 06/25/2016  Decreased Interest 3  Down, Depressed, Hopeless 1  PHQ - 2 Score 4  Altered sleeping 0  Tired, decreased energy 3  Change in appetite 2  Feeling bad or failure about yourself  1  Trouble concentrating 1  Moving slowly or fidgety/restless 3  Suicidal thoughts 0  PHQ-9 Score 14    ASSESSMENT AND PLAN:  Vitamin D deficiency - Plan: Vitamin D, Ergocalciferol, (DRISDOL) 1.25 MG (50000 UT) CAPS capsule  Prediabetes  Other depression - with emotional eating - Plan: buPROPion (WELLBUTRIN SR) 150 MG 12 hr tablet  Class 2 severe obesity with serious comorbidity and body mass index (BMI) of 37.0 to 37.9 in adult, unspecified obesity type (HCC) - Plan: Liraglutide -Weight Management (SAXENDA) 18 MG/3ML SOPN, Insulin Pen Needle (BD PEN NEEDLE NANO 2ND GEN) 32G X 4 MM MISC  PLAN:  Vitamin D Deficiency Grace Horton was informed that low vitamin D levels contributes to fatigue and are associated with obesity, breast, and colon cancer. She agrees to continue to take prescription Vit D @50 ,000 IU every 14 days #2 with no refills  and will follow up for routine testing of vitamin D, at least 2-3 times per year. She was informed of the risk of over-replacement of vitamin D and agrees to not increase her dose unless she discusses this with [name] first. [name] agrees to follow up as  directed.  Pre-Diabetes Grace Horton will continue to work on diet, weight loss, exercise, and decreasing simple carbohydrates in her diet to help decrease the risk of diabetes. We dicussed metformin including benefits and risks. She was informed that eating too many simple carbohydrates or too many calories at one sitting increases the likelihood of GI side effects. Grace Horton agrees to continue [name] and we will check labs at the next visit. Grace Horton agrees to follow up with  [name] as directed to monitor her progress.  Depression with Emotional Eating Behaviors We discussed behavior modification techniques today to help Grace Horton deal with her emotional eating and depression. She has agreed to continue Wellbutrin SR 150 mg daily #30 with no refills and continue to monitor blood pressure, and we will check blood pressure in the office as well in 2 weeks. Grace Horton agreed to follow up as directed.  Obesity Grace Horton is currently in the action stage of change. As such, her goal is to continue with weight loss efforts She has agreed to keep a food journal with 1200 calories and 70+ grams of protein daily Grace Horton has been instructed to work up to a goal of 150 minutes of combined cardio and strengthening exercise per week for weight loss and overall health benefits. We discussed the following Behavioral Modification Strategies today: increasing lean protein intake, keeping healthy foods in the home and better snacking choices Grace Horton agrees to continue Saxenda 3 mg subQ daily #5 pens and nano needles #100 with no refills.   Grace Horton has agreed to follow up with our clinic in 2 to 3 weeks. She was informed of the importance of frequent follow up visits to maximize her success with intensive lifestyle modifications for her multiple health conditions.  ALLERGIES: No Known Allergies  MEDICATIONS: Current Outpatient Medications on File Prior to Visit  Medication Sig Dispense Refill   Diclofenac Sodium 2 % SOLN Place 2 g onto the skin 2 (two) times daily. 112 g 3   ergocalciferol (VITAMIN D2) 1.25 MG (50000 UT) capsule Take 50,000 Units by mouth every 14 (fourteen) days.     fluticasone (FLONASE) 50 MCG/ACT nasal spray 2 sprays in each nostril daily 16 g 0   levonorgestrel-ethinyl estradiol (AVIANE,ALESSE,LESSINA) 0.1-20 MG-MCG tablet Take 1 tablet by mouth daily.     No current facility-administered medications on file prior to visit.     PAST MEDICAL HISTORY: Past Medical  History:  Diagnosis Date   AC (acromioclavicular) joint bone spurs    Anxiety    Back pain    Chondromalacia    Depression    GERD (gastroesophageal reflux disease)    History of shingles 2011   Around Left Eye   Joint pain    Raynaud disease    Swelling    in feet or legs   Vitamin D deficiency     PAST SURGICAL HISTORY: Past Surgical History:  Procedure Laterality Date   CESAREAN SECTION     SHOULDER SURGERY Left 2007   TONSILLECTOMY      SOCIAL HISTORY: Social History   Tobacco Use   Smoking status: Never Smoker   Smokeless tobacco: Never Used  Substance Use Topics   Alcohol use: No   Drug use: No    FAMILY HISTORY: Family History  Problem Relation Age of Onset   Heart disease Father    Diabetes Father    Kidney disease Father        Stage III    Transient ischemic attack Father  S/P Carotid endarterectomy   Hypertension Father    Hyperlipidemia Father    Irritable bowel syndrome Mother    Hypertension Mother    Hyperlipidemia Mother    Obesity Mother     ROS: Review of Systems  Constitutional: Negative for weight loss.  Gastrointestinal: Negative for nausea and vomiting.  Musculoskeletal:       Negative for muscle weakness  Endo/Heme/Allergies:       Negative for hypoglycemia  Psychiatric/Behavioral: Positive for depression. Negative for suicidal ideas.    PHYSICAL EXAM: Pt in no acute distress  RECENT LABS AND TESTS: BMET    Component Value Date/Time   NA 142 11/17/2017 1132   K 4.7 11/17/2017 1132   CL 105 11/17/2017 1132   CO2 24 11/17/2017 1132   GLUCOSE 87 11/17/2017 1132   GLUCOSE 83 05/27/2012 1119   BUN 13 11/17/2017 1132   CREATININE 0.66 11/17/2017 1132   CREATININE 0.81 05/27/2012 1119   CALCIUM 8.7 11/17/2017 1132   GFRNONAA 104 11/17/2017 1132   GFRAA 120 11/17/2017 1132   Lab Results  Component Value Date   HGBA1C 5.2 11/17/2017   HGBA1C 5.1 06/11/2017   HGBA1C 5.5 02/17/2017    HGBA1C 5.3 10/08/2016   HGBA1C 5.7 (H) 06/25/2016   Lab Results  Component Value Date   INSULIN 8.8 11/17/2017   INSULIN 6.5 06/11/2017   INSULIN 12.3 02/17/2017   INSULIN 9.7 10/08/2016   INSULIN 12.7 06/25/2016   CBC    Component Value Date/Time   WBC 6.0 11/17/2017 1132   WBC 6.7 05/27/2012 1119   RBC 4.12 11/17/2017 1132   RBC 4.46 05/27/2012 1119   HGB 12.8 11/17/2017 1132   HCT 37.7 11/17/2017 1132   PLT 317 05/27/2012 1119   MCV 92 11/17/2017 1132   MCH 31.1 11/17/2017 1132   MCH 30.7 05/27/2012 1119   MCHC 34.0 11/17/2017 1132   MCHC 33.7 05/27/2012 1119   RDW 12.2 (L) 11/17/2017 1132   LYMPHSABS 1.9 11/17/2017 1132   MONOABS 0.4 05/27/2012 1119   EOSABS 0.1 11/17/2017 1132   BASOSABS 0.0 11/17/2017 1132   Iron/TIBC/Ferritin/ %Sat No results found for: IRON, TIBC, FERRITIN, IRONPCTSAT Lipid Panel     Component Value Date/Time   CHOL 161 11/17/2017 1132   TRIG 50 11/17/2017 1132   HDL 41 11/17/2017 1132   LDLCALC 110 (H) 11/17/2017 1132   Hepatic Function Panel     Component Value Date/Time   PROT 5.8 (L) 11/17/2017 1132   ALBUMIN 3.9 11/17/2017 1132   AST 10 11/17/2017 1132   ALT 9 11/17/2017 1132   ALKPHOS 60 11/17/2017 1132   BILITOT 0.3 11/17/2017 1132      Component Value Date/Time   TSH 1.500 10/08/2016 1025   TSH 1.630 06/25/2016 1053     Ref. Range 11/17/2017 11:32  Vitamin D, 25-Hydroxy Latest Ref Range: 30.0 - 100.0 ng/mL 60.7    I, Doreene Nest, am acting as Location manager for Dennard Nip, MD I have reviewed the above documentation for accuracy and completeness, and I agree with the above. -Dennard Nip, MD

## 2018-08-17 ENCOUNTER — Ambulatory Visit (INDEPENDENT_AMBULATORY_CARE_PROVIDER_SITE_OTHER): Payer: BC Managed Care – PPO | Admitting: Family Medicine

## 2018-08-24 ENCOUNTER — Ambulatory Visit (INDEPENDENT_AMBULATORY_CARE_PROVIDER_SITE_OTHER): Payer: BC Managed Care – PPO | Admitting: Family Medicine

## 2018-08-24 ENCOUNTER — Encounter (INDEPENDENT_AMBULATORY_CARE_PROVIDER_SITE_OTHER): Payer: Self-pay | Admitting: Family Medicine

## 2018-08-24 ENCOUNTER — Other Ambulatory Visit: Payer: Self-pay

## 2018-08-24 VITALS — BP 144/85 | HR 72 | Temp 98.1°F | Ht 62.0 in | Wt 196.0 lb

## 2018-08-24 DIAGNOSIS — Z9189 Other specified personal risk factors, not elsewhere classified: Secondary | ICD-10-CM

## 2018-08-24 DIAGNOSIS — F3289 Other specified depressive episodes: Secondary | ICD-10-CM | POA: Diagnosis not present

## 2018-08-24 DIAGNOSIS — Z6836 Body mass index (BMI) 36.0-36.9, adult: Secondary | ICD-10-CM

## 2018-08-24 DIAGNOSIS — E559 Vitamin D deficiency, unspecified: Secondary | ICD-10-CM | POA: Diagnosis not present

## 2018-08-24 MED ORDER — BUPROPION HCL ER (SR) 150 MG PO TB12: 150.0000 mg | ORAL_TABLET | Freq: Every day | ORAL | 0 refills | Status: DC

## 2018-08-24 MED ORDER — VITAMIN D (ERGOCALCIFEROL) 1.25 MG (50000 UNIT) PO CAPS: ORAL_CAPSULE | ORAL | 0 refills | Status: DC

## 2018-08-24 NOTE — Progress Notes (Signed)
Office: [phone]  /  Fax: [phone]   HPI:   Chief Complaint: OBESITY Grace Horton is here to discuss her progress with her obesity treatment plan. She is on the  keep a food journal with 1200 calories and 70+g of protein  daily and is following her eating plan approximately 50 % of the time. She states she is exercising 0 minutes 0 times per week. Shatara has done well with maintaining her weight since our last in office visit >4 months ago. She has not journaled regularly but on and off. She thinks she would do better on a more structured plan.  Her weight is 196 lb (88.9 kg) today and has not lost weight since her last visit. She has lost 23 lbs since starting treatment with [name].   Vitamin D deficiency Grace Horton has a diagnosis of vitamin D deficiency. She is currently taking vit D and denies nausea, vomiting or muscle weakness. She is due for repeat labs soon.   Depression with emotional eating behaviors Grace Horton is struggling with emotional eating and using food for comfort to the extent that it is negatively impacting her health. She often snacks when she is not hungry. Grace Horton sometimes feels she is out of control and then feels guilty that she made poor food choices. She has been working on behavior modification techniques to help reduce her emotional eating and has been somewhat successful. Her mood is stable on the medications. She is sleeping well and feels more in control of her emotional eating. She shows no sign of suicidal or homicidal ideations.  Depression screen PHQ 2/9 06/25/2016  Decreased Interest 3  Down, Depressed, Hopeless 1  PHQ - 2 Score 4  Altered sleeping 0  Tired, decreased energy 3  Change in appetite 2  Feeling bad or failure about yourself  1  Trouble concentrating 1  Moving slowly or fidgety/restless 3  Suicidal thoughts 0  PHQ-9 Score 14   At risk for osteopenia and osteoporosis [name] is at higher risk of osteopenia and osteoporosis due to vitamin D  deficiency.   ASSESSMENT AND PLAN:  Vitamin D deficiency - Plan: Vitamin D, Ergocalciferol, (DRISDOL) 1.25 MG (50000 UT) CAPS capsule  Other depression - with emotional eating - Plan: buPROPion (WELLBUTRIN SR) 150 MG 12 hr tablet  At risk for osteoporosis  Class 2 severe obesity with serious comorbidity and body mass index (BMI) of 36.0 to 36.9 in adult, unspecified obesity type ([place])  PLAN: Vitamin D Deficiency [name] was informed that low vitamin D levels contributes to fatigue and are associated with obesity, breast, and colon cancer. She agrees to continue to take prescription Vit D @50 ,000 IU every week #4 with no refills sent today and will follow up for routine testing of vitamin D, at least 2-3 times per year. She was informed of the risk of over-replacement of vitamin D and agrees to not increase her dose unless she discusses this with [name] first. Agrees to follow up with our clinic as directed. We will repeat labs at the next visit.   Depression with Emotional Eating Behaviors We discussed behavior modification techniques today to help Grace Horton deal with her emotional eating and depression. She has agreed to continue to take Wellbutrin SR 150 mg qd #30 with no refills sent today and agreed to follow up as directed.  At risk for osteopenia and osteoporosis Grace Horton was given extended  (15 minutes) osteoporosis prevention counseling today. Grace Horton is at risk for osteopenia and osteoporsis due to her vitamin  D deficiency. She was encouraged to take her vitamin D and follow her higher calcium diet and increase strengthening exercise to help strengthen her bones and decrease her risk of osteopenia and osteoporosis.  Obesity Grace Horton is currently in the action stage of change. As such, her goal is to continue with weight loss efforts She has agreed to change to a Paleo plan.  [name] has been instructed to work up to a goal of 150 minutes of combined cardio and strengthening exercise per week  for weight loss and overall health benefits. We discussed the following Behavioral Modification Stratagies today: increasing lean protein intake, work on meal planning and easy cooking plans and emotional eating strategies   Grace Horton has agreed to follow up with our clinic in 3 weeks. She was informed of the importance of frequent follow up visits to maximize her success with intensive lifestyle modifications for her multiple health conditions.  ALLERGIES: No Known Allergies  MEDICATIONS: Current Outpatient Medications on File Prior to Visit  Medication Sig Dispense Refill  . buPROPion (WELLBUTRIN SR) 150 MG 12 hr tablet Take 1 tablet (150 mg total) by mouth daily before breakfast. 30 tablet 0  . Diclofenac Sodium 2 % SOLN Place 2 g onto the skin 2 (two) times daily. 112 g 3  . fluticasone (FLONASE) 50 MCG/ACT nasal spray 2 sprays in each nostril daily 16 g 0  . Insulin Pen Needle (BD PEN NEEDLE NANO 2ND GEN) 32G X 4 MM MISC 1 Package by Does not apply route 2 (two) times daily. 100 each 0  . levonorgestrel-ethinyl estradiol (AVIANE,ALESSE,LESSINA) 0.1-20 MG-MCG tablet Take 1 tablet by mouth daily.    . Liraglutide -Weight Management (SAXENDA) 18 MG/3ML SOPN Inject 3 mg into the skin daily. 5 pen 0  . Vitamin D, Ergocalciferol, (DRISDOL) 1.25 MG (50000 UT) CAPS capsule TAKE 1 CAPSULE BY MOUTH EVERY 14 DAYS. 2 capsule 0   No current facility-administered medications on file prior to visit.     PAST MEDICAL HISTORY: Past Medical History:  Diagnosis Date  . AC (acromioclavicular) joint bone spurs   . Anxiety   . Back pain   . Chondromalacia   . Depression   . GERD (gastroesophageal reflux disease)   . History of shingles 2011   Around Left Eye  . Joint pain   . Raynaud disease   . Swelling    in feet or legs  . Vitamin D deficiency     PAST SURGICAL HISTORY: Past Surgical History:  Procedure Laterality Date  . CESAREAN SECTION    . SHOULDER SURGERY Left 2007  . TONSILLECTOMY       SOCIAL HISTORY: Social History   Tobacco Use  . Smoking status: Never Smoker  . Smokeless tobacco: Never Used  Substance Use Topics  . Alcohol use: No  . Drug use: No    FAMILY HISTORY: Family History  Problem Relation Age of Onset  . Heart disease Father   . Diabetes Father   . Kidney disease Father        Stage III   . Transient ischemic attack Father        S/P Carotid endarterectomy  . Hypertension Father   . Hyperlipidemia Father   . Irritable bowel syndrome Mother   . Hypertension Mother   . Hyperlipidemia Mother   . Obesity Mother     ROS: Review of Systems  Constitutional: Negative for weight loss.  Gastrointestinal: Negative for nausea and vomiting.  Musculoskeletal:  Negative for muscle weakness  Psychiatric/Behavioral: Positive for depression. Negative for suicidal ideas.    PHYSICAL EXAM: Blood pressure (!) 144/85, pulse 72, temperature 98.1 F (36.7 C), temperature source Oral, height 5\' 2"  (1.575 m), weight 196 lb (88.9 kg), SpO2 97 %. Body mass index is 35.85 kg/m. Physical Exam Vitals signs reviewed.  Constitutional:      Appearance: Normal appearance. She is obese.  HENT:     Head: Normocephalic.     Nose: Nose normal.  Neck:     Musculoskeletal: Normal range of motion.  Cardiovascular:     Rate and Rhythm: Normal rate.  Pulmonary:     Effort: Pulmonary effort is normal.  Musculoskeletal: Normal range of motion.  Skin:    General: Skin is warm and dry.  Neurological:     Mental Status: She is alert and oriented to person, place, and time.  Psychiatric:        Mood and Affect: Mood normal.        Behavior: Behavior normal.     RECENT LABS AND TESTS: BMET    Component Value Date/Time   NA 142 11/17/2017 1132   K 4.7 11/17/2017 1132   CL 105 11/17/2017 1132   CO2 24 11/17/2017 1132   GLUCOSE 87 11/17/2017 1132   GLUCOSE 83 05/27/2012 1119   BUN 13 11/17/2017 1132   CREATININE 0.66 11/17/2017 1132   CREATININE 0.81  05/27/2012 1119   CALCIUM 8.7 11/17/2017 1132   GFRNONAA 104 11/17/2017 1132   GFRAA 120 11/17/2017 1132   Lab Results  Component Value Date   HGBA1C 5.2 11/17/2017   HGBA1C 5.1 06/11/2017   HGBA1C 5.5 02/17/2017   HGBA1C 5.3 10/08/2016   HGBA1C 5.7 (H) 06/25/2016   Lab Results  Component Value Date   INSULIN 8.8 11/17/2017   INSULIN 6.5 06/11/2017   INSULIN 12.3 02/17/2017   INSULIN 9.7 10/08/2016   INSULIN 12.7 06/25/2016   CBC    Component Value Date/Time   WBC 6.0 11/17/2017 1132   WBC 6.7 05/27/2012 1119   RBC 4.12 11/17/2017 1132   RBC 4.46 05/27/2012 1119   HGB 12.8 11/17/2017 1132   HCT 37.7 11/17/2017 1132   PLT 317 05/27/2012 1119   MCV 92 11/17/2017 1132   MCH 31.1 11/17/2017 1132   MCH 30.7 05/27/2012 1119   MCHC 34.0 11/17/2017 1132   MCHC 33.7 05/27/2012 1119   RDW 12.2 (L) 11/17/2017 1132   LYMPHSABS 1.9 11/17/2017 1132   MONOABS 0.4 05/27/2012 1119   EOSABS 0.1 11/17/2017 1132   BASOSABS 0.0 11/17/2017 1132   Iron/TIBC/Ferritin/ %Sat No results found for: IRON, TIBC, FERRITIN, IRONPCTSAT Lipid Panel     Component Value Date/Time   CHOL 161 11/17/2017 1132   TRIG 50 11/17/2017 1132   HDL 41 11/17/2017 1132   LDLCALC 110 (H) 11/17/2017 1132   Hepatic Function Panel     Component Value Date/Time   PROT 5.8 (L) 11/17/2017 1132   ALBUMIN 3.9 11/17/2017 1132   AST 10 11/17/2017 1132   ALT 9 11/17/2017 1132   ALKPHOS 60 11/17/2017 1132   BILITOT 0.3 11/17/2017 1132      Component Value Date/Time   TSH 1.500 10/08/2016 1025   TSH 1.630 06/25/2016 1053     Ref. Range 11/17/2017 11:32  Vitamin D, 25-Hydroxy Latest Ref Range: 30.0 - 100.0 ng/mL 60.7     OBESITY BEHAVIORAL INTERVENTION VISIT  Today's visit was # 39   Starting weight: 219 lbs Starting date:  06/25/16 Today's weight : Weight: 196 lb (88.9 kg)  Today's date: 08/24/2018 Total lbs lost to date: 32    ASK: We discussed the diagnosis of obesity with Alben Spittle today  and Ebonique agreed to give [name] permission to discuss obesity behavioral modification therapy today.  ASSESS: Nolene has the diagnosis of obesity and her BMI today is 38.9 Breeanne is in the action stage of change   ADVISE: Muranda was educated on the multiple health risks of obesity as well as the benefit of weight loss to improve her health. She was advised of the need for long term treatment and the importance of lifestyle modifications to improve her current health and to decrease her risk of future health problems.  AGREE: Multiple dietary modification options and treatment options were discussed and  Regenia agreed to follow the recommendations documented in the above note.  ARRANGE: Shandon was educated on the importance of frequent visits to treat obesity as outlined per CMS and USPSTF guidelines and agreed to schedule her next follow up appointment today.  I, Renee Ramus, am acting as transcriptionist for Dennard Nip, MD   I have reviewed the above documentation for accuracy and completeness, and I agree with the above. -Dennard Nip, MD

## 2018-09-14 ENCOUNTER — Ambulatory Visit (INDEPENDENT_AMBULATORY_CARE_PROVIDER_SITE_OTHER): Payer: BC Managed Care – PPO | Admitting: Family Medicine

## 2018-09-23 ENCOUNTER — Other Ambulatory Visit (INDEPENDENT_AMBULATORY_CARE_PROVIDER_SITE_OTHER): Payer: Self-pay | Admitting: Family Medicine

## 2018-09-23 DIAGNOSIS — F3289 Other specified depressive episodes: Secondary | ICD-10-CM

## 2018-09-27 ENCOUNTER — Ambulatory Visit (INDEPENDENT_AMBULATORY_CARE_PROVIDER_SITE_OTHER): Payer: BC Managed Care – PPO | Admitting: Family Medicine

## 2018-09-28 ENCOUNTER — Encounter (INDEPENDENT_AMBULATORY_CARE_PROVIDER_SITE_OTHER): Payer: Self-pay | Admitting: Family Medicine

## 2018-09-28 ENCOUNTER — Ambulatory Visit (INDEPENDENT_AMBULATORY_CARE_PROVIDER_SITE_OTHER): Payer: BC Managed Care – PPO | Admitting: Family Medicine

## 2018-09-28 ENCOUNTER — Other Ambulatory Visit: Payer: Self-pay

## 2018-09-28 VITALS — BP 126/74 | HR 74 | Temp 98.3°F | Ht 62.0 in | Wt 192.0 lb

## 2018-09-28 DIAGNOSIS — E7849 Other hyperlipidemia: Secondary | ICD-10-CM | POA: Diagnosis not present

## 2018-09-28 DIAGNOSIS — Z9189 Other specified personal risk factors, not elsewhere classified: Secondary | ICD-10-CM | POA: Diagnosis not present

## 2018-09-28 DIAGNOSIS — F3289 Other specified depressive episodes: Secondary | ICD-10-CM | POA: Diagnosis not present

## 2018-09-28 DIAGNOSIS — E8881 Metabolic syndrome: Secondary | ICD-10-CM | POA: Diagnosis not present

## 2018-09-28 DIAGNOSIS — Z6835 Body mass index (BMI) 35.0-35.9, adult: Secondary | ICD-10-CM

## 2018-09-28 DIAGNOSIS — E559 Vitamin D deficiency, unspecified: Secondary | ICD-10-CM

## 2018-09-28 MED ORDER — VITAMIN D (ERGOCALCIFEROL) 1.25 MG (50000 UNIT) PO CAPS: ORAL_CAPSULE | ORAL | 0 refills | Status: DC

## 2018-09-28 MED ORDER — BUPROPION HCL ER (SR) 150 MG PO TB12: 150.0000 mg | ORAL_TABLET | Freq: Every day | ORAL | 0 refills | Status: DC

## 2018-09-29 LAB — COMPREHENSIVE METABOLIC PANEL
ALT: 15 IU/L (ref 0–32)
AST: 16 IU/L (ref 0–40)
Albumin/Globulin Ratio: 2.4 — ABNORMAL HIGH (ref 1.2–2.2)
Albumin: 4.4 g/dL (ref 3.8–4.8)
Alkaline Phosphatase: 62 IU/L (ref 39–117)
BUN/Creatinine Ratio: 16 (ref 9–23)
BUN: 14 mg/dL (ref 6–24)
Bilirubin Total: 0.3 mg/dL (ref 0.0–1.2)
CO2: 23 mmol/L (ref 20–29)
Calcium: 8.7 mg/dL (ref 8.7–10.2)
Chloride: 101 mmol/L (ref 96–106)
Creatinine, Ser: 0.85 mg/dL (ref 0.57–1.00)
GFR calc Af Amer: 92 mL/min/{1.73_m2} (ref >59)
GFR calc non Af Amer: 80 mL/min/{1.73_m2} (ref >59)
Globulin, Total: 1.8 g/dL (ref 1.5–4.5)
Glucose: 80 mg/dL (ref 65–99)
Potassium: 4.7 mmol/L (ref 3.5–5.2)
Sodium: 139 mmol/L (ref 134–144)
Total Protein: 6.2 g/dL (ref 6.0–8.5)

## 2018-09-29 LAB — LIPID PANEL WITH LDL/HDL RATIO
Cholesterol, Total: 173 mg/dL (ref 100–199)
HDL: 44 mg/dL (ref >39)
LDL Calculated: 118 mg/dL — ABNORMAL HIGH (ref 0–99)
LDl/HDL Ratio: 2.7 ratio (ref 0.0–3.2)
Triglycerides: 55 mg/dL (ref 0–149)
VLDL Cholesterol Cal: 11 mg/dL (ref 5–40)

## 2018-09-29 LAB — T4, FREE: Free T4: 1.15 ng/dL (ref 0.82–1.77)

## 2018-09-29 LAB — HEMOGLOBIN A1C
Est. average glucose Bld gHb Est-mCnc: 97 mg/dL
Hgb A1c MFr Bld: 5 % (ref 4.8–5.6)

## 2018-09-29 LAB — INSULIN, RANDOM: INSULIN: 13.9 u[IU]/mL (ref 2.6–24.9)

## 2018-09-29 LAB — TSH: TSH: 2.33 u[IU]/mL (ref 0.450–4.500)

## 2018-09-29 LAB — T3: T3, Total: 165 ng/dL (ref 71–180)

## 2018-09-29 LAB — VITAMIN D 25 HYDROXY (VIT D DEFICIENCY, FRACTURES): Vit D, 25-Hydroxy: 91 ng/mL (ref 30.0–100.0)

## 2018-09-29 NOTE — Progress Notes (Signed)
Office: [phone]  /  Fax: [phone]   HPI:   Chief Complaint: OBESITY Grace Horton is here to discuss her progress with her obesity treatment plan. She is on the lower carbohydrate, vegetable and lean protein rich diet plan and is following her eating plan approximately 75 % of the time. She states she is walking 15 to 60 minutes 3 to 4 times per week. Grace Horton has done well with weight loss on her modified lower carbohydrate plan, and hunger is controlled. Grace Horton did go off track while on vacation, but she was able to get back on track. Her weight is 192 lb (87.1 kg) today and has had a weight loss of 4 pounds over a period of 5 weeks since her last visit. She has lost 27 lbs since starting treatment with [name].  Insulin Resistance Grace Horton has a diagnosis of insulin resistance based on her elevated fasting insulin level >5. Although Grace Horton's blood glucose readings are still under good control, insulin resistance puts her at greater risk of metabolic syndrome and diabetes. Grace Horton is not taking metformin currently and she is attempting to control her insulin resistance with diet. Her last labs are doing well. Grace Horton continues to work on diet and exercise to decrease risk of diabetes. Grace Horton denies nausea, vomiting or hypoglycemia.  Hyperlipidemia Grace Horton has hyperlipidemia and she is attempting to control her cholesterol levels with intensive lifestyle modification including a low saturated fat diet, exercise and weight loss. She denies any chest pain. Grace Horton is due for labs.  At risk for cardiovascular disease Grace Horton is at a higher than average risk for cardiovascular disease due to obesity, hyperlipidemia and insulin resistance. She currently denies any chest pain.  Vitamin D deficiency Grace Horton has a diagnosis of vitamin D deficiency. Grace Horton is currently taking vit D and her last level was at goal. She is due for labs. Grace Horton denies nausea, vomiting or muscle weakness.  Depression with emotional  eating behaviors Grace Horton is struggling with emotional eating and using food for comfort to the extent that it is negatively impacting her health. She often snacks when she is not hungry. Grace Horton sometimes feels she is out of control and then feels guilty that she made poor food choices. Her mood is stable on Wellbutrin. She has been working on behavior modification techniques to help reduce her emotional eating and has been somewhat successful. She shows no sign of suicidal or homicidal ideations.  ASSESSMENT AND PLAN:  Insulin resistance - Plan: Comprehensive metabolic panel, Hemoglobin A1c, Insulin, random, T3, T4, free, TSH  Other hyperlipidemia - Plan: Lipid Panel With LDL/HDL Ratio  Vitamin D deficiency - Plan: VITAMIN D 25 Hydroxy (Vit-D Deficiency, Fractures), Vitamin D, Ergocalciferol, (DRISDOL) 1.25 MG (50000 UT) CAPS capsule  Other depression - Plan: buPROPion (WELLBUTRIN SR) 150 MG 12 hr tablet  At risk for heart disease  Class 2 severe obesity with serious comorbidity and body mass index (BMI) of 35.0 to 35.9 in adult, unspecified obesity type (HCC)  Other depression - with emotional eating - Plan: buPROPion (WELLBUTRIN SR) 150 MG 12 hr tablet  PLAN:  Insulin Resistance Grace Horton will continue to work on weight loss, exercise, and decreasing simple carbohydrates in her diet to help decrease the risk of diabetes. She was informed that eating too many simple carbohydrates or too many calories at one sitting increases the likelihood of GI side effects. We will check labs and Grace Horton agreed to follow up with [name] as directed to monitor her progress.  Hyperlipidemia Grace Horton was informed  of the American Heart Association Guidelines emphasizing intensive lifestyle modifications as the first line treatment for hyperlipidemia. We discussed many lifestyle modifications today in depth, and Grace Horton will continue to work on decreasing saturated fats such as fatty red meat, butter and many fried foods.  She will also increase vegetables and lean protein in her diet and continue to work on exercise and weight loss efforts. We will check labs and follow. Grace Horton agrees to follow up at the agreed upon time.  Cardiovascular risk counseling Grace Horton was given extended (15 minutes) coronary artery disease prevention counseling today. She is 48 y.o. female and has risk factors for heart disease including obesity, insulin resistance and hyperlipidemia. We discussed intensive lifestyle modifications today with an emphasis on specific weight loss instructions and strategies. Pt was also informed of the importance of increasing exercise and decreasing saturated fats to help prevent heart disease.  Vitamin D Deficiency Grace Horton was informed that low vitamin D levels contributes to fatigue and are associated with obesity, breast, and colon cancer. Grace Horton agrees to continue to take prescription Vit D @50 ,000 IU every 2 weeks #2 with no refills and she will follow up for routine testing of vitamin D, at least 2-3 times per year. She was informed of the risk of over-replacement of vitamin D and agrees to not increase her dose unless she discusses this with [name] first. Grace Horton agrees to follow up as directed.  Depression with Emotional Eating Behaviors We discussed behavior modification techniques today to help Grace Horton deal with her emotional eating and depression. She has agreed to take Wellbutrin SR 150 mg daily #30 with no refills and follow up as directed.  Obesity Grace Horton is currently in the action stage of change. As such, her goal is to continue with weight loss efforts She has agreed to follow the Paleo eating plan Grace Horton has been instructed to work up to a goal of 150 minutes of combined cardio and strengthening exercise per week for weight loss and overall health benefits. We discussed the following Behavioral Modification Strategies today: increasing lean protein intake and work on meal planning and easy cooking  plans   Grace Horton has agreed to follow up with our clinic in 4 weeks. She was informed of the importance of frequent follow up visits to maximize her success with intensive lifestyle modifications for her multiple health conditions.  ALLERGIES: No Known Allergies  MEDICATIONS: Current Outpatient Medications on File Prior to Visit  Medication Sig Dispense Refill  . Diclofenac Sodium 2 % SOLN Place 2 g onto the skin 2 (two) times daily. 112 g 3  . fluticasone (FLONASE) 50 MCG/ACT nasal spray 2 sprays in each nostril daily 16 g 0  . Insulin Pen Needle (BD PEN NEEDLE NANO 2ND GEN) 32G X 4 MM MISC 1 Package by Does not apply route 2 (two) times daily. 100 each 0  . levonorgestrel-ethinyl estradiol (AVIANE,ALESSE,LESSINA) 0.1-20 MG-MCG tablet Take 1 tablet by mouth daily.    . Liraglutide -Weight Management (SAXENDA) 18 MG/3ML SOPN Inject 3 mg into the skin daily. 5 pen 0   No current facility-administered medications on file prior to visit.     PAST MEDICAL HISTORY: Past Medical History:  Diagnosis Date  . AC (acromioclavicular) joint bone spurs   . Anxiety   . Back pain   . Chondromalacia   . Depression   . GERD (gastroesophageal reflux disease)   . History of shingles 2011   Around Left Eye  . Joint pain   . Raynaud  disease   . Swelling    in feet or legs  . Vitamin D deficiency     PAST SURGICAL HISTORY: Past Surgical History:  Procedure Laterality Date  . CESAREAN SECTION    . SHOULDER SURGERY Left 2007  . TONSILLECTOMY      SOCIAL HISTORY: Social History   Tobacco Use  . Smoking status: Never Smoker  . Smokeless tobacco: Never Used  Substance Use Topics  . Alcohol use: No  . Drug use: No    FAMILY HISTORY: Family History  Problem Relation Age of Onset  . Heart disease Father   . Diabetes Father   . Kidney disease Father        Stage III   . Transient ischemic attack Father        S/P Carotid endarterectomy  . Hypertension Father   . Hyperlipidemia Father    . Irritable bowel syndrome Mother   . Hypertension Mother   . Hyperlipidemia Mother   . Obesity Mother     ROS: Review of Systems  Constitutional: Positive for weight loss.  Cardiovascular: Negative for chest pain.  Gastrointestinal: Negative for nausea and vomiting.  Musculoskeletal:       Negative for muscle weakness  Endo/Heme/Allergies:       Negative for hypoglycemia  Psychiatric/Behavioral: Positive for depression. Negative for suicidal ideas.    PHYSICAL EXAM: Blood pressure 126/74, pulse 74, temperature 98.3 F (36.8 C), temperature source Oral, height 5\' 2"  (1.575 m), weight 192 lb (87.1 kg), SpO2 99 %. Body mass index is 35.12 kg/m. Physical Exam Vitals signs reviewed.  Constitutional:      Appearance: Normal appearance. She is well-developed. She is obese.  Cardiovascular:     Rate and Rhythm: Normal rate.  Pulmonary:     Effort: Pulmonary effort is normal.  Musculoskeletal: Normal range of motion.  Skin:    General: Skin is warm and dry.  Neurological:     Mental Status: She is alert and oriented to person, place, and time.  Psychiatric:        Mood and Affect: Mood normal.        Behavior: Behavior normal.        Thought Content: Thought content does not include homicidal or suicidal ideation.     RECENT LABS AND TESTS: BMET    Component Value Date/Time   NA 139 09/28/2018 0940   K 4.7 09/28/2018 0940   CL 101 09/28/2018 0940   CO2 23 09/28/2018 0940   GLUCOSE 80 09/28/2018 0940   GLUCOSE 83 05/27/2012 1119   BUN 14 09/28/2018 0940   CREATININE 0.85 09/28/2018 0940   CREATININE 0.81 05/27/2012 1119   CALCIUM 8.7 09/28/2018 0940   GFRNONAA 80 09/28/2018 0940   GFRAA 92 09/28/2018 0940   Lab Results  Component Value Date   HGBA1C 5.0 09/28/2018   HGBA1C 5.2 11/17/2017   HGBA1C 5.1 06/11/2017   HGBA1C 5.5 02/17/2017   HGBA1C 5.3 10/08/2016   Lab Results  Component Value Date   INSULIN 13.9 09/28/2018   INSULIN 8.8 11/17/2017    INSULIN 6.5 06/11/2017   INSULIN 12.3 02/17/2017   INSULIN 9.7 10/08/2016   CBC    Component Value Date/Time   WBC 6.0 11/17/2017 1132   WBC 6.7 05/27/2012 1119   RBC 4.12 11/17/2017 1132   RBC 4.46 05/27/2012 1119   HGB 12.8 11/17/2017 1132   HCT 37.7 11/17/2017 1132   PLT 317 05/27/2012 1119   MCV 92 11/17/2017 1132  MCH 31.1 11/17/2017 1132   MCH 30.7 05/27/2012 1119   MCHC 34.0 11/17/2017 1132   MCHC 33.7 05/27/2012 1119   RDW 12.2 (L) 11/17/2017 1132   LYMPHSABS 1.9 11/17/2017 1132   MONOABS 0.4 05/27/2012 1119   EOSABS 0.1 11/17/2017 1132   BASOSABS 0.0 11/17/2017 1132   Iron/TIBC/Ferritin/ %Sat No results found for: IRON, TIBC, FERRITIN, IRONPCTSAT Lipid Panel     Component Value Date/Time   CHOL 173 09/28/2018 0940   TRIG 55 09/28/2018 0940   HDL 44 09/28/2018 0940   LDLCALC 118 (H) 09/28/2018 0940   Hepatic Function Panel     Component Value Date/Time   PROT 6.2 09/28/2018 0940   ALBUMIN 4.4 09/28/2018 0940   AST 16 09/28/2018 0940   ALT 15 09/28/2018 0940   ALKPHOS 62 09/28/2018 0940   BILITOT 0.3 09/28/2018 0940      Component Value Date/Time   TSH 2.330 09/28/2018 0940   TSH 1.500 10/08/2016 1025   TSH 1.630 06/25/2016 1053     Ref. Range 09/28/2018 09:40  Vitamin D, 25-Hydroxy Latest Ref Range: 30.0 - 100.0 ng/mL 91.0    OBESITY BEHAVIORAL INTERVENTION VISIT  Today's visit was # 40  Starting weight: 219 lbs Starting date: 06/25/2016 Today's weight : 192 lbs Today's date: 09/28/2018 Total lbs lost to date: 27    09/28/2018  Height 5\' 2"  (1.575 m)  Weight 192 lb (87.1 kg)  BMI (Calculated) 35.11  BLOOD PRESSURE - SYSTOLIC 248  BLOOD PRESSURE - DIASTOLIC 74   Body Fat % 42 %  Total Body Water (lbs) 74.8 lbs    ASK: We discussed the diagnosis of obesity with Grace Horton today and Grace Horton agreed to give [name] permission to discuss obesity behavioral modification therapy today.  ASSESS: Grace Horton has the diagnosis of obesity and her BMI  today is 35.11 Grace Horton is in the action stage of change   ADVISE: Grace Horton was educated on the multiple health risks of obesity as well as the benefit of weight loss to improve her health. She was advised of the need for long term treatment and the importance of lifestyle modifications to improve her current health and to decrease her risk of future health problems.  AGREE: Multiple dietary modification options and treatment options were discussed and  Grace Horton agreed to follow the recommendations documented in the above note.  ARRANGE: Esparanza was educated on the importance of frequent visits to treat obesity as outlined per CMS and USPSTF guidelines and agreed to schedule her next follow up appointment today.  Corey Skains, am acting as transcriptionist for [employer], DM I have reviewed the above documentation for accuracy and completeness, and I agree with the above. -Dennard Nip, MD

## 2018-10-03 ENCOUNTER — Other Ambulatory Visit (INDEPENDENT_AMBULATORY_CARE_PROVIDER_SITE_OTHER): Payer: Self-pay | Admitting: Family Medicine

## 2018-10-03 DIAGNOSIS — E559 Vitamin D deficiency, unspecified: Secondary | ICD-10-CM

## 2018-10-25 ENCOUNTER — Telehealth (INDEPENDENT_AMBULATORY_CARE_PROVIDER_SITE_OTHER): Payer: BC Managed Care – PPO | Admitting: Family Medicine

## 2018-10-25 ENCOUNTER — Other Ambulatory Visit: Payer: Self-pay

## 2018-10-25 ENCOUNTER — Encounter (INDEPENDENT_AMBULATORY_CARE_PROVIDER_SITE_OTHER): Payer: Self-pay | Admitting: Family Medicine

## 2018-10-25 DIAGNOSIS — F3289 Other specified depressive episodes: Secondary | ICD-10-CM

## 2018-10-25 DIAGNOSIS — E559 Vitamin D deficiency, unspecified: Secondary | ICD-10-CM

## 2018-10-25 DIAGNOSIS — Z6835 Body mass index (BMI) 35.0-35.9, adult: Secondary | ICD-10-CM | POA: Diagnosis not present

## 2018-10-25 MED ORDER — VITAMIN D (ERGOCALCIFEROL) 1.25 MG (50000 UNIT) PO CAPS: ORAL_CAPSULE | ORAL | 0 refills | Status: DC

## 2018-10-25 MED ORDER — BUPROPION HCL ER (SR) 150 MG PO TB12: 150.0000 mg | ORAL_TABLET | Freq: Every day | ORAL | 0 refills | Status: DC

## 2018-10-25 NOTE — Progress Notes (Signed)
Office: [phone]  /  Fax: [phone] TeleHealth Visit:  Grace Horton has verbally consented to this TeleHealth visit today. The patient is located at home, the provider is located at the [employer] [facility] office. The participants in this visit include the listed provider and patient. The visit was conducted today via face time.  HPI:   Chief Complaint: OBESITY Grace Horton is here to discuss her progress with her obesity treatment plan. She is on the lower carbohydrate, vegetable and lean protein rich diet plan and is following her eating plan approximately 75 % of the time. She states she is exercising with videos for 30 minutes 2 times per week. Grace Horton feels she has done well maintaining her weight, even on vacation. She is tolerating Saxenda well and her hunger is controlled.  We were unable to weigh the patient today for this TeleHealth visit. She feels as if she has maintained her weight since her last visit. She has lost 27 lbs since starting treatment with [name].  Vitamin D Deficiency Grace Horton has a diagnosis of vitamin D deficiency. Last Vit D level is at goal and she is at risk of over-replacement. She denies nausea, vomiting or muscle weakness.  Depression with Emotional Eating Behaviors Grace Horton's mood is stable on medications. She denies insomnia or palpitations. She is doing better avoiding comfort or emotional eating. Grace Horton struggles with emotional eating and using food for comfort to the extent that it is negatively impacting her health. She often snacks when she is not hungry. Grace Horton sometimes feels she is out of control and then feels guilty that she made poor food choices. She has been working on behavior modification techniques to help reduce her emotional eating and has been somewhat successful. She shows no sign of suicidal or homicidal ideations.  Depression screen [facility] 2/9 06/25/2016  Decreased Interest 3  Down, Depressed, Hopeless 1  PHQ - 2 Score 4  Altered  sleeping 0  Tired, decreased energy 3  Change in appetite 2  Feeling bad or failure about yourself  1  Trouble concentrating 1  Moving slowly or fidgety/restless 3  Suicidal thoughts 0  PHQ-9 Score 14    ASSESSMENT AND PLAN:  Other depression - with emotional eating  - Plan: buPROPion (WELLBUTRIN SR) 150 MG 12 hr tablet  Vitamin D deficiency - Plan: Vitamin D, Ergocalciferol, (DRISDOL) 1.25 MG (50000 UT) CAPS capsule  Class 2 severe obesity with serious comorbidity and body mass index (BMI) of 35.0 to 35.9 in adult, unspecified obesity type (HCC)  PLAN:  Vitamin D Deficiency Grace Horton was informed that low vitamin D levels contributes to fatigue and are associated with obesity, breast, and colon cancer. Grace Horton agrees to decrease prescription Vit D to 50,000 IU PO q monthly #1 with no refills. She will follow up for routine testing of vitamin D, at least 2-3 times per year. She was informed of the risk of over-replacement of vitamin D and agrees to not increase her dose unless she discusses this with [name] first. Grace Horton agrees to follow up with our clinic in 3 weeks.  Depression with Emotional Eating Behaviors We discussed behavior modification techniques today to help Grace Horton deal with her emotional eating and depression. Grace Horton agrees to continue taking Wellbutrin SR 150 mg q AM #30 and we will refill for 1 month. Grace Horton agrees to follow up with our clinic in 3 weeks.  Obesity Grace Horton is currently in the action stage of change. As such, her goal is to continue with weight loss  efforts She has agreed to follow a lower carbohydrate, vegetable and lean protein rich diet plan Grace Horton has been instructed to work up to a goal of 150 minutes of combined cardio and strengthening exercise per week for weight loss and overall health benefits. We discussed the following Behavioral Modification Strategies today: work on meal planning and easy cooking plans and travel eating strategies  We discussed  various medication options to help Grace Horton with her weight loss efforts and we both agreed to continue Saxenda at 1.2 mg and will follow up.   Grace Horton has agreed to follow up with our clinic in 3 weeks. She was informed of the importance of frequent follow up visits to maximize her success with intensive lifestyle modifications for her multiple health conditions.  ALLERGIES: No Known Allergies  MEDICATIONS: Current Outpatient Medications on File Prior to Visit  Medication Sig Dispense Refill  . Diclofenac Sodium 2 % SOLN Place 2 g onto the skin 2 (two) times daily. 112 g 3  . fluticasone (FLONASE) 50 MCG/ACT nasal spray 2 sprays in each nostril daily 16 g 0  . Insulin Pen Needle (BD PEN NEEDLE NANO 2ND GEN) 32G X 4 MM MISC 1 Package by Does not apply route 2 (two) times daily. 100 each 0  . levonorgestrel-ethinyl estradiol (AVIANE,ALESSE,LESSINA) 0.1-20 MG-MCG tablet Take 1 tablet by mouth daily.    . Liraglutide -Weight Management (SAXENDA) 18 MG/3ML SOPN Inject 3 mg into the skin daily. 5 pen 0   No current facility-administered medications on file prior to visit.     PAST MEDICAL HISTORY: Past Medical History:  Diagnosis Date  . AC (acromioclavicular) joint bone spurs   . Anxiety   . Back pain   . Chondromalacia   . Depression   . GERD (gastroesophageal reflux disease)   . History of shingles 2011   Around Left Eye  . Joint pain   . Raynaud disease   . Swelling    in feet or legs  . Vitamin D deficiency     PAST SURGICAL HISTORY: Past Surgical History:  Procedure Laterality Date  . CESAREAN SECTION    . SHOULDER SURGERY Left 2007  . TONSILLECTOMY      SOCIAL HISTORY: Social History   Tobacco Use  . Smoking status: Never Smoker  . Smokeless tobacco: Never Used  Substance Use Topics  . Alcohol use: No  . Drug use: No    FAMILY HISTORY: Family History  Problem Relation Age of Onset  . Heart disease Father   . Diabetes Father   . Kidney disease Father         Stage III   . Transient ischemic attack Father        S/P Carotid endarterectomy  . Hypertension Father   . Hyperlipidemia Father   . Irritable bowel syndrome Mother   . Hypertension Mother   . Hyperlipidemia Mother   . Obesity Mother     ROS: Review of Systems  Constitutional: Negative for weight loss.  Cardiovascular: Negative for palpitations.  Gastrointestinal: Negative for nausea and vomiting.  Musculoskeletal:       Negative muscle weakness  Psychiatric/Behavioral: Positive for depression. Negative for suicidal ideas. The patient does not have insomnia.     PHYSICAL EXAM: Pt in no acute distress  RECENT LABS AND TESTS: BMET    Component Value Date/Time   NA 139 09/28/2018 0940   K 4.7 09/28/2018 0940   CL 101 09/28/2018 0940   CO2 23 09/28/2018 0940  GLUCOSE 80 09/28/2018 0940   GLUCOSE 83 05/27/2012 1119   BUN 14 09/28/2018 0940   CREATININE 0.85 09/28/2018 0940   CREATININE 0.81 05/27/2012 1119   CALCIUM 8.7 09/28/2018 0940   GFRNONAA 80 09/28/2018 0940   GFRAA 92 09/28/2018 0940   Lab Results  Component Value Date   HGBA1C 5.0 09/28/2018   HGBA1C 5.2 11/17/2017   HGBA1C 5.1 06/11/2017   HGBA1C 5.5 02/17/2017   HGBA1C 5.3 10/08/2016   Lab Results  Component Value Date   INSULIN 13.9 09/28/2018   INSULIN 8.8 11/17/2017   INSULIN 6.5 06/11/2017   INSULIN 12.3 02/17/2017   INSULIN 9.7 10/08/2016   CBC    Component Value Date/Time   WBC 6.0 11/17/2017 1132   WBC 6.7 05/27/2012 1119   RBC 4.12 11/17/2017 1132   RBC 4.46 05/27/2012 1119   HGB 12.8 11/17/2017 1132   HCT 37.7 11/17/2017 1132   PLT 317 05/27/2012 1119   MCV 92 11/17/2017 1132   MCH 31.1 11/17/2017 1132   MCH 30.7 05/27/2012 1119   MCHC 34.0 11/17/2017 1132   MCHC 33.7 05/27/2012 1119   RDW 12.2 (L) 11/17/2017 1132   LYMPHSABS 1.9 11/17/2017 1132   MONOABS 0.4 05/27/2012 1119   EOSABS 0.1 11/17/2017 1132   BASOSABS 0.0 11/17/2017 1132   Iron/TIBC/Ferritin/ %Sat No results  found for: IRON, TIBC, FERRITIN, IRONPCTSAT Lipid Panel     Component Value Date/Time   CHOL 173 09/28/2018 0940   TRIG 55 09/28/2018 0940   HDL 44 09/28/2018 0940   LDLCALC 118 (H) 09/28/2018 0940   Hepatic Function Panel     Component Value Date/Time   PROT 6.2 09/28/2018 0940   ALBUMIN 4.4 09/28/2018 0940   AST 16 09/28/2018 0940   ALT 15 09/28/2018 0940   ALKPHOS 62 09/28/2018 0940   BILITOT 0.3 09/28/2018 0940      Component Value Date/Time   TSH 2.330 09/28/2018 0940   TSH 1.500 10/08/2016 1025   TSH 1.630 06/25/2016 1053      I, Trixie Dredge, am acting as Location manager for Dennard Nip, MD I have reviewed the above documentation for accuracy and completeness, and I agree with the above. -Dennard Nip, MD

## 2018-10-26 ENCOUNTER — Ambulatory Visit (INDEPENDENT_AMBULATORY_CARE_PROVIDER_SITE_OTHER): Payer: BC Managed Care – PPO | Admitting: Family Medicine

## 2018-11-18 ENCOUNTER — Ambulatory Visit (INDEPENDENT_AMBULATORY_CARE_PROVIDER_SITE_OTHER): Payer: BC Managed Care – PPO | Admitting: Physician Assistant

## 2018-11-18 ENCOUNTER — Encounter (INDEPENDENT_AMBULATORY_CARE_PROVIDER_SITE_OTHER): Payer: Self-pay | Admitting: Physician Assistant

## 2018-11-18 ENCOUNTER — Other Ambulatory Visit: Payer: Self-pay

## 2018-11-18 VITALS — BP 126/81 | HR 72 | Temp 98.1°F | Ht 62.0 in | Wt 191.0 lb

## 2018-11-18 DIAGNOSIS — F3289 Other specified depressive episodes: Secondary | ICD-10-CM | POA: Diagnosis not present

## 2018-11-18 DIAGNOSIS — Z9189 Other specified personal risk factors, not elsewhere classified: Secondary | ICD-10-CM | POA: Diagnosis not present

## 2018-11-18 DIAGNOSIS — T7840XA Allergy, unspecified, initial encounter: Secondary | ICD-10-CM

## 2018-11-18 DIAGNOSIS — Z6834 Body mass index (BMI) 34.0-34.9, adult: Secondary | ICD-10-CM

## 2018-11-18 DIAGNOSIS — E669 Obesity, unspecified: Secondary | ICD-10-CM | POA: Diagnosis not present

## 2018-11-18 MED ORDER — FLUTICASONE PROPIONATE 50 MCG/ACT NA SUSP: NASAL | 0 refills | Status: DC

## 2018-11-18 MED ORDER — SAXENDA 18 MG/3ML ~~LOC~~ SOPN: 3.0000 mg | PEN_INJECTOR | Freq: Every day | SUBCUTANEOUS | 0 refills | Status: DC

## 2018-11-18 MED ORDER — BUPROPION HCL ER (SR) 150 MG PO TB12: 150.0000 mg | ORAL_TABLET | Freq: Every day | ORAL | 0 refills | Status: DC

## 2018-11-22 NOTE — Progress Notes (Signed)
Office: [phone]  /  Fax: [phone]   HPI:   Chief Complaint: OBESITY Grace Horton is here to discuss her progress with her obesity treatment plan. She is on the lower carbohydrate, vegetable and lean protein rich diet plan and is following her eating plan approximately 90 % of the time. She states she is exercising 0 minutes 0 times per week. Grace Horton reports that she is enjoying the modified low carb plan, because she gets to eat fruit and a variety of vegetables. She is going to [facility] tomorrow for her daughter's birthday. Her weight is 191 lb (86.6 kg) today and has had a weight loss of 1 pound since her last in-office visit. She has lost 28 lbs since starting treatment with [name].  Allergies Grace Horton has allergies and she uses Flonase as needed. She is having intermittent symptoms.  Depression with emotional eating behaviors Grace Horton is struggling with emotional eating and using food for comfort to the extent that it is negatively impacting her health. She often snacks when she is not hungry. Grace Horton sometimes feels she is out of control and then feels guilty that she made poor food choices. She has been working on behavior modification techniques to help reduce her emotional eating and has been somewhat successful. Grace Horton is on Wellbutrin. Her blood pressure is normal. She shows no sign of suicidal or homicidal ideations.  At risk for cardiovascular disease Grace Horton is at a higher than average risk for cardiovascular disease due to obesity. She currently denies any chest pain.  ASSESSMENT AND PLAN:  Allergy, initial encounter - Plan: fluticasone (FLONASE) 50 MCG/ACT nasal spray  Other depression - with emotional eating  - Plan: buPROPion (WELLBUTRIN SR) 150 MG 12 hr tablet  At risk for heart disease  Class 1 obesity with serious comorbidity and body mass index (BMI) of 34.0 to 34.9 in adult, unspecified obesity type - Plan: Liraglutide -Weight Management (SAXENDA) 18 MG/3ML  SOPN  PLAN:  Allergies Grace Horton agrees to continue Flonase 2 sprays each nostril daily #1 with no refills and follow up as directed.  Depression with Emotional Eating Behaviors We discussed behavior modification techniques today to help Grace Horton deal with her emotional eating and depression. She has agreed to continue Wellbutrin SR 150 mg daily #30 with no refills and follow up as directed.  Cardiovascular risk counseling Grace Horton was given extended (15 minutes) coronary artery disease prevention counseling today. She is 48 y.o. female and has risk factors for heart disease including obesity. We discussed intensive lifestyle modifications today with an emphasis on specific weight loss instructions and strategies. Pt was also informed of the importance of increasing exercise and decreasing saturated fats to help prevent heart disease.  Obesity Grace Horton is currently in the action stage of change. As such, her goal is to continue with weight loss efforts She has agreed to follow the modified low carbohydrate, vegetable and lean protein rich diet plan Grace Horton has been instructed to work up to a goal of 150 minutes of combined cardio and strengthening exercise per week for weight loss and overall health benefits. We discussed the following Behavioral Modification Strategies today: keeping healthy foods in the home and work on meal planning and easy cooking plans  Grace Horton agrees to continue Saxenda 1.2 mg subQ daily #5 pens with no refills.  Grace Horton has agreed to follow up with our clinic in 3 to 4 weeks. She was informed of the importance of frequent follow up visits to maximize her success with intensive lifestyle modifications for her  multiple health conditions.  ALLERGIES: No Known Allergies  MEDICATIONS: Current Outpatient Medications on File Prior to Visit  Medication Sig Dispense Refill   Diclofenac Sodium 2 % SOLN Place 2 g onto the skin 2 (two) times daily. 112 g 3   Insulin Pen Needle (BD  PEN NEEDLE NANO 2ND GEN) 32G X 4 MM MISC 1 Package by Does not apply route 2 (two) times daily. 100 each 0   levonorgestrel-ethinyl estradiol (AVIANE,ALESSE,LESSINA) 0.1-20 MG-MCG tablet Take 1 tablet by mouth daily.     Vitamin D, Ergocalciferol, (DRISDOL) 1.25 MG (50000 UT) CAPS capsule TAKE 1 CAPSULE BY MOUTH Monthly 1 capsule 0   No current facility-administered medications on file prior to visit.     PAST MEDICAL HISTORY: Past Medical History:  Diagnosis Date   AC (acromioclavicular) joint bone spurs    Anxiety    Back pain    Chondromalacia    Depression    GERD (gastroesophageal reflux disease)    History of shingles 2011   Around Left Eye   Joint pain    Raynaud disease    Swelling    in feet or legs   Vitamin D deficiency     PAST SURGICAL HISTORY: Past Surgical History:  Procedure Laterality Date   CESAREAN SECTION     SHOULDER SURGERY Left 2007   TONSILLECTOMY      SOCIAL HISTORY: Social History   Tobacco Use   Smoking status: Never Smoker   Smokeless tobacco: Never Used  Substance Use Topics   Alcohol use: No   Drug use: No    FAMILY HISTORY: Family History  Problem Relation Age of Onset   Heart disease Father    Diabetes Father    Kidney disease Father        Stage III    Transient ischemic attack Father        S/P Carotid endarterectomy   Hypertension Father    Hyperlipidemia Father    Irritable bowel syndrome Mother    Hypertension Mother    Hyperlipidemia Mother    Obesity Mother     ROS: Review of Systems  Constitutional: Positive for weight loss.  Cardiovascular: Negative for chest pain.  Psychiatric/Behavioral: Positive for depression. Negative for suicidal ideas.    PHYSICAL EXAM: Blood pressure 126/81, pulse 72, temperature 98.1 F (36.7 C), temperature source Oral, height 5\' 2"  (1.575 m), weight 191 lb (86.6 kg), SpO2 98 %. Body mass index is 34.93 kg/m. Physical Exam Vitals signs reviewed.   Constitutional:      Appearance: Normal appearance. She is well-developed. She is obese.  Cardiovascular:     Rate and Rhythm: Normal rate.  Pulmonary:     Effort: Pulmonary effort is normal.  Musculoskeletal: Normal range of motion.  Skin:    General: Skin is warm and dry.  Neurological:     Mental Status: She is alert and oriented to person, place, and time.  Psychiatric:        Mood and Affect: Mood normal.        Behavior: Behavior normal.        Thought Content: Thought content does not include homicidal or suicidal ideation.     RECENT LABS AND TESTS: BMET    Component Value Date/Time   NA 139 09/28/2018 0940   K 4.7 09/28/2018 0940   CL 101 09/28/2018 0940   CO2 23 09/28/2018 0940   GLUCOSE 80 09/28/2018 0940   GLUCOSE 83 05/27/2012 1119   BUN 14 09/28/2018  0940   CREATININE 0.85 09/28/2018 0940   CREATININE 0.81 05/27/2012 1119   CALCIUM 8.7 09/28/2018 0940   GFRNONAA 80 09/28/2018 0940   GFRAA 92 09/28/2018 0940   Lab Results  Component Value Date   HGBA1C 5.0 09/28/2018   HGBA1C 5.2 11/17/2017   HGBA1C 5.1 06/11/2017   HGBA1C 5.5 02/17/2017   HGBA1C 5.3 10/08/2016   Lab Results  Component Value Date   INSULIN 13.9 09/28/2018   INSULIN 8.8 11/17/2017   INSULIN 6.5 06/11/2017   INSULIN 12.3 02/17/2017   INSULIN 9.7 10/08/2016   CBC    Component Value Date/Time   WBC 6.0 11/17/2017 1132   WBC 6.7 05/27/2012 1119   RBC 4.12 11/17/2017 1132   RBC 4.46 05/27/2012 1119   HGB 12.8 11/17/2017 1132   HCT 37.7 11/17/2017 1132   PLT 317 05/27/2012 1119   MCV 92 11/17/2017 1132   MCH 31.1 11/17/2017 1132   MCH 30.7 05/27/2012 1119   MCHC 34.0 11/17/2017 1132   MCHC 33.7 05/27/2012 1119   RDW 12.2 (L) 11/17/2017 1132   LYMPHSABS 1.9 11/17/2017 1132   MONOABS 0.4 05/27/2012 1119   EOSABS 0.1 11/17/2017 1132   BASOSABS 0.0 11/17/2017 1132   Iron/TIBC/Ferritin/ %Sat No results found for: IRON, TIBC, FERRITIN, IRONPCTSAT Lipid Panel     Component  Value Date/Time   CHOL 173 09/28/2018 0940   TRIG 55 09/28/2018 0940   HDL 44 09/28/2018 0940   LDLCALC 118 (H) 09/28/2018 0940   Hepatic Function Panel     Component Value Date/Time   PROT 6.2 09/28/2018 0940   ALBUMIN 4.4 09/28/2018 0940   AST 16 09/28/2018 0940   ALT 15 09/28/2018 0940   ALKPHOS 62 09/28/2018 0940   BILITOT 0.3 09/28/2018 0940      Component Value Date/Time   TSH 2.330 09/28/2018 0940   TSH 1.500 10/08/2016 1025   TSH 1.630 06/25/2016 1053     Ref. Range 09/28/2018 09:40  Vitamin D, 25-Hydroxy Latest Ref Range: 30.0 - 100.0 ng/mL 91.0    OBESITY BEHAVIORAL INTERVENTION VISIT  Today's visit was # 46  Starting weight: 219 lbs Starting date: 06/25/2016 Today's weight : 191 lbs Today's date: 11/18/2018 Total lbs lost to date: 28    11/18/2018  Height 5\' 2"  (1.575 m)  Weight 191 lb (86.6 kg)  BMI (Calculated) 34.93  BLOOD PRESSURE - SYSTOLIC [id number]  BLOOD PRESSURE - DIASTOLIC 81   Body Fat % [id number] %  Total Body Water (lbs) 75.8 lbs    ASK: We discussed the diagnosis of obesity with Grace Horton today and Grace Horton agreed to give [name] permission to discuss obesity behavioral modification therapy today.  ASSESS: Grace Horton has the diagnosis of obesity and her BMI today is 34.93 Grace Horton is in the action stage of change   ADVISE: Grace Horton was educated on the multiple health risks of obesity as well as the benefit of weight loss to improve her health. She was advised of the need for long term treatment and the importance of lifestyle modifications to improve her current health and to decrease her risk of future health problems.  AGREE: Multiple dietary modification options and treatment options were discussed and  Grace Horton agreed to follow the recommendations documented in the above note.  ARRANGE: Grace Horton was educated on the importance of frequent visits to treat obesity as outlined per CMS and USPSTF guidelines and agreed to schedule her next follow up appointment  today.  Grace Horton, am acting as transcriptionist  for Abby Potash, PA-C I, Abby Potash, PA-C have reviewed above note and agree with its content

## 2018-11-23 ENCOUNTER — Encounter (INDEPENDENT_AMBULATORY_CARE_PROVIDER_SITE_OTHER): Payer: Self-pay | Admitting: Family Medicine

## 2018-11-23 ENCOUNTER — Other Ambulatory Visit (INDEPENDENT_AMBULATORY_CARE_PROVIDER_SITE_OTHER): Payer: Self-pay | Admitting: Family Medicine

## 2018-11-23 DIAGNOSIS — Z6837 Body mass index (BMI) 37.0-37.9, adult: Secondary | ICD-10-CM

## 2018-11-30 ENCOUNTER — Other Ambulatory Visit (INDEPENDENT_AMBULATORY_CARE_PROVIDER_SITE_OTHER): Payer: Self-pay | Admitting: Family Medicine

## 2018-11-30 DIAGNOSIS — E559 Vitamin D deficiency, unspecified: Secondary | ICD-10-CM

## 2018-12-13 ENCOUNTER — Telehealth (INDEPENDENT_AMBULATORY_CARE_PROVIDER_SITE_OTHER): Payer: BC Managed Care – PPO | Admitting: Family Medicine

## 2018-12-13 ENCOUNTER — Encounter (INDEPENDENT_AMBULATORY_CARE_PROVIDER_SITE_OTHER): Payer: Self-pay | Admitting: Family Medicine

## 2018-12-13 ENCOUNTER — Other Ambulatory Visit: Payer: Self-pay

## 2018-12-15 ENCOUNTER — Other Ambulatory Visit (INDEPENDENT_AMBULATORY_CARE_PROVIDER_SITE_OTHER): Payer: Self-pay | Admitting: Physician Assistant

## 2018-12-15 DIAGNOSIS — T7840XA Allergy, unspecified, initial encounter: Secondary | ICD-10-CM

## 2018-12-22 ENCOUNTER — Ambulatory Visit (INDEPENDENT_AMBULATORY_CARE_PROVIDER_SITE_OTHER): Payer: BC Managed Care – PPO | Admitting: Family Medicine

## 2018-12-22 ENCOUNTER — Encounter: Payer: Self-pay | Admitting: Family Medicine

## 2018-12-22 ENCOUNTER — Other Ambulatory Visit: Payer: Self-pay

## 2018-12-22 VITALS — BP 124/84 | HR 80 | Temp 98.3°F | Ht 62.0 in | Wt 190.0 lb

## 2018-12-22 DIAGNOSIS — Z6834 Body mass index (BMI) 34.0-34.9, adult: Secondary | ICD-10-CM

## 2018-12-22 DIAGNOSIS — R5383 Other fatigue: Secondary | ICD-10-CM | POA: Diagnosis not present

## 2018-12-22 DIAGNOSIS — E8881 Metabolic syndrome: Secondary | ICD-10-CM

## 2018-12-22 DIAGNOSIS — E7849 Other hyperlipidemia: Secondary | ICD-10-CM

## 2018-12-22 DIAGNOSIS — Z9189 Other specified personal risk factors, not elsewhere classified: Secondary | ICD-10-CM | POA: Diagnosis not present

## 2018-12-22 DIAGNOSIS — E669 Obesity, unspecified: Secondary | ICD-10-CM

## 2018-12-22 DIAGNOSIS — F3289 Other specified depressive episodes: Secondary | ICD-10-CM

## 2018-12-22 DIAGNOSIS — R0602 Shortness of breath: Secondary | ICD-10-CM

## 2018-12-22 DIAGNOSIS — E559 Vitamin D deficiency, unspecified: Secondary | ICD-10-CM

## 2018-12-22 DIAGNOSIS — E88819 Insulin resistance, unspecified: Secondary | ICD-10-CM

## 2018-12-22 MED ORDER — VITAMIN D (ERGOCALCIFEROL) 1.25 MG (50000 UNIT) PO CAPS: ORAL_CAPSULE | ORAL | 0 refills | Status: DC

## 2018-12-22 MED ORDER — SAXENDA 18 MG/3ML ~~LOC~~ SOPN: 3.0000 mg | PEN_INJECTOR | Freq: Every day | SUBCUTANEOUS | 0 refills | Status: DC

## 2018-12-22 MED ORDER — BUPROPION HCL ER (SR) 150 MG PO TB12: 150.0000 mg | ORAL_TABLET | Freq: Every day | ORAL | 0 refills | Status: DC

## 2018-12-22 MED ORDER — BUPROPION HCL 75 MG PO TABS: 75.0000 mg | ORAL_TABLET | Freq: Every day | ORAL | 0 refills | Status: DC

## 2018-12-22 NOTE — Progress Notes (Signed)
Office: [phone]  /  Fax: [phone]   HPI:   Chief Complaint: OBESITY Grace Horton is here to discuss her progress with her obesity treatment plan. She is following a lower carbohydrate, vegetable and lean protein rich diet plan and is following her eating plan approximately 75% of the time. She states she is walking and exercising with exercise videos 30-60 minutes 2 times per week.  Grace Horton is happy with slow weight loss. She states she is going back to journaling with a goal of 100 calories and 70 grams of protein per day.  Her weight is 190 lb (86.2 kg) today and has had a weight loss of 1 pounds over a period of 5 weeks since her last visit. She has lost 29 lbs since starting treatment with [name].  Vitamin D deficiency Grace Horton has a diagnosis of Vitamin D deficiency, which is at goal. She is currently taking prescription Vit D and denies nausea, vomiting or muscle weakness.  At risk for osteopenia and osteoporosis Grace Horton is at higher risk of osteopenia and osteoporosis due to Vitamin D deficiency.   Depression with emotional eating behaviors Grace Horton is struggling with emotional eating and using food for comfort to the extent that it is negatively impacting her health. She often snacks when she is not hungry. Grace Horton sometimes feels she is out of control and then feels guilty that she made poor food choices. She has been working on behavior modification techniques to help reduce her emotional eating and has been somewhat successful. She shows no sign of suicidal or homicidal ideations.  Depression screen [facility] 2/9 06/25/2016  Decreased Interest 3  Down, Depressed, Hopeless 1  PHQ - 2 Score 4  Altered sleeping 0  Tired, decreased energy 3  Change in appetite 2  Feeling bad or failure about yourself  1  Trouble concentrating 1  Moving slowly or fidgety/restless 3  Suicidal thoughts 0  PHQ-9 Score 14   Insulin Resistance Grace Horton has a diagnosis of insulin resistance based on her  elevated fasting insulin level >5. Although Grace Horton's blood glucose readings are still under good control, insulin resistance puts her at greater risk of metabolic syndrome and diabetes. She is not taking metformin currently and continues to work on diet and exercise to decrease risk of diabetes.  Hyperlipidemia Grace Horton has hyperlipidemia and has been trying to improve her cholesterol levels with intensive lifestyle modification including a low saturated fat diet, exercise and weight loss. LDL 118 and HDL 44 on 09/28/2018. She denies any chest pain, claudication or myalgias.  Shortness of Breath with Fatigue Grace Horton notes increasing shortness of breath with exercising and seems to be worsening over time with weight gain. She notes getting out of breath sooner with activity than she used to. Grace Horton denies shortness of breath at rest or orthopnea.  ASSESSMENT AND PLAN:  Other fatigue  SOB (shortness of breath) on exertion  Vitamin D deficiency - Plan: Vitamin D, Ergocalciferol, (DRISDOL) 1.25 MG (50000 UT) CAPS capsule  Insulin resistance  Other hyperlipidemia  Other depression - with emotional eating  - Plan: buPROPion (WELLBUTRIN) 75 MG tablet, buPROPion (WELLBUTRIN SR) 150 MG 12 hr tablet  At risk for osteoporosis  Class 1 obesity with serious comorbidity and body mass index (BMI) of 34.0 to 34.9 in adult, unspecified obesity type - Plan: Liraglutide -Weight Management (SAXENDA) 18 MG/3ML SOPN  PLAN:  Vitamin D Deficiency Grace Horton was informed that low Vitamin D levels contributes to fatigue and are associated with obesity, breast, and colon cancer.  She agrees to continue to take prescription Vit D @ 50,000 IU, refill x1 month, and will follow-up for routine testing of Vitamin D, at least 2-3 times per year. She was informed of the risk of over-replacement of Vitamin D and agrees to not increase her dose unless she discusses this with [name] first. Grace Horton agrees to follow-up with our clinic in  2-3 weeks.  At risk for osteopenia and osteoporosis Grace Horton was given extended  (15 minutes) osteoporosis prevention counseling today. Grace Horton is at risk for osteopenia and osteoporosis due to her Vitamin D deficiency. She was encouraged to take her Vitamin D and follow her higher calcium diet and increase strengthening exercise to help strengthen her bones and decrease her risk of osteopenia and osteoporosis.  Depression with Emotional Eating Behaviors We discussed behavior modification techniques today to help Grace Horton deal with her emotional eating and depression. Grace Horton will increase her Wellbutrin to 225 mg daily, given refill for 4 weeks. She agrees to follow-up with our clinic in 2-3 weeks.   Insulin Resistance Grace Horton will continue to work on weight loss, exercise, and decreasing simple carbohydrates in her diet to help decrease the risk of diabetes. We dicussed metformin including benefits and risks. She was informed that eating too many simple carbohydrates or too many calories at one sitting increases the likelihood of GI side effects. Grace Horton was given a refill on Saxenda x4 weeks and agrees to follow-up with our clinic in 2-3 weeks.  Hyperlipidemia Grace Horton was informed of the American Heart Association Guidelines emphasizing intensive lifestyle modifications as the first line treatment for hyperlipidemia. We discussed many lifestyle modifications today in depth, and Grace Horton will continue to work on decreasing saturated fats such as fatty red meat, butter and many fried foods. Bridgitte will have labs rechecked in 3 months with the addition of a B12. She will also increase vegetables and lean protein in her diet and continue to work on exercise and weight loss efforts.  Shortness of Breath with Fatigue Grace Horton's shortness of breath appears to be obesity related and exercise induced. The indirect calorimeter results showed VO2 of 280 and a REE of 1946. She has agreed to work on weight loss and  gradually increase exercise to treat her exercise induced shortness of breath. If Grace Horton follows our instructions and loses weight without improvement of her shortness of breath, we will plan to refer to pulmonology. Grace Horton agrees to this plan.  Obesity Grace Horton is currently in the action stage of change. As such, her goal is to continue with weight loss efforts. She has agreed to keep a food journal with 1100-1500 calories and 70-90 grams of protein based on new Indirect Calorimetry results.   Grace Horton has been instructed to work up to a goal of 150 minutes of combined cardio and strengthening exercise per week for weight loss and overall health benefits. We discussed the following Behavioral Modification Strategies today: increasing lean protein intake, decreasing simple carbohydrates, increasing vegetables, increase H20 intake, emotional eating strategies, and holiday eating strategies.  Grace Horton has agreed to follow-up with our clinic in 2-3 weeks. She was informed of the importance of frequent follow-up visits to maximize her success with intensive lifestyle modifications for her multiple health conditions.  ALLERGIES: Allergies  Allergen Reactions  . Latex Other (See Comments)  . Levonorgestrel-Ethinyl Estrad Other (See Comments)    MEDICATIONS: Current Outpatient Medications on File Prior to Visit  Medication Sig Dispense Refill  . BD PEN NEEDLE NANO U/F 32G X 4 MM MISC USE  NEEDLE TWICE DAILY 100 each 0  . Diclofenac Sodium 2 % SOLN Place 2 g onto the skin 2 (two) times daily. 112 g 3  . fluticasone (FLONASE) 50 MCG/ACT nasal spray 2 sprays in each nostril daily 16 g 0  . levonorgestrel-ethinyl estradiol (AVIANE,ALESSE,LESSINA) 0.1-20 MG-MCG tablet Take 1 tablet by mouth daily.     No current facility-administered medications on file prior to visit.     PAST MEDICAL HISTORY: Past Medical History:  Diagnosis Date  . AC (acromioclavicular) joint bone spurs   . Anxiety   . Back pain    . Chondromalacia   . Depression   . GERD (gastroesophageal reflux disease)   . History of shingles 2011   Around Left Eye  . Joint pain   . Raynaud disease   . Swelling    in feet or legs  . Vitamin D deficiency     PAST SURGICAL HISTORY: Past Surgical History:  Procedure Laterality Date  . CESAREAN SECTION    . SHOULDER SURGERY Left 2007  . TONSILLECTOMY      SOCIAL HISTORY: Social History   Tobacco Use  . Smoking status: Never Smoker  . Smokeless tobacco: Never Used  Substance Use Topics  . Alcohol use: No  . Drug use: No    FAMILY HISTORY: Family History  Problem Relation Age of Onset  . Heart disease Father   . Diabetes Father   . Kidney disease Father        Stage III   . Transient ischemic attack Father        S/P Carotid endarterectomy  . Hypertension Father   . Hyperlipidemia Father   . Irritable bowel syndrome Mother   . Hypertension Mother   . Hyperlipidemia Mother   . Obesity Mother    ROS: Review of Systems  Constitutional: Positive for malaise/fatigue.  Respiratory: Positive for shortness of breath.   Cardiovascular: Negative for chest pain, orthopnea and claudication.  Gastrointestinal: Negative for nausea and vomiting.  Musculoskeletal: Negative for myalgias.       Negative for muscle weakness.  Psychiatric/Behavioral: Positive for depression (emotional eating). Negative for suicidal ideas.       Negative for homicidal ideas.   PHYSICAL EXAM: Blood pressure 124/84, pulse 80, temperature 98.3 F (36.8 C), temperature source Oral, height 5\' 2"  (1.575 m), weight 190 lb (86.2 kg), SpO2 95 %. Body mass index is 34.75 kg/m. Physical Exam Vitals signs reviewed.  Constitutional:      Appearance: Normal appearance. She is obese.  Cardiovascular:     Rate and Rhythm: Normal rate.     Pulses: Normal pulses.  Pulmonary:     Effort: Pulmonary effort is normal.     Breath sounds: Normal breath sounds.  Musculoskeletal: Normal range of  motion.  Skin:    General: Skin is warm and dry.  Neurological:     Mental Status: She is alert and oriented to person, place, and time.  Psychiatric:        Behavior: Behavior normal.   RECENT LABS AND TESTS: BMET    Component Value Date/Time   NA 139 09/28/2018 0940   K 4.7 09/28/2018 0940   CL 101 09/28/2018 0940   CO2 23 09/28/2018 0940   GLUCOSE 80 09/28/2018 0940   GLUCOSE 83 05/27/2012 1119   BUN 14 09/28/2018 0940   CREATININE 0.85 09/28/2018 0940   CREATININE 0.81 05/27/2012 1119   CALCIUM 8.7 09/28/2018 0940   GFRNONAA 80 09/28/2018 0940   GFRAA  92 09/28/2018 0940   Lab Results  Component Value Date   HGBA1C 5.0 09/28/2018   HGBA1C 5.2 11/17/2017   HGBA1C 5.1 06/11/2017   HGBA1C 5.5 02/17/2017   HGBA1C 5.3 10/08/2016   Lab Results  Component Value Date   INSULIN 13.9 09/28/2018   INSULIN 8.8 11/17/2017   INSULIN 6.5 06/11/2017   INSULIN 12.3 02/17/2017   INSULIN 9.7 10/08/2016   CBC    Component Value Date/Time   WBC 6.0 11/17/2017 1132   WBC 6.7 05/27/2012 1119   RBC 4.12 11/17/2017 1132   RBC 4.46 05/27/2012 1119   HGB 12.8 11/17/2017 1132   HCT 37.7 11/17/2017 1132   PLT 317 05/27/2012 1119   MCV 92 11/17/2017 1132   MCH 31.1 11/17/2017 1132   MCH 30.7 05/27/2012 1119   MCHC 34.0 11/17/2017 1132   MCHC 33.7 05/27/2012 1119   RDW 12.2 (L) 11/17/2017 1132   LYMPHSABS 1.9 11/17/2017 1132   MONOABS 0.4 05/27/2012 1119   EOSABS 0.1 11/17/2017 1132   BASOSABS 0.0 11/17/2017 1132   Iron/TIBC/Ferritin/ %Sat No results found for: IRON, TIBC, FERRITIN, IRONPCTSAT Lipid Panel     Component Value Date/Time   CHOL 173 09/28/2018 0940   TRIG 55 09/28/2018 0940   HDL 44 09/28/2018 0940   LDLCALC 118 (H) 09/28/2018 0940   Hepatic Function Panel     Component Value Date/Time   PROT 6.2 09/28/2018 0940   ALBUMIN 4.4 09/28/2018 0940   AST 16 09/28/2018 0940   ALT 15 09/28/2018 0940   ALKPHOS 62 09/28/2018 0940   BILITOT 0.3 09/28/2018 0940       Component Value Date/Time   TSH 2.330 09/28/2018 0940   TSH 1.500 10/08/2016 1025   TSH 1.630 06/25/2016 1053   Results for JANISE, GREENLY (MRN [id number]) as of 12/22/2018 16:33  Ref. Range 09/28/2018 09:40  Vitamin D, 25-Hydroxy Latest Ref Range: 30.0 - 100.0 ng/mL 91.0   OBESITY BEHAVIORAL INTERVENTION VISIT  Today's visit was #43  Starting weight: 219 lbs Starting date: 06/25/2016 Today's weight: 190 lbs  Today's date: 12/22/2018 Total lbs lost to date: 29     12/22/2018  Height 5\' 2"  (1.575 m)  Weight 190 lb (86.2 kg)  BMI (Calculated) 34.74  BLOOD PRESSURE - SYSTOLIC [id number]  BLOOD PRESSURE - DIASTOLIC 84   Body Fat % [id number] %  Total Body Water (lbs) 75.2 lbs  RMR 1946   ASK: We discussed the diagnosis of obesity with Alben Spittle today and Sherley agreed to give [name] permission to discuss obesity behavioral modification therapy today.  ASSESS: Lesieli has the diagnosis of obesity and her BMI today is 34.9. Kyri is in the action stage of change.   ADVISE: Jamiee was educated on the multiple health risks of obesity as well as the benefit of weight loss to improve her health. She was advised of the need for long term treatment and the importance of lifestyle modifications to improve her current health and to decrease her risk of future health problems.  AGREE: Multiple dietary modification options and treatment options were discussed and  Kerigan agreed to follow the recommendations documented in the above note.  ARRANGE: Grace Horton was educated on the importance of frequent visits to treat obesity as outlined per CMS and USPSTF guidelines and agreed to schedule her next follow up appointment today.  IMichaelene Song, am acting as Location manager for [employer]. Juleen China, DO  I have reviewed the above documentation for accuracy and completeness, and I agree  with the above. Briscoe Deutscher, DO

## 2018-12-23 ENCOUNTER — Encounter (INDEPENDENT_AMBULATORY_CARE_PROVIDER_SITE_OTHER): Payer: Self-pay | Admitting: Family Medicine

## 2019-01-03 ENCOUNTER — Ambulatory Visit (INDEPENDENT_AMBULATORY_CARE_PROVIDER_SITE_OTHER): Payer: BC Managed Care – PPO | Admitting: Family Medicine

## 2019-01-10 ENCOUNTER — Encounter (INDEPENDENT_AMBULATORY_CARE_PROVIDER_SITE_OTHER): Payer: Self-pay | Admitting: Family Medicine

## 2019-01-10 NOTE — Telephone Encounter (Signed)
Please advise 

## 2019-01-18 ENCOUNTER — Ambulatory Visit (INDEPENDENT_AMBULATORY_CARE_PROVIDER_SITE_OTHER): Payer: BC Managed Care – PPO | Admitting: Family Medicine

## 2019-01-18 ENCOUNTER — Encounter (INDEPENDENT_AMBULATORY_CARE_PROVIDER_SITE_OTHER): Payer: Self-pay | Admitting: Family Medicine

## 2019-01-18 ENCOUNTER — Other Ambulatory Visit: Payer: Self-pay

## 2019-01-18 VITALS — BP 135/88 | HR 74 | Temp 98.1°F | Ht 62.0 in | Wt 192.0 lb

## 2019-01-18 DIAGNOSIS — E559 Vitamin D deficiency, unspecified: Secondary | ICD-10-CM | POA: Diagnosis not present

## 2019-01-18 DIAGNOSIS — F3289 Other specified depressive episodes: Secondary | ICD-10-CM | POA: Diagnosis not present

## 2019-01-18 DIAGNOSIS — E669 Obesity, unspecified: Secondary | ICD-10-CM

## 2019-01-18 DIAGNOSIS — Z6835 Body mass index (BMI) 35.0-35.9, adult: Secondary | ICD-10-CM

## 2019-01-18 DIAGNOSIS — Z9189 Other specified personal risk factors, not elsewhere classified: Secondary | ICD-10-CM

## 2019-01-18 MED ORDER — VITAMIN D (ERGOCALCIFEROL) 1.25 MG (50000 UNIT) PO CAPS: ORAL_CAPSULE | ORAL | 0 refills | Status: DC

## 2019-01-18 MED ORDER — BUPROPION HCL ER (SR) 200 MG PO TB12: 200.0000 mg | ORAL_TABLET | Freq: Every day | ORAL | 0 refills | Status: DC

## 2019-01-20 ENCOUNTER — Encounter (INDEPENDENT_AMBULATORY_CARE_PROVIDER_SITE_OTHER): Payer: Self-pay | Admitting: Family Medicine

## 2019-01-20 MED ORDER — BD PEN NEEDLE NANO U/F 32G X 4 MM MISC: 1.0000 | Freq: Every day | 0 refills | Status: DC

## 2019-01-20 MED ORDER — SAXENDA 18 MG/3ML ~~LOC~~ SOPN: 3.0000 mg | PEN_INJECTOR | Freq: Every day | SUBCUTANEOUS | 0 refills | Status: DC

## 2019-01-25 NOTE — Progress Notes (Signed)
Office: [phone]  /  Fax: [phone]   HPI:  Chief Complaint: OBESITY Grace Horton is here to discuss her progress with her obesity treatment plan. She is keeping a food journal with 1100-1500 calories calories and 70-90 grams of protein and states she is following her eating plan approximately 50% of the time. She states she is exercising 0 minutes 0 times per week.  Grace Horton has not been able to journal as strict. She did some celebration eating over Thanksgiving and has gained a little weight. She is tolerating Saxenda well and requests a refill.  Today's visit was #44  Starting weight: 219 lbs Starting date: 06/25/2016 Today's weight: 192 lbs  Today's date: 01/18/2019 Total lbs lost to date: 27  Total lbs lost since last in-office visit: Grace Horton [facility] has been taking 2 different doses of Wellbutrin and finds this is difficult and may be affecting her sleep. She would like to discuss options.  Vitamin D deficiency Grace Horton has a diagnosis of Vitamin D deficiency. Last Vitamin D 91.0 on 09/28/2018. She is stable on prescription Vitamin D and requests a refill. No nausea, vomiting, or muscle weakness.  At risk for cardiovascular disease Grace Horton is at a higher than average risk for cardiovascular disease due to obesity. She currently denies any chest pain.  ASSESSMENT AND PLAN:  Vitamin D deficiency - Plan: Vitamin D, Ergocalciferol, (DRISDOL) 1.25 MG (50000 UT) CAPS capsule  Other depression - with emotional eating  - Plan: buPROPion (WELLBUTRIN SR) 200 MG 12 hr tablet  At risk for heart disease  Class 2 severe obesity with serious comorbidity and body mass index (BMI) of 35.0 to 35.9 in adult, unspecified obesity type ([place]) - Plan: Liraglutide -Weight Management (SAXENDA) 18 MG/3ML SOPN, Insulin Pen Needle (BD PEN NEEDLE NANO U/F) 32G X 4 MM MISC  PLAN:  Emotional Eating Louis will discontinue Wellbutrin 75 and increase to Wellbutrin SR 200 mg QAM. She agrees to  follow-up with our clinic in 4 weeks.  Vitamin D Deficiency Grace Horton was informed that low Vitamin D levels contributes to fatigue and are associated with obesity, breast, and colon cancer. She agrees to continue to take prescription Vit D @ 50,000 monthly #1 and will follow-up for routine testing of Vitamin D at her next visit. She was informed of the risk of over-replacement of Vitamin D and agrees to not increase her dose unless she discusses this with [name] first. Grace Horton agrees to follow-up with our clinic in 4 weeks.  Cardiovascular risk counseling Grace Horton was given (~15 minutes) coronary artery disease prevention counseling today. She is 48 y.o. female and has risk factors for heart disease including obesity. We discussed intensive lifestyle modifications today with an emphasis on specific weight loss instructions and strategies.   Obesity Grace Horton is currently in the action stage of change. As such, her goal is to continue with weight loss efforts She has agreed to keep a food journal with 1100-1500 calories and 90+ grams of protein.  Grace Horton has been instructed to work up to a goal of 150 minutes of combined cardio and strengthening exercise per week for weight loss and overall health benefits. We discussed the following Behavioral Modification Strategies today: holiday eating strategies and keep a strict food journal.  Grace Horton was given a refill on her Saxenda x1 month and nano needles. She agrees to follow-up with our clinic in 4 weeks.  Grace Horton has agreed to follow-up with our clinic in 4 weeks. She was informed of the importance of  frequent follow-up visits to maximize her success with intensive lifestyle modifications for her multiple health conditions.  ALLERGIES: Allergies  Allergen Reactions  . Latex Other (See Comments)  . Levonorgestrel-Ethinyl Estrad Other (See Comments)    MEDICATIONS: Current Outpatient Medications on File Prior to Visit  Medication Sig Dispense Refill  .  Diclofenac Sodium 2 % SOLN Place 2 g onto the skin 2 (two) times daily. 112 g 3  . fluticasone (FLONASE) 50 MCG/ACT nasal spray 2 sprays in each nostril daily 16 g 0  . levonorgestrel-ethinyl estradiol (AVIANE,ALESSE,LESSINA) 0.1-20 MG-MCG tablet Take 1 tablet by mouth daily.     No current facility-administered medications on file prior to visit.    PAST MEDICAL HISTORY: Past Medical History:  Diagnosis Date  . AC (acromioclavicular) joint bone spurs   . Anxiety   . Back pain   . Chondromalacia   . Depression   . GERD (gastroesophageal reflux disease)   . History of shingles 2011   Around Left Eye  . Joint pain   . Raynaud disease   . Swelling    in feet or legs  . Vitamin D deficiency     PAST SURGICAL HISTORY: Past Surgical History:  Procedure Laterality Date  . CESAREAN SECTION    . SHOULDER SURGERY Left 2007  . TONSILLECTOMY      SOCIAL HISTORY: Social History   Tobacco Use  . Smoking status: Never Smoker  . Smokeless tobacco: Never Used  Substance Use Topics  . Alcohol use: No  . Drug use: No    FAMILY HISTORY: Family History  Problem Relation Age of Onset  . Heart disease Father   . Diabetes Father   . Kidney disease Father        Stage III   . Transient ischemic attack Father        S/P Carotid endarterectomy  . Hypertension Father   . Hyperlipidemia Father   . Irritable bowel syndrome Mother   . Hypertension Mother   . Hyperlipidemia Mother   . Obesity Mother    ROS: Review of Systems  Gastrointestinal: Negative for nausea and vomiting.  Musculoskeletal:       Negative for muscle weakness.   PHYSICAL EXAM: Blood pressure 135/88, pulse 74, temperature 98.1 F (36.7 C), temperature source Oral, height 5\' 2"  (1.575 m), weight 192 lb (87.1 kg), SpO2 99 %. Body mass index is 35.12 kg/m. Physical Exam Vitals reviewed.  Constitutional:      Appearance: Normal appearance. She is obese.  Cardiovascular:     Rate and Rhythm: Normal rate.      Pulses: Normal pulses.  Pulmonary:     Effort: Pulmonary effort is normal.     Breath sounds: Normal breath sounds.  Musculoskeletal:        General: Normal range of motion.  Skin:    General: Skin is warm and dry.  Neurological:     Mental Status: She is alert and oriented to person, place, and time.  Psychiatric:        Behavior: Behavior normal.   RECENT LABS AND TESTS: BMET    Component Value Date/Time   NA 139 09/28/2018 0940   K 4.7 09/28/2018 0940   CL 101 09/28/2018 0940   CO2 23 09/28/2018 0940   GLUCOSE 80 09/28/2018 0940   GLUCOSE 83 05/27/2012 1119   BUN 14 09/28/2018 0940   CREATININE 0.85 09/28/2018 0940   CREATININE 0.81 05/27/2012 1119   CALCIUM 8.7 09/28/2018 0940   GFRNONAA  80 09/28/2018 0940   GFRAA 92 09/28/2018 0940   Lab Results  Component Value Date   HGBA1C 5.0 09/28/2018   HGBA1C 5.2 11/17/2017   HGBA1C 5.1 06/11/2017   HGBA1C 5.5 02/17/2017   HGBA1C 5.3 10/08/2016   Lab Results  Component Value Date   INSULIN 13.9 09/28/2018   INSULIN 8.8 11/17/2017   INSULIN 6.5 06/11/2017   INSULIN 12.3 02/17/2017   INSULIN 9.7 10/08/2016   CBC    Component Value Date/Time   WBC 6.0 11/17/2017 1132   WBC 6.7 05/27/2012 1119   RBC 4.12 11/17/2017 1132   RBC 4.46 05/27/2012 1119   HGB 12.8 11/17/2017 1132   HCT 37.7 11/17/2017 1132   PLT 317 05/27/2012 1119   MCV 92 11/17/2017 1132   MCH 31.1 11/17/2017 1132   MCH 30.7 05/27/2012 1119   MCHC 34.0 11/17/2017 1132   MCHC 33.7 05/27/2012 1119   RDW 12.2 (L) 11/17/2017 1132   LYMPHSABS 1.9 11/17/2017 1132   MONOABS 0.4 05/27/2012 1119   EOSABS 0.1 11/17/2017 1132   BASOSABS 0.0 11/17/2017 1132   Iron/TIBC/Ferritin/ %Sat No results found for: IRON, TIBC, FERRITIN, IRONPCTSAT Lipid Panel     Component Value Date/Time   CHOL 173 09/28/2018 0940   TRIG 55 09/28/2018 0940   HDL 44 09/28/2018 0940   LDLCALC 118 (H) 09/28/2018 0940   Hepatic Function Panel     Component Value Date/Time    PROT 6.2 09/28/2018 0940   ALBUMIN 4.4 09/28/2018 0940   AST 16 09/28/2018 0940   ALT 15 09/28/2018 0940   ALKPHOS 62 09/28/2018 0940   BILITOT 0.3 09/28/2018 0940      Component Value Date/Time   TSH 2.330 09/28/2018 0940   TSH 1.500 10/08/2016 1025   TSH 1.630 06/25/2016 1053      I, Michaelene Song, am acting as Location manager for Dennard Nip, MD I have reviewed the above documentation for accuracy and completeness, and I agree with the above. -Dennard Nip, MD

## 2019-02-14 ENCOUNTER — Other Ambulatory Visit (INDEPENDENT_AMBULATORY_CARE_PROVIDER_SITE_OTHER): Payer: Self-pay | Admitting: Family Medicine

## 2019-02-14 DIAGNOSIS — E559 Vitamin D deficiency, unspecified: Secondary | ICD-10-CM

## 2019-02-16 ENCOUNTER — Other Ambulatory Visit (INDEPENDENT_AMBULATORY_CARE_PROVIDER_SITE_OTHER): Payer: Self-pay | Admitting: Family Medicine

## 2019-02-16 DIAGNOSIS — F3289 Other specified depressive episodes: Secondary | ICD-10-CM

## 2019-02-22 ENCOUNTER — Ambulatory Visit (INDEPENDENT_AMBULATORY_CARE_PROVIDER_SITE_OTHER): Payer: BC Managed Care – PPO | Admitting: Family Medicine

## 2019-02-22 ENCOUNTER — Encounter (INDEPENDENT_AMBULATORY_CARE_PROVIDER_SITE_OTHER): Payer: Self-pay | Admitting: Family Medicine

## 2019-02-22 ENCOUNTER — Other Ambulatory Visit: Payer: Self-pay

## 2019-02-22 VITALS — BP 118/80 | HR 76 | Temp 98.3°F | Ht 62.0 in | Wt 189.0 lb

## 2019-02-22 DIAGNOSIS — R5383 Other fatigue: Secondary | ICD-10-CM

## 2019-02-22 DIAGNOSIS — E7849 Other hyperlipidemia: Secondary | ICD-10-CM | POA: Diagnosis not present

## 2019-02-22 DIAGNOSIS — E669 Obesity, unspecified: Secondary | ICD-10-CM

## 2019-02-22 DIAGNOSIS — E559 Vitamin D deficiency, unspecified: Secondary | ICD-10-CM | POA: Diagnosis not present

## 2019-02-22 DIAGNOSIS — E8881 Metabolic syndrome: Secondary | ICD-10-CM | POA: Diagnosis not present

## 2019-02-22 DIAGNOSIS — E88819 Insulin resistance, unspecified: Secondary | ICD-10-CM

## 2019-02-22 DIAGNOSIS — Z6834 Body mass index (BMI) 34.0-34.9, adult: Secondary | ICD-10-CM

## 2019-02-22 DIAGNOSIS — F3289 Other specified depressive episodes: Secondary | ICD-10-CM

## 2019-02-22 DIAGNOSIS — Z9189 Other specified personal risk factors, not elsewhere classified: Secondary | ICD-10-CM

## 2019-02-22 MED ORDER — BUPROPION HCL ER (SR) 200 MG PO TB12: 200.0000 mg | ORAL_TABLET | Freq: Every day | ORAL | 0 refills | Status: DC

## 2019-02-22 MED ORDER — VITAMIN D (ERGOCALCIFEROL) 1.25 MG (50000 UNIT) PO CAPS: ORAL_CAPSULE | ORAL | 0 refills | Status: DC

## 2019-02-23 LAB — LIPID PANEL WITH LDL/HDL RATIO
Cholesterol, Total: 173 mg/dL (ref 100–199)
HDL: 43 mg/dL (ref >39)
LDL Chol Calc (NIH): 119 mg/dL — ABNORMAL HIGH (ref 0–99)
LDL/HDL Ratio: 2.8 ratio (ref 0.0–3.2)
Triglycerides: 58 mg/dL (ref 0–149)
VLDL Cholesterol Cal: 11 mg/dL (ref 5–40)

## 2019-02-23 LAB — COMPREHENSIVE METABOLIC PANEL
ALT: 10 IU/L (ref 0–32)
AST: 15 IU/L (ref 0–40)
Albumin/Globulin Ratio: 2.3 — ABNORMAL HIGH (ref 1.2–2.2)
Albumin: 4.3 g/dL (ref 3.8–4.8)
Alkaline Phosphatase: 67 IU/L (ref 39–117)
BUN/Creatinine Ratio: 20 (ref 9–23)
BUN: 15 mg/dL (ref 6–24)
Bilirubin Total: 0.3 mg/dL (ref 0.0–1.2)
CO2: 23 mmol/L (ref 20–29)
Calcium: 8.9 mg/dL (ref 8.7–10.2)
Chloride: 104 mmol/L (ref 96–106)
Creatinine, Ser: 0.76 mg/dL (ref 0.57–1.00)
GFR calc Af Amer: 106 mL/min/{1.73_m2} (ref >59)
GFR calc non Af Amer: 92 mL/min/{1.73_m2} (ref >59)
Globulin, Total: 1.9 g/dL (ref 1.5–4.5)
Glucose: 69 mg/dL (ref 65–99)
Potassium: 4.6 mmol/L (ref 3.5–5.2)
Sodium: 140 mmol/L (ref 134–144)
Total Protein: 6.2 g/dL (ref 6.0–8.5)

## 2019-02-23 LAB — INSULIN, RANDOM: INSULIN: 7.7 u[IU]/mL (ref 2.6–24.9)

## 2019-02-23 LAB — HEMOGLOBIN A1C
Est. average glucose Bld gHb Est-mCnc: 103 mg/dL
Hgb A1c MFr Bld: 5.2 % (ref 4.8–5.6)

## 2019-02-23 LAB — VITAMIN B12: Vitamin B-12: 355 pg/mL (ref 232–1245)

## 2019-02-23 LAB — VITAMIN D 25 HYDROXY (VIT D DEFICIENCY, FRACTURES): Vit D, 25-Hydroxy: 46.1 ng/mL (ref 30.0–100.0)

## 2019-02-23 NOTE — Progress Notes (Signed)
Chief Complaint:   OBESITY Grace Horton is here to discuss her progress with her obesity treatment plan along with follow-up of her obesity related diagnoses. Grace Horton is keeping a food journal and adhering to recommended goals of 1100 to 1500 calories and 90+ grams of protein and following a lower carbohydrate, vegetable and lean protein rich diet plan and states she is following her eating plan approximately 50% of the time. Grace Horton states she is walking, biking, and working out to videos for 20 to 30 minutes 6 times per week.  Today's visit was #: 78 Starting weight: 219 lbs Starting date: 06/25/16 Today's weight: 189 lbs Today's date: 02/22/2019 Total lbs lost to date: 30 Total lbs lost since last in-office visit: 3  Interim History: Grace Horton did some celebration eating over the holidays, but was still mindful of her food choices. She went back to the [facility] plan after January 1st and has done well with weight loss.  Subjective:   1. Vitamin D deficiency Grace Horton's Vitamin D level was 91.0 on 09/28/18 and was close to being over-replaced. She is stable on prescription vit D once per month. She denies nausea, vomiting or muscle weakness.  2. Other fatigue Grace Horton notes increased fatigue and requests a B12 level to be drawn. She does not have a history of B12 anemia, surgery, and is not a vegetarian.  3. Other hyperlipidemia Grace Horton has hyperlipidemia and has been trying to improve her cholesterol levels with intensive lifestyle modification including a low saturated fat diet, exercise, and weight loss. She denies any chest pain. Grace Horton is due for labs.  Lab Results  Component Value Date   ALT 10 02/22/2019   AST 15 02/22/2019   ALKPHOS 67 02/22/2019   BILITOT 0.3 02/22/2019   Lab Results  Component Value Date   CHOL 173 02/22/2019   HDL 43 02/22/2019   LDLCALC 119 (H) 02/22/2019   TRIG 58 02/22/2019   4. Insulin resistance Grace Horton has a diagnosis of insulin resistance based on  her elevated fasting insulin level >5. She is stable on diet and exercise to decrease her risk of diabetes. Grace Horton notes decreased polyphagia. She is due for labs.  Lab Results  Component Value Date   INSULIN 7.7 02/22/2019   INSULIN 13.9 09/28/2018   INSULIN 8.8 11/17/2017   INSULIN 6.5 06/11/2017   INSULIN 12.3 02/17/2017   5. Other depression, with emotional eating  Grace Horton is stable on Wellbutrin and thought that the increased dose had helped to decrease her emotional eating. She is struggling with emotional eating and using food for comfort to the extent that it is negatively impacting her health. She often snacks when she is not hungry. Grace Horton sometimes feels she is out of control and then feels guilty that she made poor food choices. She has been working on behavior modification techniques to help reduce her emotional eating and has been somewhat successful.   6. At risk for heart disease Grace Horton is at a higher than average risk for cardiovascular disease due to hyperlipidemia and obesity. Reviewed: no chest pain on exertion, no dyspnea on exertion, and no swelling of ankles.  Assessment/Plan:   1. Vitamin D deficiency Low Vitamin D level contributes to fatigue and are associated with obesity, breast, and colon cancer. She agrees to continue to take prescription Vitamin D @50 ,000 IU every week and will follow-up for routine testing of vitamin D, at least 2-3 times per year to avoid over-replacement.   - Vitamin D (25 hydroxy) -  Vitamin D, Ergocalciferol, (DRISDOL) 1.25 MG (50000 UNIT) CAPS capsule; TAKE 1 CAPSULE BY MOUTH Monthly  Dispense: 1 capsule; Refill: 0  2. Other fatigue Grace Horton was informed fatigue may be related to obesity, depression, or many other causes. Labs will be ordered, and in the meanwhile, Grace Horton has agreed to work on diet, exercise, and weight loss.  - B12  3. Other hyperlipidemia Cardiovascular risk and specific lipid/LDL goals reviewed.  We discussed several  lifestyle modifications today and Grace Horton will continue to work on diet, exercise and weight loss efforts. Orders and follow up as documented in patient record.   Counseling Intensive lifestyle modifications are the first line treatment for this issue. . Dietary changes: Increase soluble fiber. Decrease simple carbohydrates. . Exercise changes: Moderate to vigorous-intensity aerobic activity 150 minutes per week if tolerated. . Lipid-lowering medications: see documented in medical record.  - Comprehensive Metabolic Panel (CMET) - HgB A1c - Insulin, random - Lipid Panel With LDL/HDL Ratio  4. Insulin resistance Grace Horton will continue to work on weight loss, exercise, and decreasing simple carbohydrates to help decrease the risk of diabetes. Grace Horton agreed to follow-up with [name] as directed to closely monitor her progress.  - Comprehensive Metabolic Panel (CMET) - HgB A1c - Insulin, random  5. Other depression, with emotional eating  Behavior modification techniques were discussed today to help Grace Horton deal with her emotional/non-hunger eating behaviors. Orders and follow up as documented in patient record.   - buPROPion (WELLBUTRIN SR) 200 MG 12 hr tablet; Take 1 tablet (200 mg total) by mouth daily before breakfast.  Dispense: 30 tablet; Refill: 0  6. At risk for heart disease Grace Horton was given approximately 15 minutes of coronary artery disease prevention counseling today. She is 49 y.o. female and has risk factors for heart disease including hyperlipidemia and obesity. We discussed intensive lifestyle modifications today with an emphasis on specific weight loss instructions and strategies.   7. Class 1 obesity with serious comorbidity and body mass index (BMI) of 34.0 to 34.9 in adult, unspecified obesity type  Grace Horton is currently in the action stage of change. As such, her goal is to continue with weight loss efforts. She has agreed to on the [facility] plan.   : For substantial health  benefits, adults should do at least 150 minutes (2 hours and 30 minutes) a week of moderate-intensity, or 75 minutes (1 hour and 15 minutes) a week of vigorous-intensity aerobic physical activity, or an equivalent combination of moderate- and vigorous-intensity aerobic activity. Aerobic activity should be performed in episodes of at least 10 minutes, and preferably, it should be spread throughout the week. Adults should also include muscle-strengthening activities that involve all major muscle groups on 2 or more days a week.  We discussed the following behavioral modification strategies today: meal planning and cooking strategies.  Grace Horton has agreed to follow-up with our clinic in 3 to 4 weeks. She was informed of the importance of frequent follow-up visits to maximize her success with intensive lifestyle modifications for her multiple health conditions.   Grace Horton was informed we would discuss her lab results at her next visit unless there is a critical issue that needs to be addressed sooner. Grace Horton agreed to keep her next visit at the agreed upon time to discuss these results.  Objective:   Blood pressure 118/80, pulse 76, temperature 98.3 F (36.8 C), temperature source Oral, height 5\' 2"  (1.575 m), weight 189 lb (85.7 kg), SpO2 98 %. Body mass index is 34.57 kg/m.  General: Cooperative, alert, well developed, in no acute distress. HEENT: Conjunctivae and lids unremarkable. Neck: No thyromegaly.  Cardiovascular: Regular rhythm.  Lungs: Normal work of breathing. Extremities: No edema.  Neurologic: No focal deficits.   Lab Results  Component Value Date   CREATININE 0.76 02/22/2019   BUN 15 02/22/2019   NA 140 02/22/2019   K 4.6 02/22/2019   CL 104 02/22/2019   CO2 23 02/22/2019   Lab Results  Component Value Date   ALT 10 02/22/2019   AST 15 02/22/2019   ALKPHOS 67 02/22/2019   BILITOT 0.3 02/22/2019   Lab Results  Component Value Date   HGBA1C 5.2 02/22/2019    HGBA1C 5.0 09/28/2018   HGBA1C 5.2 11/17/2017   HGBA1C 5.1 06/11/2017   HGBA1C 5.5 02/17/2017   Lab Results  Component Value Date   INSULIN 7.7 02/22/2019   INSULIN 13.9 09/28/2018   INSULIN 8.8 11/17/2017   INSULIN 6.5 06/11/2017   INSULIN 12.3 02/17/2017   Lab Results  Component Value Date   TSH 2.330 09/28/2018   Lab Results  Component Value Date   CHOL 173 02/22/2019   HDL 43 02/22/2019   LDLCALC 119 (H) 02/22/2019   TRIG 58 02/22/2019   Lab Results  Component Value Date   WBC 6.0 11/17/2017   HGB 12.8 11/17/2017   HCT 37.7 11/17/2017   MCV 92 11/17/2017   PLT 317 05/27/2012   No results found for: IRON, TIBC, FERRITIN  Attestation Statements:   Reviewed by clinician on day of visit: allergies, medications, problem list, medical history, surgical history, family history, social history, and previous encounter notes.  IMarcille Blanco, CMA, am acting as transcriptionist for Starlyn Skeans, MD  I have reviewed the above documentation for accuracy and completeness, and I agree with the above. -  Dennard Nip, MD

## 2019-03-16 ENCOUNTER — Other Ambulatory Visit (INDEPENDENT_AMBULATORY_CARE_PROVIDER_SITE_OTHER): Payer: Self-pay | Admitting: Family Medicine

## 2019-03-16 DIAGNOSIS — E559 Vitamin D deficiency, unspecified: Secondary | ICD-10-CM

## 2019-03-21 ENCOUNTER — Other Ambulatory Visit: Payer: Self-pay

## 2019-03-21 ENCOUNTER — Encounter (INDEPENDENT_AMBULATORY_CARE_PROVIDER_SITE_OTHER): Payer: Self-pay | Admitting: Family Medicine

## 2019-03-21 ENCOUNTER — Ambulatory Visit (INDEPENDENT_AMBULATORY_CARE_PROVIDER_SITE_OTHER): Payer: BC Managed Care – PPO | Admitting: Family Medicine

## 2019-03-21 VITALS — BP 127/74 | HR 74 | Temp 98.4°F | Ht 62.0 in | Wt 187.0 lb

## 2019-03-21 DIAGNOSIS — Z6834 Body mass index (BMI) 34.0-34.9, adult: Secondary | ICD-10-CM

## 2019-03-21 DIAGNOSIS — Z9189 Other specified personal risk factors, not elsewhere classified: Secondary | ICD-10-CM | POA: Diagnosis not present

## 2019-03-21 DIAGNOSIS — F3289 Other specified depressive episodes: Secondary | ICD-10-CM

## 2019-03-21 DIAGNOSIS — E669 Obesity, unspecified: Secondary | ICD-10-CM | POA: Diagnosis not present

## 2019-03-21 DIAGNOSIS — E559 Vitamin D deficiency, unspecified: Secondary | ICD-10-CM

## 2019-03-21 MED ORDER — VITAMIN D (ERGOCALCIFEROL) 1.25 MG (50000 UNIT) PO CAPS: ORAL_CAPSULE | ORAL | 0 refills | Status: DC

## 2019-03-21 MED ORDER — BUPROPION HCL ER (SR) 200 MG PO TB12: 200.0000 mg | ORAL_TABLET | Freq: Every day | ORAL | 0 refills | Status: DC

## 2019-03-21 NOTE — Progress Notes (Signed)
Chief Complaint:   OBESITY Grace Horton is here to discuss her progress with her obesity treatment plan along with follow-up of her obesity related diagnoses. Grace Horton is on the [place] and states she is following her eating plan approximately 90% of the time. Grace Horton states she is doing [employer], bands, and recumbent bike for 30-50 minutes 7 times per week.  Today's visit was #: 51 Starting weight: 219 lbs Starting date: 06/25/16 Today's weight: 187 lbs Today's date: 03/21/2019 Total lbs lost to date: 32 Total lbs lost since last in-office visit: 2  Interim History: Grace Horton continues to do well with weight loss. She is happy with her Paleo plan, and her hunger is controlled. She is exercising daily now and she feels well overall.  Subjective:   1. Vitamin D deficiency Grace Horton's Vit D level has decreased since the Summer, but it is still close to goal. She denies nausea, vomiting, or muscle weakness.  2. Other depression, with emotional eating  Grace Horton's mood is stable on her medications, and she feels she is doing well with controlling her emotional eating. Her blood pressure is stable.  3. At risk for hypoglycemia Grace Horton is at increased risk for hypoglycemia due to history of pre-diabetes. Grace Horton is currently taking insulin.   Assessment/Plan:   1. Vitamin D deficiency Low Vitamin D level contributes to fatigue and are associated with obesity, breast, and colon cancer. We will refill prescription Vitamin D for 1 month. Grace Horton will follow-up for routine testing of Vitamin D, at least 2-3 times per year to avoid over-replacement. We will recheck labs in 3 months.  - Vitamin D, Ergocalciferol, (DRISDOL) 1.25 MG (50000 UNIT) CAPS capsule; TAKE 1 CAPSULE BY MOUTH Monthly  Dispense: 1 capsule; Refill: 0  2. Other depression, with emotional eating  Behavior modification techniques were discussed today to help Grace Horton deal with her emotional/non-hunger eating behaviors. We will  refill Wellbutrin for 1 month. Orders and follow up as documented in patient record.   - buPROPion (WELLBUTRIN SR) 200 MG 12 hr tablet; Take 1 tablet (200 mg total) by mouth daily before breakfast.  Dispense: 30 tablet; Refill: 0  3. At risk for hypoglycemia Grace Horton was given approximately 15 minutes of counseling today regarding prevention of hypoglycemia. She was advised of symptoms of hypoglycemia. Grace Horton was instructed to avoid skipping meals, eat regular protein rich meals and schedule low calorie snacks as needed.   Repetitive spaced learning was employed today to elicit superior memory formation and behavioral change.  4. Class 1 obesity with serious comorbidity and body mass index (BMI) of 34.0 to 34.9 in adult, unspecified obesity type Grace Horton is currently in the action stage of change. As such, her goal is to continue with weight loss efforts. She has agreed to follow the Paleo diet.   Behavioral modification strategies: increasing lean protein intake.  Grace Horton has agreed to follow-up with our clinic in 4 weeks. She was informed of the importance of frequent follow-up visits to maximize her success with intensive lifestyle modifications for her multiple health conditions.   Objective:   Blood pressure 127/74, pulse 74, temperature 98.4 F (36.9 C), temperature source Oral, height 5\' 2"  (1.575 m), weight 187 lb (84.8 kg), SpO2 99 %. Body mass index is 34.2 kg/m.  General: Cooperative, alert, well developed, in no acute distress. HEENT: Conjunctivae and lids unremarkable. Cardiovascular: Regular rhythm.  Lungs: Normal work of breathing. Neurologic: No focal deficits.   Lab Results  Component Value Date  CREATININE 0.76 02/22/2019   BUN 15 02/22/2019   NA 140 02/22/2019   K 4.6 02/22/2019   CL 104 02/22/2019   CO2 23 02/22/2019   Lab Results  Component Value Date   ALT 10 02/22/2019   AST 15 02/22/2019   ALKPHOS 67 02/22/2019   BILITOT 0.3 02/22/2019   Lab Results   Component Value Date   HGBA1C 5.2 02/22/2019   HGBA1C 5.0 09/28/2018   HGBA1C 5.2 11/17/2017   HGBA1C 5.1 06/11/2017   HGBA1C 5.5 02/17/2017   Lab Results  Component Value Date   INSULIN 7.7 02/22/2019   INSULIN 13.9 09/28/2018   INSULIN 8.8 11/17/2017   INSULIN 6.5 06/11/2017   INSULIN 12.3 02/17/2017   Lab Results  Component Value Date   TSH 2.330 09/28/2018   Lab Results  Component Value Date   CHOL 173 02/22/2019   HDL 43 02/22/2019   LDLCALC 119 (H) 02/22/2019   TRIG 58 02/22/2019   Lab Results  Component Value Date   WBC 6.0 11/17/2017   HGB 12.8 11/17/2017   HCT 37.7 11/17/2017   MCV 92 11/17/2017   PLT 317 05/27/2012   No results found for: IRON, TIBC, FERRITIN  Attestation Statements:   Reviewed by clinician on day of visit: allergies, medications, problem list, medical history, surgical history, family history, social history, and previous encounter notes.   I, Trixie Dredge, am acting as transcriptionist for Dennard Nip, MD.  I have reviewed the above documentation for accuracy and completeness, and I agree with the above. -  Dennard Nip, MD

## 2019-04-19 ENCOUNTER — Ambulatory Visit (INDEPENDENT_AMBULATORY_CARE_PROVIDER_SITE_OTHER): Payer: BC Managed Care – PPO | Admitting: Family Medicine

## 2019-04-19 ENCOUNTER — Other Ambulatory Visit: Payer: Self-pay

## 2019-04-19 ENCOUNTER — Encounter (INDEPENDENT_AMBULATORY_CARE_PROVIDER_SITE_OTHER): Payer: Self-pay | Admitting: Family Medicine

## 2019-04-19 VITALS — BP 150/80 | HR 77 | Temp 98.1°F | Ht 62.0 in | Wt 184.0 lb

## 2019-04-19 DIAGNOSIS — Z9189 Other specified personal risk factors, not elsewhere classified: Secondary | ICD-10-CM

## 2019-04-19 DIAGNOSIS — E669 Obesity, unspecified: Secondary | ICD-10-CM

## 2019-04-19 DIAGNOSIS — F3289 Other specified depressive episodes: Secondary | ICD-10-CM

## 2019-04-19 DIAGNOSIS — E559 Vitamin D deficiency, unspecified: Secondary | ICD-10-CM

## 2019-04-19 DIAGNOSIS — R030 Elevated blood-pressure reading, without diagnosis of hypertension: Secondary | ICD-10-CM | POA: Diagnosis not present

## 2019-04-19 DIAGNOSIS — Z6833 Body mass index (BMI) 33.0-33.9, adult: Secondary | ICD-10-CM

## 2019-04-19 MED ORDER — VITAMIN D (ERGOCALCIFEROL) 1.25 MG (50000 UNIT) PO CAPS: ORAL_CAPSULE | ORAL | 0 refills | Status: DC

## 2019-04-19 MED ORDER — BUPROPION HCL ER (SR) 200 MG PO TB12: 200.0000 mg | ORAL_TABLET | Freq: Every day | ORAL | 0 refills | Status: DC

## 2019-04-19 NOTE — Progress Notes (Signed)
Chief Complaint:   OBESITY Grace Horton is here to discuss her progress with her obesity treatment plan along with follow-up of her obesity related diagnoses. Grace Horton is on Paleo and states she is following her eating plan approximately 75% of the time. Grace Horton states she is walking, doing videos, and on the treadmill for 30-60 minutes 7 times per week.  Today's visit was #: 68 Starting weight: 219 lbs Starting date: 06/25/2016 Today's weight: 184 lbs Today's date: 04/19/2019 Total lbs lost to date: 35 Total lbs lost since last in-office visit: 3  Interim History: Grace Horton continues to do well with weight loss, and she is tolerating Saxenda well. Her hunger is controlled, and she is doing better with meal planning and avoiding simple carbohydrates.  Subjective:   1. Other depression, with emotional eating  Grace Horton is stable on Wellbutrin. Her blood pressure is elevated today, and it is normally not.  2. Vitamin D deficiency Grace Horton's Vit D level is close to goal. She denies nausea, vomiting, or muscle weakness.  3. Elevated blood pressure reading Grace Horton's blood pressure is elevated today. She has no reason why, it is normally well controlled.  4. At risk for hypertension The patient is at a higher than average risk of hypertension due to elevated blood pressure.  Assessment/Plan:   1. Other depression, with emotional eating  Behavior modification techniques were discussed today to help Grace Horton deal with her emotional/non-hunger eating behaviors. We will refill Wellbutrin for 1 month. We will recheck her blood pressure at her next visit, and she is to check her blood pressure at home. Orders and follow up as documented in patient record.   - buPROPion (WELLBUTRIN SR) 200 MG 12 hr tablet; Take 1 tablet (200 mg total) by mouth daily before breakfast.  Dispense: 30 tablet; Refill: 0  2. Vitamin D deficiency Low Vitamin D level contributes to fatigue and are associated with obesity, breast,  and colon cancer. We will refill prescription Vitamin D for 1 month. Grace Horton will follow-up for routine testing of Vitamin D, at least 2-3 times per year to avoid over-replacement. We will recheck labs in 1 month.  - Vitamin D, Ergocalciferol, (DRISDOL) 1.25 MG (50000 UNIT) CAPS capsule; TAKE 1 CAPSULE BY MOUTH Monthly  Dispense: 1 capsule; Refill: 0  3. Elevated blood pressure reading We will continue to monitor, and Grace Horton is to check her blood pressure at home. We will recheck labs in 1 month.  4. At risk for hypertension Grace Horton was given approximately 15 minutes of hypertension prevention counseling today. Grace Horton is at risk for hypertension due to obesity. We discussed intensive lifestyle modifications today with an emphasis on weight loss as well as increasing exercise and decreasing salt intake.  Repetitive spaced learning was employed today to elicit superior memory formation and behavioral change.  5. Class 1 obesity with serious comorbidity and body mass index (BMI) of 33.0 to 33.9 in adult, unspecified obesity type Grace Horton is currently in the action stage of change. As such, her goal is to continue with weight loss efforts. She has agreed to continue following the Paleo plan.   Exercise goals: As is.  Behavioral modification strategies: increasing lean protein intake.  Grace Horton has agreed to follow-up with our clinic in 4 weeks. She was informed of the importance of frequent follow-up visits to maximize her success with intensive lifestyle modifications for her multiple health conditions.   Objective:   Blood pressure (!) 150/80, pulse 77, temperature 98.1 F (36.7 C), temperature source  Oral, height 5\' 2"  (1.575 m), weight 184 lb (83.5 kg), SpO2 97 %. Body mass index is 33.65 kg/m.  General: Cooperative, alert, well developed, in no acute distress. HEENT: Conjunctivae and lids unremarkable. Cardiovascular: Regular rhythm.  Lungs: Normal work of breathing. Neurologic: No  focal deficits.   Lab Results  Component Value Date   CREATININE 0.76 02/22/2019   BUN 15 02/22/2019   NA 140 02/22/2019   K 4.6 02/22/2019   CL 104 02/22/2019   CO2 23 02/22/2019   Lab Results  Component Value Date   ALT 10 02/22/2019   AST 15 02/22/2019   ALKPHOS 67 02/22/2019   BILITOT 0.3 02/22/2019   Lab Results  Component Value Date   HGBA1C 5.2 02/22/2019   HGBA1C 5.0 09/28/2018   HGBA1C 5.2 11/17/2017   HGBA1C 5.1 06/11/2017   HGBA1C 5.5 02/17/2017   Lab Results  Component Value Date   INSULIN 7.7 02/22/2019   INSULIN 13.9 09/28/2018   INSULIN 8.8 11/17/2017   INSULIN 6.5 06/11/2017   INSULIN 12.3 02/17/2017   Lab Results  Component Value Date   TSH 2.330 09/28/2018   Lab Results  Component Value Date   CHOL 173 02/22/2019   HDL 43 02/22/2019   LDLCALC 119 (H) 02/22/2019   TRIG 58 02/22/2019   Lab Results  Component Value Date   WBC 6.0 11/17/2017   HGB 12.8 11/17/2017   HCT 37.7 11/17/2017   MCV 92 11/17/2017   PLT 317 05/27/2012   No results found for: IRON, TIBC, FERRITIN  Attestation Statements:   Reviewed by clinician on day of visit: allergies, medications, problem list, medical history, surgical history, family history, social history, and previous encounter notes.   I, Trixie Dredge, am acting as transcriptionist for Dennard Nip, MD.  I have reviewed the above documentation for accuracy and completeness, and I agree with the above. -  Dennard Nip, MD

## 2019-04-20 ENCOUNTER — Other Ambulatory Visit (INDEPENDENT_AMBULATORY_CARE_PROVIDER_SITE_OTHER): Payer: Self-pay | Admitting: Family Medicine

## 2019-04-20 DIAGNOSIS — E559 Vitamin D deficiency, unspecified: Secondary | ICD-10-CM

## 2019-05-18 ENCOUNTER — Ambulatory Visit (INDEPENDENT_AMBULATORY_CARE_PROVIDER_SITE_OTHER): Payer: BC Managed Care – PPO | Admitting: Family Medicine

## 2019-05-19 ENCOUNTER — Other Ambulatory Visit (INDEPENDENT_AMBULATORY_CARE_PROVIDER_SITE_OTHER): Payer: Self-pay | Admitting: Family Medicine

## 2019-05-19 DIAGNOSIS — E559 Vitamin D deficiency, unspecified: Secondary | ICD-10-CM

## 2019-05-26 ENCOUNTER — Ambulatory Visit (INDEPENDENT_AMBULATORY_CARE_PROVIDER_SITE_OTHER): Payer: BC Managed Care – PPO | Admitting: Physician Assistant

## 2019-05-26 ENCOUNTER — Other Ambulatory Visit: Payer: Self-pay

## 2019-05-26 ENCOUNTER — Encounter (INDEPENDENT_AMBULATORY_CARE_PROVIDER_SITE_OTHER): Payer: Self-pay | Admitting: Physician Assistant

## 2019-05-26 VITALS — BP 155/75 | HR 83 | Temp 98.6°F | Ht 62.0 in | Wt 185.0 lb

## 2019-05-26 DIAGNOSIS — E559 Vitamin D deficiency, unspecified: Secondary | ICD-10-CM

## 2019-05-26 DIAGNOSIS — E669 Obesity, unspecified: Secondary | ICD-10-CM | POA: Diagnosis not present

## 2019-05-26 DIAGNOSIS — Z6833 Body mass index (BMI) 33.0-33.9, adult: Secondary | ICD-10-CM

## 2019-05-26 DIAGNOSIS — F3289 Other specified depressive episodes: Secondary | ICD-10-CM | POA: Diagnosis not present

## 2019-05-26 DIAGNOSIS — Z9189 Other specified personal risk factors, not elsewhere classified: Secondary | ICD-10-CM | POA: Diagnosis not present

## 2019-05-26 MED ORDER — VITAMIN D (ERGOCALCIFEROL) 1.25 MG (50000 UNIT) PO CAPS: ORAL_CAPSULE | ORAL | 0 refills | Status: DC

## 2019-05-26 MED ORDER — BUPROPION HCL ER (SR) 200 MG PO TB12: 200.0000 mg | ORAL_TABLET | Freq: Every day | ORAL | 0 refills | Status: DC

## 2019-05-26 MED ORDER — BD PEN NEEDLE NANO U/F 32G X 4 MM MISC: 1.0000 | Freq: Every day | 0 refills | Status: DC

## 2019-05-26 NOTE — Progress Notes (Signed)
Chief Complaint:   OBESITY Grace Horton is here to discuss her progress with her obesity treatment plan along with follow-up of her obesity related diagnoses. Grace Horton is on following a lower carbohydrate, vegetable and lean protein rich diet plan and states she is following her eating plan approximately 70% of the time. Grace Horton states she is walking for 30-45 minutes 3 times per week.  Today's visit was #: 11 Starting weight: 219 lbs Starting date: 06/25/2016 Today's weight: 185 lbs Today's date: 05/26/2019 Total lbs lost to date: 34 lbs Total lbs lost since last in-office visit: 1 lb  Interim History: Grace Horton states that she has not been following the plan as closely recently.  She has noticed an increase in her appetite since getting 2 steroid injections in her feet.  She wants to try journaling again.  Subjective:   1. Vitamin D deficiency Grace Horton's Vitamin D level was 46.1 on 02/22/2019. She is currently taking prescription vitamin D 50,000 IU once monthly.  She denies nausea, vomiting or muscle weakness.  2. Other depression, with emotional eating  No SI/HI.  No cravings. She is on bupropion 200 mg daily.   3. At risk for osteoporosis Grace Horton is at higher risk of osteopenia and osteoporosis due to Vitamin D deficiency.   Assessment/Plan:   1. Vitamin D deficiency Low Vitamin D level contributes to fatigue and are associated with obesity, breast, and colon cancer. She agrees to continue to take prescription Vitamin D @50 ,000 IU once a month and will follow-up for routine testing of Vitamin D, at least 2-3 times per year to avoid over-replacement. - Vitamin D, Ergocalciferol, (DRISDOL) 1.25 MG (50000 UNIT) CAPS capsule; TAKE 1 CAPSULE BY MOUTH Monthly  Dispense: 1 capsule; Refill: 0  2. Other depression, with emotional eating  Behavior modification techniques were discussed today to help Grace Horton deal with her emotional/non-hunger eating behaviors.  Orders and follow up as documented in  patient record.  - buPROPion (WELLBUTRIN SR) 200 MG 12 hr tablet; Take 1 tablet (200 mg total) by mouth daily before breakfast.  Dispense: 30 tablet; Refill: 0  3. At risk for osteoporosis Grace Horton was given approximately 15 minutes of osteoporosis prevention counseling today. Grace Horton is at risk for osteopenia and osteoporosis due to her Vitamin D deficiency. She was encouraged to take her Vitamin D and follow her higher calcium diet and increase strengthening exercise to help strengthen her bones and decrease her risk of osteopenia and osteoporosis.  Repetitive spaced learning was employed today to elicit superior memory formation and behavioral change.  4. Class 1 obesity with serious comorbidity and body mass index (BMI) of 33.0 to 33.9 in adult, unspecified obesity type Grace Horton is currently in the action stage of change. As such, her goal is to continue with weight loss efforts. She has agreed to keeping a food journal and adhering to recommended goals of 1300-1500 calories and 90 grams of protein daily.   Exercise goals: As is.  Behavioral modification strategies: meal planning and cooking strategies and keeping healthy foods in the home.  Grace Horton has agreed to follow-up with our clinic in 4 weeks. She was informed of the importance of frequent follow-up visits to maximize her success with intensive lifestyle modifications for her multiple health conditions.   Objective:   Blood pressure (!) 155/75, pulse 83, temperature 98.6 F (37 C), temperature source Oral, height 5\' 2"  (1.575 m), weight 185 lb (83.9 kg), SpO2 100 %. Body mass index is 33.84 kg/m.  General: Cooperative, alert,  well developed, in no acute distress. HEENT: Conjunctivae and lids unremarkable. Cardiovascular: Regular rhythm.  Lungs: Normal work of breathing. Neurologic: No focal deficits.   Lab Results  Component Value Date   CREATININE 0.76 02/22/2019   BUN 15 02/22/2019   NA 140 02/22/2019   K 4.6 02/22/2019    CL 104 02/22/2019   CO2 23 02/22/2019   Lab Results  Component Value Date   ALT 10 02/22/2019   AST 15 02/22/2019   ALKPHOS 67 02/22/2019   BILITOT 0.3 02/22/2019   Lab Results  Component Value Date   HGBA1C 5.2 02/22/2019   HGBA1C 5.0 09/28/2018   HGBA1C 5.2 11/17/2017   HGBA1C 5.1 06/11/2017   HGBA1C 5.5 02/17/2017   Lab Results  Component Value Date   INSULIN 7.7 02/22/2019   INSULIN 13.9 09/28/2018   INSULIN 8.8 11/17/2017   INSULIN 6.5 06/11/2017   INSULIN 12.3 02/17/2017   Lab Results  Component Value Date   TSH 2.330 09/28/2018   Lab Results  Component Value Date   CHOL 173 02/22/2019   HDL 43 02/22/2019   LDLCALC 119 (H) 02/22/2019   TRIG 58 02/22/2019   Lab Results  Component Value Date   WBC 6.0 11/17/2017   HGB 12.8 11/17/2017   HCT 37.7 11/17/2017   MCV 92 11/17/2017   PLT 317 05/27/2012   Attestation Statements:   Reviewed by clinician on day of visit: allergies, medications, problem list, medical history, surgical history, family history, social history, and previous encounter notes.  I, Water quality scientist, CMA, am acting as Location manager for [employer], PA-C.  I have reviewed the above documentation for accuracy and completeness, and I agree with the above. Abby Potash, PA-C

## 2019-06-21 ENCOUNTER — Other Ambulatory Visit (INDEPENDENT_AMBULATORY_CARE_PROVIDER_SITE_OTHER): Payer: Self-pay | Admitting: Physician Assistant

## 2019-06-21 DIAGNOSIS — E559 Vitamin D deficiency, unspecified: Secondary | ICD-10-CM

## 2019-06-23 ENCOUNTER — Other Ambulatory Visit: Payer: Self-pay

## 2019-06-23 ENCOUNTER — Ambulatory Visit (INDEPENDENT_AMBULATORY_CARE_PROVIDER_SITE_OTHER): Payer: BC Managed Care – PPO | Admitting: Family Medicine

## 2019-06-23 ENCOUNTER — Encounter (INDEPENDENT_AMBULATORY_CARE_PROVIDER_SITE_OTHER): Payer: Self-pay | Admitting: Family Medicine

## 2019-06-23 VITALS — BP 153/79 | HR 75 | Temp 98.0°F | Ht 62.0 in | Wt 186.0 lb

## 2019-06-23 DIAGNOSIS — E559 Vitamin D deficiency, unspecified: Secondary | ICD-10-CM | POA: Diagnosis not present

## 2019-06-23 DIAGNOSIS — Z6834 Body mass index (BMI) 34.0-34.9, adult: Secondary | ICD-10-CM

## 2019-06-23 DIAGNOSIS — F3289 Other specified depressive episodes: Secondary | ICD-10-CM

## 2019-06-23 DIAGNOSIS — E669 Obesity, unspecified: Secondary | ICD-10-CM

## 2019-06-23 DIAGNOSIS — Z9189 Other specified personal risk factors, not elsewhere classified: Secondary | ICD-10-CM | POA: Diagnosis not present

## 2019-06-23 MED ORDER — VITAMIN D (ERGOCALCIFEROL) 1.25 MG (50000 UNIT) PO CAPS: ORAL_CAPSULE | ORAL | 0 refills | Status: DC

## 2019-06-23 MED ORDER — BUPROPION HCL ER (SR) 200 MG PO TB12: 200.0000 mg | ORAL_TABLET | Freq: Every day | ORAL | 0 refills | Status: DC

## 2019-06-27 NOTE — Progress Notes (Signed)
Chief Complaint:   OBESITY Grace Horton is here to discuss her progress with her obesity treatment plan along with follow-up of her obesity related diagnoses. Grace Horton is on keeping a food journal and adhering to recommended goals of 1300-1500 calories and 90 grams of protein daily and states she is following her eating plan approximately 50% of the time. Grace Horton states she is walking for 45-60 minutes 2 times per week.  Today's visit was #: 72 Starting weight: 219 lbs Starting date: 06/25/2016 Today's weight: 186 lbs Today's date: 06/23/2019 Total lbs lost to date: 33 Total lbs lost since last in-office visit: 0  Interim History: Grace Horton has been unable to exercise regularly due to right foot pain related to neuroma digital nerve pain. Last cortisone injection was April 2021 without symptom relief (total of 3 injections).  Subjective:   1. Vitamin D deficiency [name]'s Vit D level on 03/03/2019 was 46.1. She is on prescription strength Vit D supplementation, and is tolerating it well.  2. Other depression, with emotional eating  [name] reports stable mood and she denies suicidal ideas or homicidal ideas. She is on bupropion SR 200 mg q daily.  3. At risk for activity intolerance Grace Horton is at risk for exercise intolerance due to chronic right foot pain.  Assessment/Plan:   1. Vitamin D deficiency Low Vitamin D level contributes to fatigue and are associated with obesity, breast, and colon cancer. We will refill prescription Vitamin D for 1 month. Grace Horton will follow-up for routine testing of Vitamin D, at least 2-3 times per year to avoid over-replacement. We will recheck labs at her next office visit.  - Vitamin D, Ergocalciferol, (DRISDOL) 1.25 MG (50000 UNIT) CAPS capsule; TAKE 1 CAPSULE BY MOUTH weekly  Dispense: 4 capsule; Refill: 0  2. Other depression, with emotional eating  Behavior modification techniques were discussed today to help Grace Horton deal with her emotional/non-hunger  eating behaviors. We will refill bupropion SR for 1 month. Orders and follow up as documented in patient record.   - buPROPion (WELLBUTRIN SR) 200 MG 12 hr tablet; Take 1 tablet (200 mg total) by mouth daily before breakfast.  Dispense: 30 tablet; Refill: 0  3. At risk for activity intolerance Grace Horton was given approximately 15 minutes of exercise intolerance counseling today. She is 49 y.o. female and has risk factors exercise intolerance including obesity and chronic right foot pain. We provided contact information of [facility] for evaluation and treatment. We discussed intensive lifestyle modifications today with an emphasis on specific weight loss instructions and strategies. Grace Horton will slowly increase activity as tolerated.  Repetitive spaced learning was employed today to elicit superior memory formation and behavioral change.  4. Class 1 obesity with serious comorbidity and body mass index (BMI) of 34.0 to 34.9 in adult, unspecified obesity type Grace Horton is currently in the action stage of change. As such, her goal is to continue with weight loss efforts. She has agreed to follow the Paleo diet with 90+ grams of protein daily.   We discussed various medication options to help Grace Horton with her weight loss efforts and we both agreed to increase Saxenda to 1.8 mg q daily, no refill is required at this time.  Exercise goals: As is.  Behavioral modification strategies: increasing lean protein intake, decreasing simple carbohydrates, no skipping meals, meal planning and cooking strategies and better snacking choices.  [name] has agreed to follow-up with our clinic in 3 weeks. She was informed of the importance of frequent follow-up visits  to maximize her success with intensive lifestyle modifications for her multiple health conditions.   Objective:   Blood pressure (!) 153/79, pulse 75, temperature 98 F (36.7 C), temperature source Oral, height 5\' 2"  (1.575 m), weight 186 lb (84.4  kg), SpO2 98 %. Body mass index is 34.02 kg/m.  General: Cooperative, alert, well developed, in no acute distress. HEENT: Conjunctivae and lids unremarkable. Cardiovascular: Regular rhythm.  Lungs: Normal work of breathing. Neurologic: No focal deficits.   Lab Results  Component Value Date   CREATININE 0.76 02/22/2019   BUN 15 02/22/2019   NA 140 02/22/2019   K 4.6 02/22/2019   CL 104 02/22/2019   CO2 23 02/22/2019   Lab Results  Component Value Date   ALT 10 02/22/2019   AST 15 02/22/2019   ALKPHOS 67 02/22/2019   BILITOT 0.3 02/22/2019   Lab Results  Component Value Date   HGBA1C 5.2 02/22/2019   HGBA1C 5.0 09/28/2018   HGBA1C 5.2 11/17/2017   HGBA1C 5.1 06/11/2017   HGBA1C 5.5 02/17/2017   Lab Results  Component Value Date   INSULIN 7.7 02/22/2019   INSULIN 13.9 09/28/2018   INSULIN 8.8 11/17/2017   INSULIN 6.5 06/11/2017   INSULIN 12.3 02/17/2017   Lab Results  Component Value Date   TSH 2.330 09/28/2018   Lab Results  Component Value Date   CHOL 173 02/22/2019   HDL 43 02/22/2019   LDLCALC 119 (H) 02/22/2019   TRIG 58 02/22/2019   Lab Results  Component Value Date   WBC 6.0 11/17/2017   HGB 12.8 11/17/2017   HCT 37.7 11/17/2017   MCV 92 11/17/2017   PLT 317 05/27/2012   No results found for: IRON, TIBC, FERRITIN  Attestation Statements:   Reviewed by clinician on day of visit: allergies, medications, problem list, medical history, surgical history, family history, social history, and previous encounter notes.   I, Trixie Dredge, am acting as transcriptionist for Dennard Nip, MD.  I have reviewed the above documentation for accuracy and completeness, and I agree with the above. -  Dennard Nip, MD

## 2019-07-19 ENCOUNTER — Ambulatory Visit: Payer: BC Managed Care – PPO

## 2019-07-19 ENCOUNTER — Other Ambulatory Visit: Payer: Self-pay | Admitting: Podiatry

## 2019-07-19 ENCOUNTER — Ambulatory Visit: Payer: BC Managed Care – PPO | Admitting: Podiatry

## 2019-07-19 ENCOUNTER — Other Ambulatory Visit: Payer: Self-pay

## 2019-07-19 DIAGNOSIS — D3610 Benign neoplasm of peripheral nerves and autonomic nervous system, unspecified: Secondary | ICD-10-CM | POA: Diagnosis not present

## 2019-07-19 DIAGNOSIS — G8929 Other chronic pain: Secondary | ICD-10-CM

## 2019-07-19 DIAGNOSIS — S91312A Laceration without foreign body, left foot, initial encounter: Secondary | ICD-10-CM

## 2019-07-19 DIAGNOSIS — M79671 Pain in right foot: Secondary | ICD-10-CM

## 2019-07-20 ENCOUNTER — Encounter (INDEPENDENT_AMBULATORY_CARE_PROVIDER_SITE_OTHER): Payer: Self-pay | Admitting: Family Medicine

## 2019-07-20 ENCOUNTER — Other Ambulatory Visit: Payer: Self-pay

## 2019-07-20 ENCOUNTER — Ambulatory Visit (INDEPENDENT_AMBULATORY_CARE_PROVIDER_SITE_OTHER): Payer: BC Managed Care – PPO | Admitting: Family Medicine

## 2019-07-20 VITALS — BP 125/84 | HR 76 | Temp 98.1°F | Ht 62.0 in | Wt 186.0 lb

## 2019-07-20 DIAGNOSIS — E8881 Metabolic syndrome: Secondary | ICD-10-CM

## 2019-07-20 DIAGNOSIS — Z6833 Body mass index (BMI) 33.0-33.9, adult: Secondary | ICD-10-CM

## 2019-07-20 DIAGNOSIS — F3289 Other specified depressive episodes: Secondary | ICD-10-CM | POA: Diagnosis not present

## 2019-07-20 DIAGNOSIS — Z9189 Other specified personal risk factors, not elsewhere classified: Secondary | ICD-10-CM

## 2019-07-20 DIAGNOSIS — E559 Vitamin D deficiency, unspecified: Secondary | ICD-10-CM | POA: Diagnosis not present

## 2019-07-20 DIAGNOSIS — E7849 Other hyperlipidemia: Secondary | ICD-10-CM | POA: Diagnosis not present

## 2019-07-20 DIAGNOSIS — E669 Obesity, unspecified: Secondary | ICD-10-CM

## 2019-07-20 DIAGNOSIS — D3613 Benign neoplasm of peripheral nerves and autonomic nervous system of lower limb, including hip: Secondary | ICD-10-CM

## 2019-07-20 MED ORDER — BD PEN NEEDLE NANO U/F 32G X 4 MM MISC: 1.0000 | Freq: Every day | 0 refills | Status: DC

## 2019-07-20 MED ORDER — BUPROPION HCL ER (SR) 200 MG PO TB12: 200.0000 mg | ORAL_TABLET | Freq: Every day | ORAL | 0 refills | Status: DC

## 2019-07-20 MED ORDER — VITAMIN D (ERGOCALCIFEROL) 1.25 MG (50000 UNIT) PO CAPS: ORAL_CAPSULE | ORAL | 0 refills | Status: DC

## 2019-07-20 MED ORDER — SAXENDA 18 MG/3ML ~~LOC~~ SOPN: 3.0000 mg | PEN_INJECTOR | Freq: Every day | SUBCUTANEOUS | 0 refills | Status: DC

## 2019-07-20 NOTE — Progress Notes (Signed)
Chief Complaint:   OBESITY Grace Horton is here to discuss her progress with her obesity treatment plan along with follow-up of her obesity related diagnoses. Grace Horton is on Paleo diet with 90+ grams of protein and states she is following her eating plan approximately 75% of the time. Grace Horton states she is doing 0 minutes 0 times per week.  Today's visit was #: 47 Starting weight: 219 lbs Starting date: 06/25/2016 Today's weight: 186 lbs Today's date: 07/20/2019 Total lbs lost to date: 33 Total lbs lost since last in-office visit: 0  Interim History: Grace Horton is using My[employer]itness Pal but the last few weeks she has not been tracking. She is getting 70+ grams of protein most days. She is struggling with breakfast options and is often skipping meals. She was recently at the beach for 1 month, and found it challenging than usual and wasn't as strict with her diet as usual.  Subjective:   1. Neuroma of foot right Grace Horton is following up with a specialist and is following advice and treatment plan per their recent visit.     2. Other hyperlipidemia Grace Horton has hyperlipidemia and has been trying to improve her cholesterol levels with intensive lifestyle modification including a low saturated fat diet, exercise and weight loss. Last LDL was elevated at 119. She denies any chest pain, claudication or myalgias.  3. Vitamin D deficiency Grace Horton is tolerating Vit D well. In the summer she is using 1 tablet per month due to excessive sun exposure.   4. Other depression, with emotional eating  Grace Horton is doing well and is tolerating her medications well. She has no complaints or concerns.  5. Insulin resistance Grace Horton has a diagnosis of insulin resistance based on her elevated fasting insulin level >5. She continues to work on diet and exercise to decrease her risk of diabetes.  6. At risk for diabetes mellitus Grace Horton is at higher than average risk for developing diabetes due to her obesity.    Assessment/Plan:   1. Neuroma of foot right Grace Horton will continue to follow up with Dr. Earleen Newport, and she is to exercise as tolerated.  2. Other hyperlipidemia Cardiovascular risk and specific lipid/LDL goals reviewed. We discussed several lifestyle modifications today and counseling was provided. Grace Horton will continue to work on diet, exercise and weight loss efforts. We will check labs today. Orders and follow up as documented in patient record.   - Comprehensive metabolic panel - Lipid Panel With LDL/HDL Ratio  3. Vitamin D deficiency Low Vitamin D level contributes to fatigue and are associated with obesity, breast, and colon cancer. We will check labs today, and we will refill prescription Vitamin D for 1 month. Grace Horton will follow-up for routine testing of Vitamin D, at least 2-3 times per year to avoid over-replacement.   - VITAMIN D 25 Hydroxy (Vit-D Deficiency, Fractures)  - Vitamin D, Ergocalciferol, (DRISDOL) 1.25 MG (50000 UNIT) CAPS capsule; TAKE 1 CAPSULE BY MOUTH weekly  Dispense: 4 capsule; Refill: 0  4. Other depression, with emotional eating  Behavior modification techniques were discussed today to help Grace Horton deal with her emotional/non-hunger eating behaviors. We will refill Wellbutrin SR for 1 month. Orders and follow up as documented in patient record.   - buPROPion (WELLBUTRIN SR) 200 MG 12 hr tablet; Take 1 tablet (200 mg total) by mouth daily before breakfast.  Dispense: 30 tablet; Refill: 0  5. Insulin resistance Grace Horton will continue to work on weight loss, diet, exercise, and decreasing simple carbohydrates to help decrease  the risk of diabetes. We will check labs today. Grace Horton agreed to follow-up with [name] as directed to closely monitor her progress.  - Hemoglobin A1c - Insulin, random  6. At risk for diabetes mellitus Grace Horton was given approximately 30 minutes of diabetes education and counseling today. We discussed intensive lifestyle modifications today with  an emphasis on weight loss as well as increasing exercise and decreasing simple carbohydrates in her diet. We also reviewed medication options with an emphasis on risk versus benefit of those discussed.   Repetitive spaced learning was employed today to elicit superior memory formation and behavioral change.  7. Class 1 obesity with serious comorbidity and body mass index (BMI) of 33.0 to 33.9 in adult, unspecified obesity type Grace Horton is currently in the action stage of change. As such, her goal is to continue with weight loss efforts. She has agreed to [employer] with 90+ grams of protein.   We discussed various medication options to help Grace Horton with her weight loss efforts and we both agreed to continue Saxenda 3 mg daily and we will refill for 1 month, and we will refill pen needle #100 with no refills.  - Liraglutide -Weight Management (SAXENDA) 18 MG/3ML SOPN; Inject 0.5 mLs (3 mg total) into the skin daily.  Dispense: 5 pen; Refill: 0 - Insulin Pen Needle (BD PEN NEEDLE NANO U/F) 32G X 4 MM MISC; Inject 1 each into the skin daily.  Dispense: 100 each; Refill: 0  Exercise goals: All adults should avoid inactivity. Some physical activity is better than none, and adults who participate in any amount of physical activity gain some health benefits.  Behavioral modification strategies: increasing lean protein intake and travel eating strategies.  Grace Horton has agreed to follow-up with our clinic in 4 weeks. She was informed of the importance of frequent follow-up visits to maximize her success with intensive lifestyle modifications for her multiple health conditions.   Objective:   Blood pressure 125/84, pulse 76, temperature 98.1 F (36.7 C), temperature source Oral, height 5\' 2"  (1.575 m), weight 186 lb (84.4 kg), SpO2 98 %. Body mass index is 34.02 kg/m.  General: Cooperative, alert, well developed, in no acute distress. HEENT: Conjunctivae and lids unremarkable. Cardiovascular: Regular  rhythm.  Lungs: Normal work of breathing. Neurologic: No focal deficits.   Lab Results  Component Value Date   CREATININE 0.76 02/22/2019   BUN 15 02/22/2019   NA 140 02/22/2019   K 4.6 02/22/2019   CL 104 02/22/2019   CO2 23 02/22/2019   Lab Results  Component Value Date   ALT 10 02/22/2019   AST 15 02/22/2019   ALKPHOS 67 02/22/2019   BILITOT 0.3 02/22/2019   Lab Results  Component Value Date   HGBA1C 5.2 02/22/2019   HGBA1C 5.0 09/28/2018   HGBA1C 5.2 11/17/2017   HGBA1C 5.1 06/11/2017   HGBA1C 5.5 02/17/2017   Lab Results  Component Value Date   INSULIN 7.7 02/22/2019   INSULIN 13.9 09/28/2018   INSULIN 8.8 11/17/2017   INSULIN 6.5 06/11/2017   INSULIN 12.3 02/17/2017   Lab Results  Component Value Date   TSH 2.330 09/28/2018   Lab Results  Component Value Date   CHOL 173 02/22/2019   HDL 43 02/22/2019   LDLCALC 119 (H) 02/22/2019   TRIG 58 02/22/2019   Lab Results  Component Value Date   WBC 6.0 11/17/2017   HGB 12.8 11/17/2017   HCT 37.7 11/17/2017   MCV 92 11/17/2017   PLT 317 05/27/2012  No results found for: IRON, TIBC, FERRITIN  Attestation Statements:   Reviewed by clinician on day of visit: allergies, medications, problem list, medical history, surgical history, family history, social history, and previous encounter notes.   I, Trixie Dredge, am acting as transcriptionist for Dennard Nip, MD.  I have reviewed the above documentation for accuracy and completeness, and I agree with the above. -  Dennard Nip, MD

## 2019-07-21 ENCOUNTER — Telehealth: Payer: Self-pay | Admitting: *Deleted

## 2019-07-21 DIAGNOSIS — D3610 Benign neoplasm of peripheral nerves and autonomic nervous system, unspecified: Secondary | ICD-10-CM

## 2019-07-21 LAB — COMPREHENSIVE METABOLIC PANEL
ALT: 10 IU/L (ref 0–32)
AST: 13 IU/L (ref 0–40)
Albumin/Globulin Ratio: 1.8 (ref 1.2–2.2)
Albumin: 4.2 g/dL (ref 3.8–4.9)
Alkaline Phosphatase: 75 IU/L (ref 48–121)
BUN/Creatinine Ratio: 19 (ref 9–23)
BUN: 16 mg/dL (ref 6–24)
Bilirubin Total: 0.4 mg/dL (ref 0.0–1.2)
CO2: 22 mmol/L (ref 20–29)
Calcium: 9.1 mg/dL (ref 8.7–10.2)
Chloride: 106 mmol/L (ref 96–106)
Creatinine, Ser: 0.85 mg/dL (ref 0.57–1.00)
GFR calc Af Amer: 92 mL/min/{1.73_m2} (ref >59)
GFR calc non Af Amer: 80 mL/min/{1.73_m2} (ref >59)
Globulin, Total: 2.4 g/dL (ref 1.5–4.5)
Glucose: 81 mg/dL (ref 65–99)
Potassium: 4.6 mmol/L (ref 3.5–5.2)
Sodium: 141 mmol/L (ref 134–144)
Total Protein: 6.6 g/dL (ref 6.0–8.5)

## 2019-07-21 LAB — LIPID PANEL WITH LDL/HDL RATIO
Cholesterol, Total: 178 mg/dL (ref 100–199)
HDL: 48 mg/dL (ref >39)
LDL Chol Calc (NIH): 120 mg/dL — ABNORMAL HIGH (ref 0–99)
LDL/HDL Ratio: 2.5 ratio (ref 0.0–3.2)
Triglycerides: 51 mg/dL (ref 0–149)
VLDL Cholesterol Cal: 10 mg/dL (ref 5–40)

## 2019-07-21 LAB — INSULIN, RANDOM: INSULIN: 8 u[IU]/mL (ref 2.6–24.9)

## 2019-07-21 LAB — HEMOGLOBIN A1C
Est. average glucose Bld gHb Est-mCnc: 103 mg/dL
Hgb A1c MFr Bld: 5.2 % (ref 4.8–5.6)

## 2019-07-21 LAB — VITAMIN D 25 HYDROXY (VIT D DEFICIENCY, FRACTURES): Vit D, 25-Hydroxy: 41.4 ng/mL (ref 30.0–100.0)

## 2019-07-21 NOTE — Telephone Encounter (Signed)
-----   Message from Trula Slade, DPM sent at 07/19/2019  1:56 PM EDT ----- Can you please order diagnostic ultrasound of the right foot to evaluate neuroma? She has seen 2 other doctors without improvement, x-rays negative but I did not get new ones today.

## 2019-07-23 ENCOUNTER — Other Ambulatory Visit (INDEPENDENT_AMBULATORY_CARE_PROVIDER_SITE_OTHER): Payer: Self-pay | Admitting: Family Medicine

## 2019-07-23 DIAGNOSIS — E559 Vitamin D deficiency, unspecified: Secondary | ICD-10-CM

## 2019-07-24 NOTE — Progress Notes (Signed)
Subjective:   Patient ID: Grace Horton, female   DOB: 49 y.o.   MRN: [id number]   HPI 49 year old female presents the office today for concerns of a neuroma to the right foot.  She is previously had 2 other opinions.  She has had a couple of injections with have not been helpful.  This started to worsen in December 2020.  She gets numbness and cold sensation to her lesser toes.  There is to go barefoot and even when shoes.  She denies any recent injury or trauma.  She also has "oyster cuts" to both feet.  She states they are healing doing better and is having after she went to the beach last week.   Review of Systems  All other systems reviewed and are negative.  Past Medical History:  Diagnosis Date  . AC (acromioclavicular) joint bone spurs   . Anxiety   . Back pain   . Chondromalacia   . Depression   . GERD (gastroesophageal reflux disease)   . History of shingles 2011   Around Left Eye  . Joint pain   . Raynaud disease   . Swelling    in feet or legs  . Vitamin D deficiency     Past Surgical History:  Procedure Laterality Date  . CESAREAN SECTION    . SHOULDER SURGERY Left 2007  . TONSILLECTOMY       Current Outpatient Medications:  .  buPROPion (WELLBUTRIN SR) 200 MG 12 hr tablet, Take 1 tablet (200 mg total) by mouth daily before breakfast., Disp: 30 tablet, Rfl: 0 .  Diclofenac Sodium 2 % SOLN, Place 2 g onto the skin 2 (two) times daily., Disp: 112 g, Rfl: 3 .  fluticasone (FLONASE) 50 MCG/ACT nasal spray, 2 sprays in each nostril daily, Disp: 16 g, Rfl: 0 .  Insulin Pen Needle (BD PEN NEEDLE NANO U/F) 32G X 4 MM MISC, Inject 1 each into the skin daily., Disp: 100 each, Rfl: 0 .  levonorgestrel-ethinyl estradiol (AVIANE,ALESSE,LESSINA) 0.1-20 MG-MCG tablet, Take 1 tablet by mouth daily., Disp: , Rfl:  .  Liraglutide -Weight Management (SAXENDA) 18 MG/3ML SOPN, Inject 0.5 mLs (3 mg total) into the skin daily., Disp: 5 pen, Rfl: 0 .  Vitamin D, Ergocalciferol,  (DRISDOL) 1.25 MG (50000 UNIT) CAPS capsule, TAKE 1 CAPSULE BY MOUTH weekly, Disp: 4 capsule, Rfl: 0  Allergies  Allergen Reactions  . Latex Other (See Comments)  . Levonorgestrel-Ethinyl Estrad Other (See Comments)          Objective:  Physical Exam  General: AAO x3, NAD  Dermatological: Superficial laceration to the left heel with mild tenderness.  There is no drainage or pus.  The lesions on the right heel are doing better and was completely healed at this time.  No other open lesions are identified today.  Vascular: Dorsalis Pedis artery and Posterior Tibial artery pedal pulses are 2/4 bilateral with immedate capillary fill time. Pedal hair growth present. No varicosities and no lower extremity edema present bilateral. There is no pain with calf compression, swelling, warmth, erythema.   Neruologic: Grossly intact via light touch bilateral.  Negative Tinel sign.  Musculoskeletal: On the right side today I am not able to palpate a large neuroma there is no area pinpoint tenderness but there is discomfort on the third and fourth interspaces.  There is mild edema but there is no erythema or warmth.  Flexor, extensor tendons appear to be intact.  MMT 5/5.  Gait: Unassisted, Nonantalgic.  Assessment:   Chronic right foot pain, likely neuroma    Plan:  -Treatment options discussed including all alternatives, risks, and complications -Etiology of symptoms were discussed -I do not obtain new x-rays states she had several x-rays which were apparently negative.  Was able to review the chart and see the report from previous that did not show any evidence of acute fracture.  Given her longevity of symptoms despite conservative care including injection therapy, offloading she denies any relief and therefore will order an MRI of the right foot to further evaluate and for possible surgical planning.  For now dispensed offloading pads. -In regards to the cuts bilaterally appear to be  healing.  Recommend antibiotic ointment dressing changes daily.  Monitoring signs or symptoms of infection.    Trula Slade DPM

## 2019-07-25 ENCOUNTER — Other Ambulatory Visit: Payer: Self-pay | Admitting: Podiatry

## 2019-07-25 DIAGNOSIS — D3610 Benign neoplasm of peripheral nerves and autonomic nervous system, unspecified: Secondary | ICD-10-CM

## 2019-07-27 ENCOUNTER — Ambulatory Visit
Admission: RE | Admit: 2019-07-27 | Discharge: 2019-07-27 | Disposition: A | Discharge status: Home / Self Care | Payer: BC Managed Care – PPO | Source: Ambulatory Visit | Attending: Podiatry | Admitting: Podiatry

## 2019-07-27 DIAGNOSIS — D3610 Benign neoplasm of peripheral nerves and autonomic nervous system, unspecified: Secondary | ICD-10-CM

## 2019-07-29 ENCOUNTER — Telehealth: Payer: Self-pay | Admitting: *Deleted

## 2019-07-29 NOTE — Telephone Encounter (Signed)
Left message informing pt DR. Wagoner had left her a MyChart message and to call me and I would go over them with her.

## 2019-07-29 NOTE — Telephone Encounter (Signed)
-----   Message from Trula Slade, DPM sent at 07/29/2019  8:38 AM EDT ----- Mychart message sent to the patient but Val can you also please call her? Below is what I sent.   The ultrasound does not show any neuroma. The nerve can still be getting inflamed but no definitive neuroma. At this point we need to work on still getting the pressure off of the ball of the foot. We can do this with an orthotic. If you would like we can also get the MRI to take a closer look.

## 2019-08-01 NOTE — Telephone Encounter (Signed)
Left message asking pt if she had any questions concerning Dr. Leigh Aurora MyChart message if so call me.

## 2019-08-16 ENCOUNTER — Other Ambulatory Visit (INDEPENDENT_AMBULATORY_CARE_PROVIDER_SITE_OTHER): Payer: Self-pay | Admitting: Family Medicine

## 2019-08-16 DIAGNOSIS — E559 Vitamin D deficiency, unspecified: Secondary | ICD-10-CM

## 2019-08-18 ENCOUNTER — Ambulatory Visit (INDEPENDENT_AMBULATORY_CARE_PROVIDER_SITE_OTHER): Payer: BC Managed Care – PPO | Admitting: Family Medicine

## 2019-08-25 ENCOUNTER — Encounter (INDEPENDENT_AMBULATORY_CARE_PROVIDER_SITE_OTHER): Payer: Self-pay | Admitting: Family Medicine

## 2019-08-25 ENCOUNTER — Ambulatory Visit (INDEPENDENT_AMBULATORY_CARE_PROVIDER_SITE_OTHER): Payer: BC Managed Care – PPO | Admitting: Family Medicine

## 2019-08-25 ENCOUNTER — Other Ambulatory Visit: Payer: Self-pay

## 2019-08-25 VITALS — BP 138/83 | HR 69 | Temp 98.6°F | Ht 62.0 in | Wt 189.0 lb

## 2019-08-25 DIAGNOSIS — E669 Obesity, unspecified: Secondary | ICD-10-CM

## 2019-08-25 DIAGNOSIS — F3289 Other specified depressive episodes: Secondary | ICD-10-CM | POA: Diagnosis not present

## 2019-08-25 DIAGNOSIS — Z9189 Other specified personal risk factors, not elsewhere classified: Secondary | ICD-10-CM | POA: Diagnosis not present

## 2019-08-25 DIAGNOSIS — E559 Vitamin D deficiency, unspecified: Secondary | ICD-10-CM | POA: Diagnosis not present

## 2019-08-25 DIAGNOSIS — R7303 Prediabetes: Secondary | ICD-10-CM

## 2019-08-25 DIAGNOSIS — E7849 Other hyperlipidemia: Secondary | ICD-10-CM

## 2019-08-25 DIAGNOSIS — Z6834 Body mass index (BMI) 34.0-34.9, adult: Secondary | ICD-10-CM

## 2019-08-25 NOTE — Progress Notes (Signed)
Chief Complaint:   OBESITY Grace Horton is here to discuss her progress with her obesity treatment plan along with follow-up of her obesity related diagnoses. Grace Horton is on the Paleo diet with 90+ grams of protein and states she is following her eating plan approximately 70% of the time. Grace Horton states she is walking for 30 minutes 2 times per week.  Today's visit was #: 12 Starting weight: 219 lbs Starting date: 06/25/2016 Today's weight: 189 lbs Today's date: 08/29/2019 Total lbs lost to date: 30 lbs Total lbs lost since last in-office visit: 0  Interim History: Grace Horton says she does not journal or count calories, eats a self-directed Paleo diet 70% of the time.  She is not sure if she is eating too much or too little.  She says she ate more carbs this last month.   Last Ov was with Grace Horton over 6 wks ago. She has had foot issues, which make it difficult to move/exercise.  She says she also had fascial pain and only could eat soup for several days (homemade chicken soup, no noodles).  She says she ate bread and peanut butter often; not following paleo diet.  Subjective:   1. Vitamin D deficiency Grace Horton's Vitamin D level was 41.4 on 07/20/2019. She is currently taking prescription vitamin D 50,000 IU each week. She denies nausea, vomiting or muscle weakness.  She says she has not been taking her weekly dose.  She is only taking it once a month due to it being summertime.  2. Prediabetes Grace Horton has a diagnosis of prediabetes based on her elevated HgA1c and was informed this puts her at greater risk of developing diabetes. She continues to work on diet and exercise to decrease her risk of diabetes. She denies nausea or hypoglycemia.  Lab Results  Component Value Date   HGBA1C 5.2 07/20/2019   Lab Results  Component Value Date   INSULIN 8.0 07/20/2019   INSULIN 7.7 02/22/2019   INSULIN 13.9 09/28/2018   INSULIN 8.8 11/17/2017   INSULIN 6.5 06/11/2017   3. Other hyperlipidemia Grace Horton  has hyperlipidemia and has been trying to improve her cholesterol levels with intensive lifestyle modification including a low saturated fat diet, exercise and weight loss. She denies any chest pain, claudication or myalgias.  Lab Results  Component Value Date   ALT 10 07/20/2019   AST 13 07/20/2019   ALKPHOS 75 07/20/2019   BILITOT 0.4 07/20/2019   Lab Results  Component Value Date   CHOL 178 07/20/2019   HDL 48 07/20/2019   LDLCALC 120 (H) 07/20/2019   TRIG 51 07/20/2019   4. Other depression, with emotional eating  Grace Horton is struggling with emotional eating and using food for comfort to the extent that it is negatively impacting her health. She has been working on behavior modification techniques to help reduce her emotional eating and has been unsuccessful. She shows no sign of suicidal or homicidal ideations.  Grace Horton takes Wellbutrin and states she takes it for chronic fatigue.  5. At risk for diabetes mellitus Grace Horton is at higher than average risk for developing diabetes due to her obesity.   Assessment/Plan:   1. Vitamin D deficiency Low Vitamin D level contributes to fatigue and are associated with obesity, breast, and colon cancer. She agrees to change her prescription Vitamin D @50 ,000 IU to twice per month dosing and will follow-up for routine testing of Vitamin D, at least 2-3 times per year to avoid over-replacement.  2. Prediabetes Low Vitamin  D level contributes to fatigue and are associated with obesity, breast, and colon cancer. She agrees to continue to take prescription Vitamin D @50 ,000 IU every week and will follow-up for routine testing of Vitamin D, at least 2-3 times per year to avoid over-replacement.  3. Other hyperlipidemia Low Vitamin D level contributes to fatigue and are associated with obesity, breast, and colon cancer. She agrees to continue to take prescription Vitamin D @50 ,000 IU every week and will follow-up for routine testing of Vitamin D, at least  2-3 times per year to avoid over-replacement.  4. Other depression, with emotional eating  Low Vitamin D level contributes to fatigue and are associated with obesity, breast, and colon cancer. She agrees to continue to take prescription Vitamin D @50 ,000 IU every week and will follow-up for routine testing of Vitamin D, at least 2-3 times per year to avoid over-replacement. - buPROPion (WELLBUTRIN SR) 200 MG 12 hr tablet; Take 1 tablet (200 mg total) by mouth daily before breakfast.  Dispense: 30 tablet; Refill: 0  5. At risk for diabetes mellitus Grace Horton was given approximately 15 minutes of diabetes education and counseling today. We discussed intensive lifestyle modifications today with an emphasis on weight loss as well as increasing exercise and decreasing simple carbohydrates in her diet. We also reviewed medication options with an emphasis on risk versus benefit of those discussed.   Repetitive spaced learning was employed today to elicit superior memory formation and behavioral change.  6. Class 1 obesity with serious comorbidity and body mass index (BMI) of 34.0 to 34.9 in adult, unspecified obesity type - Insulin Pen Needle (BD PEN NEEDLE NANO U/F) 32G X 4 MM MISC; Inject 1 each into the skin daily.  Dispense: 100 each; Refill: 0 - Liraglutide -Weight Management (SAXENDA) 18 MG/3ML SOPN; Inject 0.5 mLs (3 mg total) into the skin daily.  Dispense: 5 pen; Refill: 0  Grace Horton is currently in the action stage of change. As such, her goal is to continue with weight loss efforts. She has agreed to the [employer] with 90+ grams of protein.  She needs to journal with [facility].   Exercise goals: As is.  Behavioral modification strategies: increasing lean protein intake, decreasing simple carbohydrates, increasing water intake and planning for success.  Grace Horton has agreed to follow-up with our clinic in 2 weeks. She was informed of the importance of frequent follow-up visits to maximize her success with  intensive lifestyle modifications for her multiple health conditions.   Objective:   Blood pressure 138/83, pulse 69, temperature 98.6 F (37 C), temperature source Oral, height 5\' 2"  (1.575 m), weight 189 lb (85.7 kg), SpO2 96 %. Body mass index is 34.57 kg/m.  General: Cooperative, alert, well developed, in no acute distress. HEENT: Conjunctivae and lids unremarkable. Cardiovascular: Regular rhythm.  Lungs: Normal work of breathing. Neurologic: No focal deficits.   Lab Results  Component Value Date   CREATININE 0.85 07/20/2019   BUN 16 07/20/2019   NA 141 07/20/2019   K 4.6 07/20/2019   CL 106 07/20/2019   CO2 22 07/20/2019   Lab Results  Component Value Date   ALT 10 07/20/2019   AST 13 07/20/2019   ALKPHOS 75 07/20/2019   BILITOT 0.4 07/20/2019   Lab Results  Component Value Date   HGBA1C 5.2 07/20/2019   HGBA1C 5.2 02/22/2019   HGBA1C 5.0 09/28/2018   HGBA1C 5.2 11/17/2017   HGBA1C 5.1 06/11/2017   Lab Results  Component Value Date   INSULIN  8.0 07/20/2019   INSULIN 7.7 02/22/2019   INSULIN 13.9 09/28/2018   INSULIN 8.8 11/17/2017   INSULIN 6.5 06/11/2017   Lab Results  Component Value Date   TSH 2.330 09/28/2018   Lab Results  Component Value Date   CHOL 178 07/20/2019   HDL 48 07/20/2019   LDLCALC 120 (H) 07/20/2019   TRIG 51 07/20/2019   Lab Results  Component Value Date   WBC 6.0 11/17/2017   HGB 12.8 11/17/2017   HCT 37.7 11/17/2017   MCV 92 11/17/2017   PLT 317 05/27/2012   Attestation Statements:   Reviewed by clinician on day of visit: allergies, medications, problem list, medical history, surgical history, family history, social history, and previous encounter notes.  I, Water quality scientist, CMA, am acting as Location manager for [employer], DO.  I have reviewed the above documentation for accuracy and completeness, and I agree with the above. Mellody Dance, DO

## 2019-08-28 MED ORDER — SAXENDA 18 MG/3ML ~~LOC~~ SOPN: 3.0000 mg | PEN_INJECTOR | Freq: Every day | SUBCUTANEOUS | 0 refills | Status: DC

## 2019-08-28 MED ORDER — BUPROPION HCL ER (SR) 200 MG PO TB12: 200.0000 mg | ORAL_TABLET | Freq: Every day | ORAL | 0 refills | Status: DC

## 2019-08-28 MED ORDER — BD PEN NEEDLE NANO U/F 32G X 4 MM MISC: 1.0000 | Freq: Every day | 0 refills | Status: DC

## 2019-09-06 ENCOUNTER — Other Ambulatory Visit: Payer: Self-pay

## 2019-09-06 ENCOUNTER — Ambulatory Visit (INDEPENDENT_AMBULATORY_CARE_PROVIDER_SITE_OTHER): Payer: BC Managed Care – PPO | Admitting: Family Medicine

## 2019-09-06 ENCOUNTER — Encounter (INDEPENDENT_AMBULATORY_CARE_PROVIDER_SITE_OTHER): Payer: Self-pay | Admitting: Family Medicine

## 2019-09-06 VITALS — BP 136/79 | HR 82 | Temp 98.5°F | Ht 62.0 in | Wt 185.0 lb

## 2019-09-06 DIAGNOSIS — Z6833 Body mass index (BMI) 33.0-33.9, adult: Secondary | ICD-10-CM | POA: Diagnosis not present

## 2019-09-06 DIAGNOSIS — I10 Essential (primary) hypertension: Secondary | ICD-10-CM

## 2019-09-06 DIAGNOSIS — E669 Obesity, unspecified: Secondary | ICD-10-CM | POA: Diagnosis not present

## 2019-09-07 NOTE — Progress Notes (Signed)
Chief Complaint:   OBESITY Grace Horton is here to discuss her progress with her obesity treatment plan along with follow-up of her obesity related diagnoses. Grace Horton is on following a lower carbohydrate, vegetable and lean protein rich diet plan and Paleo with 90+ grams of protein and states she is following her eating plan approximately 98% of the time. Grace Horton states she is is on the recumbent bike and walking for 30 minutes 2 times per week.  Today's visit was #: 47 Starting weight: 219 lbs Starting date: 06/25/2016 Today's weight: 185 lbs Today's date: 09/06/2019 Total lbs lost to date: 34 Total lbs lost since last in-office visit: 4  Interim History: Grace Horton continues to do well with weight loss. She increased Saxenda to 1.8 mg and her hunger is well controlled. She had been on vacation and had gained a bit of weight but she has been back on track.  Subjective:   1. Essential hypertension Grace Horton's blood pressure is stable. She is working on diet, exercise, and weight loss. She denies chest pain or lightheadedness.  Assessment/Plan:   1. Essential hypertension Grace Horton will continue working on healthy weight loss, diet, and exercise to improve blood pressure control. We will watch for signs of hypotension as she continues her lifestyle modifications. We will recheck her blood pressure in 1 month.  2. Class 1 obesity with serious comorbidity and body mass index (BMI) of 33.0 to 33.9 in adult, unspecified obesity type Grace Horton is currently in the action stage of change. As such, her goal is to continue with weight loss efforts. She has agreed to following a lower carbohydrate, vegetable and lean protein rich diet plan/Paleo diet.   Exercise goals: As is.  Behavioral modification strategies: increasing lean protein intake.  Grace Horton has agreed to follow-up with our clinic in 3 to 4 weeks. She was informed of the importance of frequent follow-up visits to maximize her success with intensive  lifestyle modifications for her multiple health conditions.   Objective:   Blood pressure (!) 136/79, pulse 82, temperature 98.5 F (36.9 C), temperature source Oral, height 5\' 2"  (1.575 m), weight 185 lb (83.9 kg), SpO2 98 %. Body mass index is 33.84 kg/m.  General: Cooperative, alert, well developed, in no acute distress. HEENT: Conjunctivae and lids unremarkable. Cardiovascular: Regular rhythm.  Lungs: Normal work of breathing. Neurologic: No focal deficits.   Lab Results  Component Value Date   CREATININE 0.85 07/20/2019   BUN 16 07/20/2019   NA 141 07/20/2019   K 4.6 07/20/2019   CL 106 07/20/2019   CO2 22 07/20/2019   Lab Results  Component Value Date   ALT 10 07/20/2019   AST 13 07/20/2019   ALKPHOS 75 07/20/2019   BILITOT 0.4 07/20/2019   Lab Results  Component Value Date   HGBA1C 5.2 07/20/2019   HGBA1C 5.2 02/22/2019   HGBA1C 5.0 09/28/2018   HGBA1C 5.2 11/17/2017   HGBA1C 5.1 06/11/2017   Lab Results  Component Value Date   INSULIN 8.0 07/20/2019   INSULIN 7.7 02/22/2019   INSULIN 13.9 09/28/2018   INSULIN 8.8 11/17/2017   INSULIN 6.5 06/11/2017   Lab Results  Component Value Date   TSH 2.330 09/28/2018   Lab Results  Component Value Date   CHOL 178 07/20/2019   HDL 48 07/20/2019   LDLCALC 120 (H) 07/20/2019   TRIG 51 07/20/2019   Lab Results  Component Value Date   WBC 6.0 11/17/2017   HGB 12.8 11/17/2017   HCT 37.7  11/17/2017   MCV 92 11/17/2017   PLT 317 05/27/2012   No results found for: IRON, TIBC, FERRITIN  Attestation Statements:   Reviewed by clinician on day of visit: allergies, medications, problem list, medical history, surgical history, family history, social history, and previous encounter notes.  Time spent on visit including pre-visit chart review and post-visit care and charting was 21 minutes.    I, Trixie Dredge, am acting as transcriptionist for Dennard Nip, MD.  I have reviewed the above documentation for  accuracy and completeness, and I agree with the above. -  Dennard Nip, MD

## 2019-10-04 ENCOUNTER — Other Ambulatory Visit: Payer: Self-pay

## 2019-10-04 ENCOUNTER — Encounter (INDEPENDENT_AMBULATORY_CARE_PROVIDER_SITE_OTHER): Payer: Self-pay | Admitting: Family Medicine

## 2019-10-04 ENCOUNTER — Ambulatory Visit (INDEPENDENT_AMBULATORY_CARE_PROVIDER_SITE_OTHER): Payer: BC Managed Care – PPO | Admitting: Family Medicine

## 2019-10-04 VITALS — BP 134/83 | HR 77 | Temp 98.3°F | Ht 62.0 in | Wt 186.0 lb

## 2019-10-04 DIAGNOSIS — Z6834 Body mass index (BMI) 34.0-34.9, adult: Secondary | ICD-10-CM

## 2019-10-04 DIAGNOSIS — F3289 Other specified depressive episodes: Secondary | ICD-10-CM

## 2019-10-04 DIAGNOSIS — E669 Obesity, unspecified: Secondary | ICD-10-CM

## 2019-10-04 MED ORDER — SAXENDA 18 MG/3ML ~~LOC~~ SOPN: 3.0000 mg | PEN_INJECTOR | Freq: Every day | SUBCUTANEOUS | 0 refills | Status: DC

## 2019-10-04 MED ORDER — BUPROPION HCL ER (SR) 200 MG PO TB12: 200.0000 mg | ORAL_TABLET | Freq: Every day | ORAL | 0 refills | Status: DC

## 2019-10-04 NOTE — Progress Notes (Signed)
Chief Complaint:   OBESITY Grace Horton is here to discuss her progress with her obesity treatment plan along with follow-up of her obesity related diagnoses. Grace Horton is on following a lower carbohydrate, vegetable and lean protein rich diet plan and states she is following her eating plan approximately 70% of the time. Grace Horton states she is doing 0 minutes 0 times per week.  Today's visit was #: 54 Starting weight: 219 lbs Starting date: 06/25/2016 Today's weight: 186 lbs Today's date: 10/04/2019 Total lbs lost to date: 33 Total lbs lost since last in-office visit: 0  Interim History: Grace Horton has increased traveling more lately, and she is not on her routine as much which has made following the Lower carbohydrate plan more difficult.  Subjective:   1. Other depression, with emotional eating  Grace Horton is stable on her medications, and she has had increased stress with family but she is doing well avoiding emotional eating.  Assessment/Plan:   1. Other depression, with emotional eating  Behavior modification techniques were discussed today to help Grace Horton deal with her emotional/non-hunger eating behaviors. We will refill Wellbutrin SR for 1 month. Orders and follow up as documented in patient record.   - buPROPion (WELLBUTRIN SR) 200 MG 12 hr tablet; Take 1 tablet (200 mg total) by mouth daily before breakfast.  Dispense: 30 tablet; Refill: 0  2. Class 1 obesity with serious comorbidity and body mass index (BMI) of 34.0 to 34.9 in adult, unspecified obesity type Grace Horton is currently in the action stage of change. As such, her goal is to continue with weight loss efforts. She has agreed to keeping a food journal and adhering to recommended goals of 1200-1500 calories and 85+ grams of protein daily.   We discussed various medication options to help Grace Horton with her weight loss efforts and we both agreed to continue Saxenda at 3 mg SubQ daily and we will refill for 1 month (she is to continue at  1.8 mg for now).  - Liraglutide -Weight Management (SAXENDA) 18 MG/3ML SOPN; Inject 0.5 mLs (3 mg total) into the skin daily.  Dispense: 3 mL; Refill: 0  Behavioral modification strategies: increasing lean protein intake and keeping a strict food journal.  Grace Horton has agreed to follow-up with our clinic in 4 weeks. She was informed of the importance of frequent follow-up visits to maximize her success with intensive lifestyle modifications for her multiple health conditions.   Objective:   Blood pressure 134/83, pulse 77, temperature 98.3 F (36.8 C), temperature source Oral, height 5\' 2"  (1.575 m), weight 186 lb (84.4 kg), SpO2 96 %. Body mass index is 34.02 kg/m.  General: Cooperative, alert, well developed, in no acute distress. HEENT: Conjunctivae and lids unremarkable. Cardiovascular: Regular rhythm.  Lungs: Normal work of breathing. Neurologic: No focal deficits.   Lab Results  Component Value Date   CREATININE 0.85 07/20/2019   BUN 16 07/20/2019   NA 141 07/20/2019   K 4.6 07/20/2019   CL 106 07/20/2019   CO2 22 07/20/2019   Lab Results  Component Value Date   ALT 10 07/20/2019   AST 13 07/20/2019   ALKPHOS 75 07/20/2019   BILITOT 0.4 07/20/2019   Lab Results  Component Value Date   HGBA1C 5.2 07/20/2019   HGBA1C 5.2 02/22/2019   HGBA1C 5.0 09/28/2018   HGBA1C 5.2 11/17/2017   HGBA1C 5.1 06/11/2017   Lab Results  Component Value Date   INSULIN 8.0 07/20/2019   INSULIN 7.7 02/22/2019   INSULIN 13.9  09/28/2018   INSULIN 8.8 11/17/2017   INSULIN 6.5 06/11/2017   Lab Results  Component Value Date   TSH 2.330 09/28/2018   Lab Results  Component Value Date   CHOL 178 07/20/2019   HDL 48 07/20/2019   LDLCALC 120 (H) 07/20/2019   TRIG 51 07/20/2019   Lab Results  Component Value Date   WBC 6.0 11/17/2017   HGB 12.8 11/17/2017   HCT 37.7 11/17/2017   MCV 92 11/17/2017   PLT 317 05/27/2012   No results found for: IRON, TIBC, FERRITIN  Attestation  Statements:   Reviewed by clinician on day of visit: allergies, medications, problem list, medical history, surgical history, family history, social history, and previous encounter notes.  Time spent on visit including pre-visit chart review and post-visit care and charting was 32 minutes.    I, Trixie Dredge, am acting as transcriptionist for Dennard Nip, MD.  I have reviewed the above documentation for accuracy and completeness, and I agree with the above. -  Dennard Nip, MD

## 2019-11-01 ENCOUNTER — Encounter (INDEPENDENT_AMBULATORY_CARE_PROVIDER_SITE_OTHER): Payer: Self-pay | Admitting: Family Medicine

## 2019-11-01 ENCOUNTER — Ambulatory Visit (INDEPENDENT_AMBULATORY_CARE_PROVIDER_SITE_OTHER): Payer: BC Managed Care – PPO | Admitting: Family Medicine

## 2019-11-01 ENCOUNTER — Other Ambulatory Visit: Payer: Self-pay

## 2019-11-01 VITALS — BP 131/89 | HR 75 | Temp 98.3°F | Ht 62.0 in | Wt 186.0 lb

## 2019-11-01 DIAGNOSIS — J3089 Other allergic rhinitis: Secondary | ICD-10-CM

## 2019-11-01 DIAGNOSIS — E669 Obesity, unspecified: Secondary | ICD-10-CM

## 2019-11-01 DIAGNOSIS — Z9189 Other specified personal risk factors, not elsewhere classified: Secondary | ICD-10-CM | POA: Diagnosis not present

## 2019-11-01 DIAGNOSIS — E559 Vitamin D deficiency, unspecified: Secondary | ICD-10-CM

## 2019-11-01 DIAGNOSIS — F3289 Other specified depressive episodes: Secondary | ICD-10-CM

## 2019-11-01 DIAGNOSIS — Z6834 Body mass index (BMI) 34.0-34.9, adult: Secondary | ICD-10-CM

## 2019-11-01 MED ORDER — SAXENDA 18 MG/3ML ~~LOC~~ SOPN: 3.0000 mg | PEN_INJECTOR | Freq: Every day | SUBCUTANEOUS | 0 refills | Status: DC

## 2019-11-01 MED ORDER — BUPROPION HCL ER (SR) 150 MG PO TB12: 150.0000 mg | ORAL_TABLET | Freq: Two times a day (BID) | ORAL | 0 refills | Status: DC

## 2019-11-01 MED ORDER — FLUTICASONE PROPIONATE 50 MCG/ACT NA SUSP: NASAL | 0 refills | Status: AC

## 2019-11-01 MED ORDER — VITAMIN D (ERGOCALCIFEROL) 1.25 MG (50000 UNIT) PO CAPS: ORAL_CAPSULE | ORAL | 0 refills | Status: AC

## 2019-11-01 MED ORDER — BD PEN NEEDLE NANO U/F 32G X 4 MM MISC: 1.0000 | Freq: Every day | 0 refills | Status: AC

## 2019-11-02 NOTE — Progress Notes (Signed)
Chief Complaint:   OBESITY Grace Horton is here to discuss her progress with her obesity treatment plan along with follow-up of her obesity related diagnoses. [name] is on keeping a food journal and adhering to recommended goals of 1200-1500 calories and 85+ grams of protein daily and states she is following her eating plan approximately 75% of the time. [name] states she is doing 0 minutes 0 times per week.  Today's visit was #: 73 Starting weight: 219 lbs Starting date: 06/25/2016 Today's weight: 186 lbs Today's date: 11/01/2019 Total lbs lost to date: 33 Total lbs lost since last in-office visit: 0  Interim History: Grace Horton has done well with maintaining her weight but she notes increased fatigue and decreased activity in the last month. She is tolerating Saxenda well and would like to discuss low carbohydrate prep breakfast options.  Subjective:   1. Vitamin D deficiency [name] is stable on Vit D, and she denies nausea or vomiting.  2. Other allergic rhinitis [name] notes increased nasal congestion and puffy eyes and fatigue in the last month. She requests a refill of her Flonase.  3. Other depression, with emotional eating  Grace Horton notes increased fatigue and feeling like she is dragging in the last month. She has done well on Wellbutrin but feels it isn't working as well lately.  4. At risk for impaired metabolic function Grace Horton is at increased risk for impaired metabolic function due to calories dropping too low at times.  Assessment/Plan:   1. Vitamin D deficiency Low Vitamin D level contributes to fatigue and are associated with obesity, breast, and colon cancer. We will refill prescription Vitamin D for 1 month. [name] will follow-up for routine testing of Vitamin D, at least 2-3 times per year to avoid over-replacement.  - Vitamin D, Ergocalciferol, (DRISDOL) 1.25 MG (50000 UNIT) CAPS capsule; TAKE 1 CAPSULE BY MOUTH weekly  Dispense: 4 capsule; Refill: 0  2. Other  allergic rhinitis We will refill Flonase for 1 month, and [name] will continue to follow up as directed.  - fluticasone (FLONASE) 50 MCG/ACT nasal spray; 2 sprays in each nostril daily  Dispense: 16 g; Refill: 0  3. Other depression, with emotional eating  Behavior modification techniques were discussed today to help [name] deal with her emotional/non-hunger eating behaviors. Perl agreed to increase Wellbutrin SR to 150 mg BID with no refills. Orders and follow up as documented in patient record.   - buPROPion (WELLBUTRIN SR) 150 MG 12 hr tablet; Take 1 tablet (150 mg total) by mouth 2 (two) times daily.  Dispense: 60 tablet; Refill: 0  4. At risk for impaired metabolic function [name] was given approximately 15 minutes of impaired  metabolic function prevention counseling today. We discussed intensive lifestyle modifications today with an emphasis on specific nutrition and exercise instructions and strategies.   Repetitive spaced learning was employed today to elicit superior memory formation and behavioral change.  5. Class 1 obesity with serious comorbidity and body mass index (BMI) of 34.0 to 34.9 in adult, unspecified obesity type Grace Horton is currently in the action stage of change. As such, her goal is to continue with weight loss efforts. She has agreed to keeping a food journal and adhering to recommended goals of 1200 calories and 75+ grams of protein daily.   Breakfast options were discussed.  We discussed various medication options to help Grace Horton with her weight loss efforts and we both agreed to continue Saxenda, and we will refill for 1 month, and refill pen needles #  100 with no refill.  - Liraglutide -Weight Management (SAXENDA) 18 MG/3ML SOPN; Inject 3 mg into the skin daily.  Dispense: 3 mL; Refill: 0 - Insulin Pen Needle (BD PEN NEEDLE NANO U/F) 32G X 4 MM MISC; Inject 1 each into the skin daily.  Dispense: 100 each; Refill: 0  Behavioral modification strategies: no skipping  meals and meal planning and cooking strategies.  [name] has agreed to follow-up with our clinic in 4 weeks. She was informed of the importance of frequent follow-up visits to maximize her success with intensive lifestyle modifications for her multiple health conditions.   Objective:   Blood pressure 131/89, pulse 75, temperature 98.3 F (36.8 C), height 5\' 2"  (1.575 m), weight 186 lb (84.4 kg), SpO2 99 %. Body mass index is 34.02 kg/m.  General: Cooperative, alert, well developed, in no acute distress. HEENT: Conjunctivae and lids unremarkable. Cardiovascular: Regular rhythm.  Lungs: Normal work of breathing. Neurologic: No focal deficits.   Lab Results  Component Value Date   CREATININE 0.85 07/20/2019   BUN 16 07/20/2019   NA 141 07/20/2019   K 4.6 07/20/2019   CL 106 07/20/2019   CO2 22 07/20/2019   Lab Results  Component Value Date   ALT 10 07/20/2019   AST 13 07/20/2019   ALKPHOS 75 07/20/2019   BILITOT 0.4 07/20/2019   Lab Results  Component Value Date   HGBA1C 5.2 07/20/2019   HGBA1C 5.2 02/22/2019   HGBA1C 5.0 09/28/2018   HGBA1C 5.2 11/17/2017   HGBA1C 5.1 06/11/2017   Lab Results  Component Value Date   INSULIN 8.0 07/20/2019   INSULIN 7.7 02/22/2019   INSULIN 13.9 09/28/2018   INSULIN 8.8 11/17/2017   INSULIN 6.5 06/11/2017   Lab Results  Component Value Date   TSH 2.330 09/28/2018   Lab Results  Component Value Date   CHOL 178 07/20/2019   HDL 48 07/20/2019   LDLCALC 120 (H) 07/20/2019   TRIG 51 07/20/2019   Lab Results  Component Value Date   WBC 6.0 11/17/2017   HGB 12.8 11/17/2017   HCT 37.7 11/17/2017   MCV 92 11/17/2017   PLT 317 05/27/2012   No results found for: IRON, TIBC, FERRITIN  Attestation Statements:   Reviewed by clinician on day of visit: allergies, medications, problem list, medical history, surgical history, family history, social history, and previous encounter notes.   I, Trixie Dredge, am acting as  transcriptionist for Dennard Nip, MD.  I have reviewed the above documentation for accuracy and completeness, and I agree with the above. -  Dennard Nip, MD

## 2019-11-29 ENCOUNTER — Ambulatory Visit (INDEPENDENT_AMBULATORY_CARE_PROVIDER_SITE_OTHER): Payer: BC Managed Care – PPO | Admitting: Family Medicine

## 2019-12-02 ENCOUNTER — Other Ambulatory Visit (INDEPENDENT_AMBULATORY_CARE_PROVIDER_SITE_OTHER): Payer: Self-pay | Admitting: Family Medicine

## 2019-12-02 DIAGNOSIS — E559 Vitamin D deficiency, unspecified: Secondary | ICD-10-CM

## 2019-12-04 ENCOUNTER — Other Ambulatory Visit (INDEPENDENT_AMBULATORY_CARE_PROVIDER_SITE_OTHER): Payer: Self-pay | Admitting: Family Medicine

## 2019-12-04 DIAGNOSIS — J3089 Other allergic rhinitis: Secondary | ICD-10-CM

## 2019-12-04 DIAGNOSIS — F3289 Other specified depressive episodes: Secondary | ICD-10-CM

## 2019-12-05 ENCOUNTER — Encounter (INDEPENDENT_AMBULATORY_CARE_PROVIDER_SITE_OTHER): Payer: Self-pay

## 2019-12-05 NOTE — Telephone Encounter (Signed)
MyChart message sent to pt

## 2019-12-06 NOTE — Telephone Encounter (Signed)
Mychart message sent to pt to find out if she is need of this before next appt.

## 2019-12-06 NOTE — Telephone Encounter (Signed)
Pt indicated via mychart message that she is not in need of a refill at this time.

## 2019-12-08 ENCOUNTER — Ambulatory Visit (INDEPENDENT_AMBULATORY_CARE_PROVIDER_SITE_OTHER): Payer: BC Managed Care – PPO | Admitting: Family Medicine

## 2019-12-12 ENCOUNTER — Telehealth (INDEPENDENT_AMBULATORY_CARE_PROVIDER_SITE_OTHER): Payer: BC Managed Care – PPO | Admitting: Family Medicine

## 2019-12-12 ENCOUNTER — Encounter (INDEPENDENT_AMBULATORY_CARE_PROVIDER_SITE_OTHER): Payer: Self-pay | Admitting: Family Medicine

## 2019-12-12 ENCOUNTER — Other Ambulatory Visit: Payer: Self-pay

## 2019-12-12 VITALS — BP 164/105 | Ht 62.0 in | Wt 191.0 lb

## 2019-12-12 DIAGNOSIS — F3289 Other specified depressive episodes: Secondary | ICD-10-CM | POA: Diagnosis not present

## 2019-12-12 DIAGNOSIS — E559 Vitamin D deficiency, unspecified: Secondary | ICD-10-CM | POA: Diagnosis not present

## 2019-12-12 DIAGNOSIS — E669 Obesity, unspecified: Secondary | ICD-10-CM

## 2019-12-12 DIAGNOSIS — Z6834 Body mass index (BMI) 34.0-34.9, adult: Secondary | ICD-10-CM | POA: Diagnosis not present

## 2019-12-12 MED ORDER — SAXENDA 18 MG/3ML ~~LOC~~ SOPN: 3.0000 mg | PEN_INJECTOR | Freq: Every day | SUBCUTANEOUS | 0 refills | Status: AC

## 2019-12-12 MED ORDER — BUPROPION HCL ER (SR) 200 MG PO TB12: 200.0000 mg | ORAL_TABLET | Freq: Every day | ORAL | 0 refills | Status: DC

## 2019-12-13 ENCOUNTER — Encounter (INDEPENDENT_AMBULATORY_CARE_PROVIDER_SITE_OTHER): Payer: Self-pay | Admitting: Family Medicine

## 2019-12-13 NOTE — Progress Notes (Signed)
TeleHealth Visit:  Due to the COVID-19 pandemic, this visit was completed with telemedicine (audio/video) technology to reduce patient and provider exposure as well as to preserve personal protective equipment.   Grace Horton has verbally consented to this TeleHealth visit. The patient is located at home, the provider is located at the [facility] office. The participants in this visit include the listed provider and patient. The visit was conducted today via MyChart video.   Chief Complaint: OBESITY Grace Horton is here to discuss her progress with her obesity treatment plan along with follow-up of her obesity related diagnoses. Grace Horton is on keeping a food journal and adhering to recommended goals of 1200 calories and 75+ grams of protein daily and states she is following her eating plan approximately 50% of the time. Grace Horton states she is doing 0 minutes 0 times per week.  Today's visit was #: 26 Starting weight: 219 lbs Starting date: 06/25/2016  Interim History: Grace Horton reports she has gained 2 lbs. She weighed 191 lbs today at home. Weight has been plateaued since July 2021. She has been caring for her mother who had total knee replacement surgery, and her dad who has failing health. She says she is caring for everyone else but not doing a good job with self care.  She is taking Saxenda 1.8 mg daily.  She has occasional constipation when she does not get in enough fiber and water. She has had a recent episode of reflux which she reports does not occur when she adheres well to the plan. She says she has been eating too many carbohydrates and has been meal planning and cooking less.   Subjective:   1. Vitamin D deficiency Grace Horton's Vit D level is slightly low at 41.4. She is on weekly prescription Vit D, and she notes fatigue.  2. Other depression, with emotional eating Grace Horton's dose of bupropion was increased to 150 mg BID at her last office visit. She forgets the last dose more  often than not. She would like to get back to 200 mg in AM. She feels bupropion energizes her. Her blood pressure is 164/105 at home today. BP Readings from Last 3 Encounters:  12/12/19 (!) 164/105  11/01/19 131/89  10/04/19 134/83     Assessment/Plan:   1. Vitamin D deficiency  Grace Horton agreed to continue taking prescription Vitamin D 50,000 IU every week and will follow-up for routine testing of Vitamin D, at least 2-3 times per year to avoid over-replacement.  2. Other depression, with emotional eating  We will refill bupropion at 200 mg q AM for 1 month. We will recheck her blood pressure at her next office visit. She has a diagnosis of hypertension but is not taking anti-hypertensive medication. - buPROPion (WELLBUTRIN SR) 200 MG 12 hr tablet; Take 1 tablet (200 mg total) by mouth daily with breakfast.  Dispense: 30 tablet; Refill: 0  3. Class 1 obesity with serious comorbidity and body mass index (BMI) of 34.0 to 34.9 in adult, unspecified obesity type Grace Horton is currently in the action stage of change. As such, her goal is to continue with weight loss efforts. She has agreed to keeping a food journal and adhering to recommended goals of 1200 calories and 75+ grams of protein daily, and watching carbohydrates.   We discussed various medication options to help Grace Horton with her weight loss efforts and we both agreed to increase Saxenda to 3.0 mg SubQ daily, and we will refill for 1 month.  - Liraglutide -Weight  Management (SAXENDA) 18 MG/3ML SOPN; Inject 3 mg into the skin daily.  Dispense: 3 mL; Refill: 0  Exercise goals: No exercise has been prescribed at this time.  Behavioral modification strategies: decreasing simple carbohydrates and keeping a strict food journal.  Grace Horton has agreed to follow-up with our clinic in 3 weeks.   Objective:   VITALS: Per patient if applicable, see vitals. GENERAL: Alert and in no acute distress. CARDIOPULMONARY: No increased WOB. Speaking in clear  sentences.  PSYCH: Pleasant and cooperative. Speech normal rate and rhythm. Affect is appropriate. Insight and judgement are appropriate. Attention is focused, linear, and appropriate.  NEURO: Oriented as arrived to appointment on time with no prompting.   Lab Results  Component Value Date   CREATININE 0.85 07/20/2019   BUN 16 07/20/2019   NA 141 07/20/2019   K 4.6 07/20/2019   CL 106 07/20/2019   CO2 22 07/20/2019   Lab Results  Component Value Date   ALT 10 07/20/2019   AST 13 07/20/2019   ALKPHOS 75 07/20/2019   BILITOT 0.4 07/20/2019   Lab Results  Component Value Date   HGBA1C 5.2 07/20/2019   HGBA1C 5.2 02/22/2019   HGBA1C 5.0 09/28/2018   HGBA1C 5.2 11/17/2017   HGBA1C 5.1 06/11/2017   Lab Results  Component Value Date   INSULIN 8.0 07/20/2019   INSULIN 7.7 02/22/2019   INSULIN 13.9 09/28/2018   INSULIN 8.8 11/17/2017   INSULIN 6.5 06/11/2017   Lab Results  Component Value Date   TSH 2.330 09/28/2018   Lab Results  Component Value Date   CHOL 178 07/20/2019   HDL 48 07/20/2019   LDLCALC 120 (H) 07/20/2019   TRIG 51 07/20/2019   Lab Results  Component Value Date   WBC 6.0 11/17/2017   HGB 12.8 11/17/2017   HCT 37.7 11/17/2017   MCV 92 11/17/2017   PLT 317 05/27/2012   No results found for: IRON, TIBC, FERRITIN  Attestation Statements:   Reviewed by clinician on day of visit: allergies, medications, problem list, medical history, surgical history, family history, social history, and previous encounter notes.   Wilhemena Durie, am acting as Location manager for [employer], FNP-C.  I have reviewed the above documentation for accuracy and completeness, and I agree with the above. - Georgianne Fick, FNP

## 2020-02-14 ENCOUNTER — Telehealth (INDEPENDENT_AMBULATORY_CARE_PROVIDER_SITE_OTHER): Payer: Self-pay

## 2020-02-14 NOTE — Telephone Encounter (Signed)
PA for Bernie Covey has been initiated via CoverMyMeds.Tameika Heckmann [employer] (Key: [id number]) Saxenda 18MG pen-injectors   Form Ronny Bacon PA Form (2017 NCPDP) Created 3 minutes ago Sent to Plan 2 minutes ago Plan Response 2 minutes ago Submit Clinical Questions less than a minute ago Determination Wait for Determination Please wait for [employer] NCPDP 2017 to return a determination.

## 2020-02-15 NOTE — Telephone Encounter (Signed)
PA for Bernie Covey has been approved from 02/14/20 through 02/13/2021, as long as pt remains covered by [facility] and no changes are make to plan benefits.

## 2020-03-26 ENCOUNTER — Ambulatory Visit: Payer: BC Managed Care – PPO | Admitting: Podiatry

## 2020-03-26 ENCOUNTER — Other Ambulatory Visit: Payer: Self-pay

## 2020-03-26 DIAGNOSIS — M79671 Pain in right foot: Secondary | ICD-10-CM

## 2020-03-26 DIAGNOSIS — D3610 Benign neoplasm of peripheral nerves and autonomic nervous system, unspecified: Secondary | ICD-10-CM | POA: Diagnosis not present

## 2020-03-26 DIAGNOSIS — G8929 Other chronic pain: Secondary | ICD-10-CM

## 2020-03-30 NOTE — Progress Notes (Signed)
Subjective: 50 year old female presents the office today for concerns of recurrent pain to right foot, neuroma.  She is doing a lot better for some time but started to flareup a few weeks ago.  She denies any recent injury or trauma.  No swelling.  No other concerns. Denies any systemic complaints such as fevers, chills, nausea, vomiting. No acute changes since last appointment, and no other complaints at this time.   Objective: AAO x3, NAD DP/PT pulses palpable bilaterally, CRT less than 3 seconds There is tenderness palpation of the third interspace of the right foot and small palpable neuroma is identified  No edema, erythema.  No area pinpoint tenderness.  MMT 5/5 No pain with calf compression, swelling, warmth, erythema  Assessment: Right foot neuroma  Plan: -All treatment options discussed with the patient including all alternatives, risks, complications.  -Discussed both conservative as well surgical options.  Also proceed with conservative treatment.  Today it 1 cc of dehydrated sclerosing alcohol injection was infiltrated the third interspace on the area tenderness no complications.  Postinjection care discussed.  Dispensed neuroma offloading pads. -Patient encouraged to call the office with any questions, concerns, change in symptoms.   Trula Slade DPM

## 2020-04-17 ENCOUNTER — Other Ambulatory Visit: Payer: Self-pay

## 2020-04-17 ENCOUNTER — Ambulatory Visit: Payer: BC Managed Care – PPO | Admitting: Podiatry

## 2020-04-17 DIAGNOSIS — D3610 Benign neoplasm of peripheral nerves and autonomic nervous system, unspecified: Secondary | ICD-10-CM | POA: Diagnosis not present

## 2020-04-22 NOTE — Progress Notes (Signed)
Subjective: 50 year old female presents the office for her second dehydrated sclerosing alcohol injection for neuroma in the right foot.  She states that overall she is doing about the same without any significant change.  She had no complications after the first injection other than being sore for couple of days. Denies any systemic complaints such as fevers, chills, nausea, vomiting. No acute changes since last appointment, and no other complaints at this time.   Objective: AAO x3, NAD DP/PT pulses palpable bilaterally, CRT less than 3 seconds There is tenderness palpation still in the third interspace and small palpable neuroma is identified.  There is no edema, erythema.  She also describes discomfort sometimes the lateral aspect of her foot which I think is likely from compensation.  There is no area pinpoint tenderness no other areas of discomfort identified today.  No pain with calf compression, swelling, warmth, erythema  Assessment: 50 year old female neuroma right foot  Plan: -All treatment options discussed with the patient including all alternatives, risks, complications.  -Second dehydrated sclerosing injection was performed today.  Demonstrated 1.1 cc dehydrated sclerosing alcohol sent for treated into the third interspace without complications.  Postinjection care discussed.  Tolerated well. -Neuroma pad was dispensed.  She states these of that helpful. -Patient encouraged to call the office with any questions, concerns, change in symptoms.   Trula Slade DPM

## 2020-05-01 IMAGING — DX THORACIC SPINE - 3 VIEWS
3 series · 4 of 4 positions shown · non-contrast
Comparison: None.

CLINICAL DATA: Mid back pain for the past year, mainly with
movement. No known injury.

EXAM:
THORACIC SPINE - 3 VIEWS

[t-spine ap]
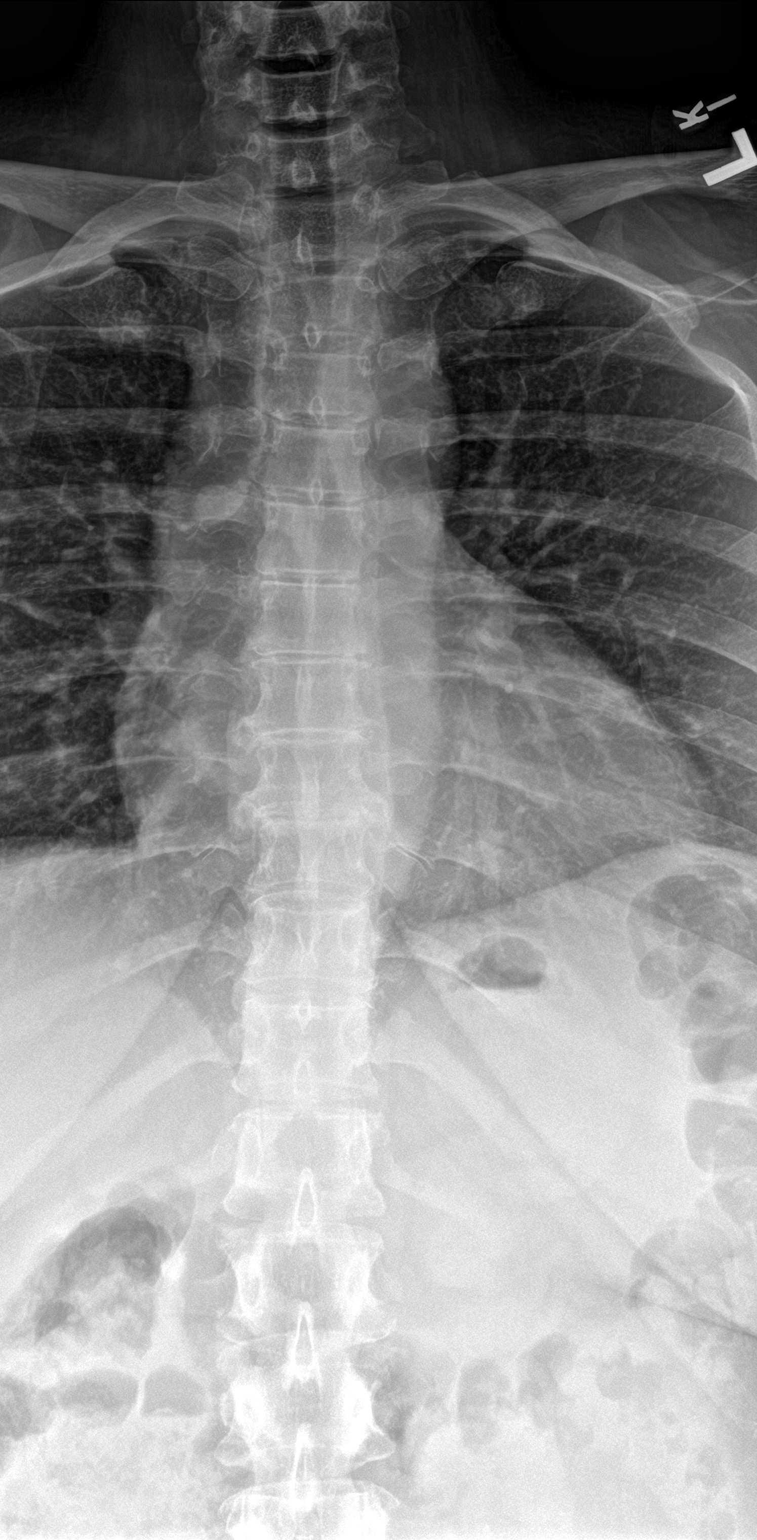

[Series 2: t-spine lat · 0.14mm/px · 2 of 2 slices shown]
[im 1/2]
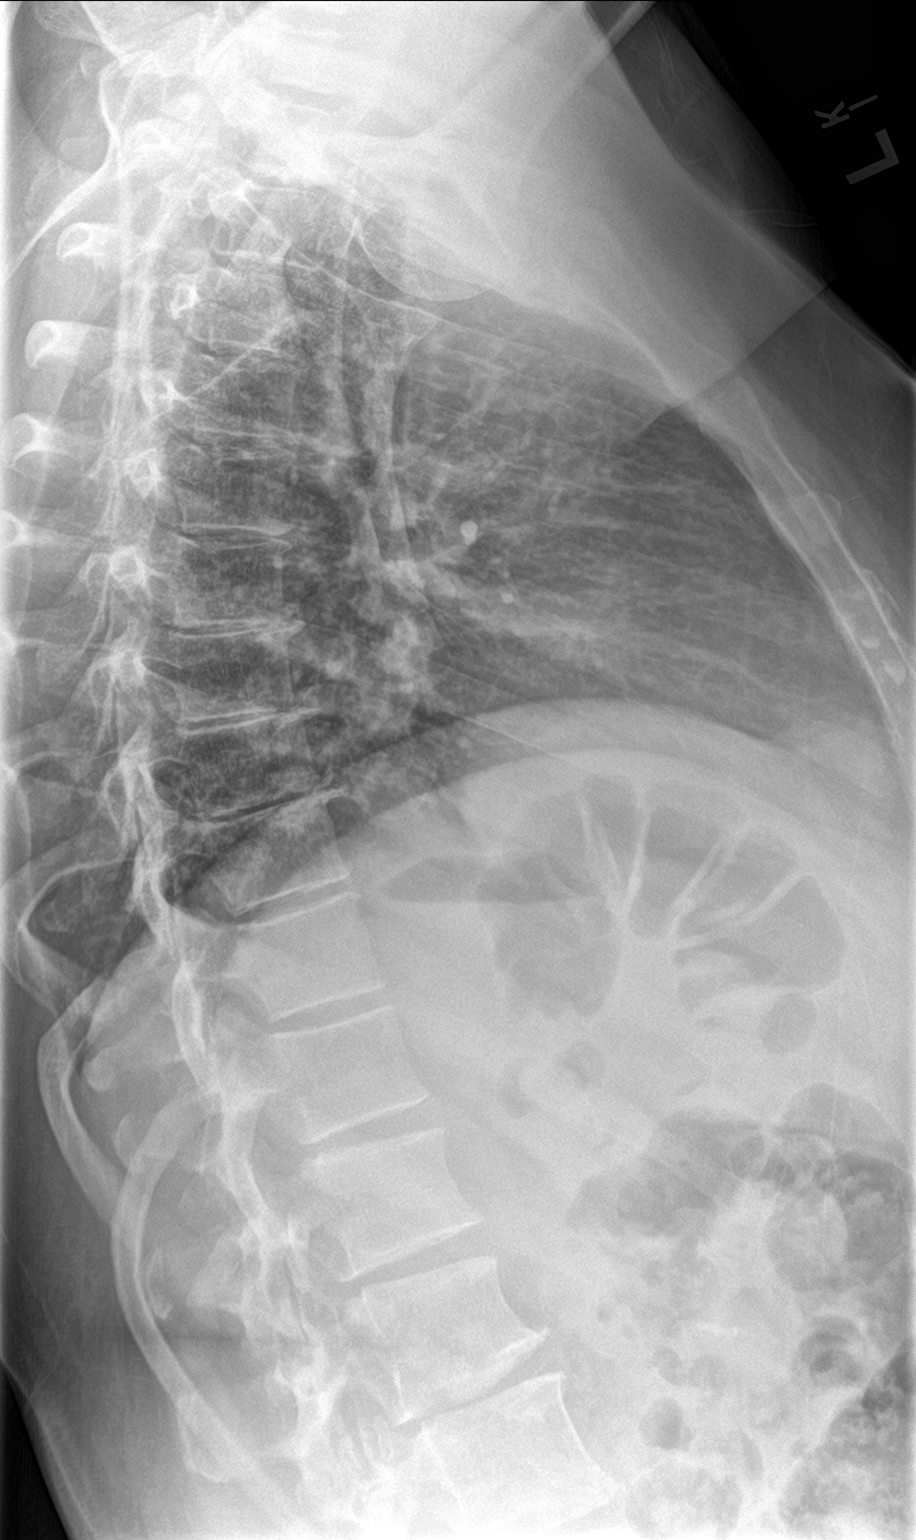
[im 2/2]
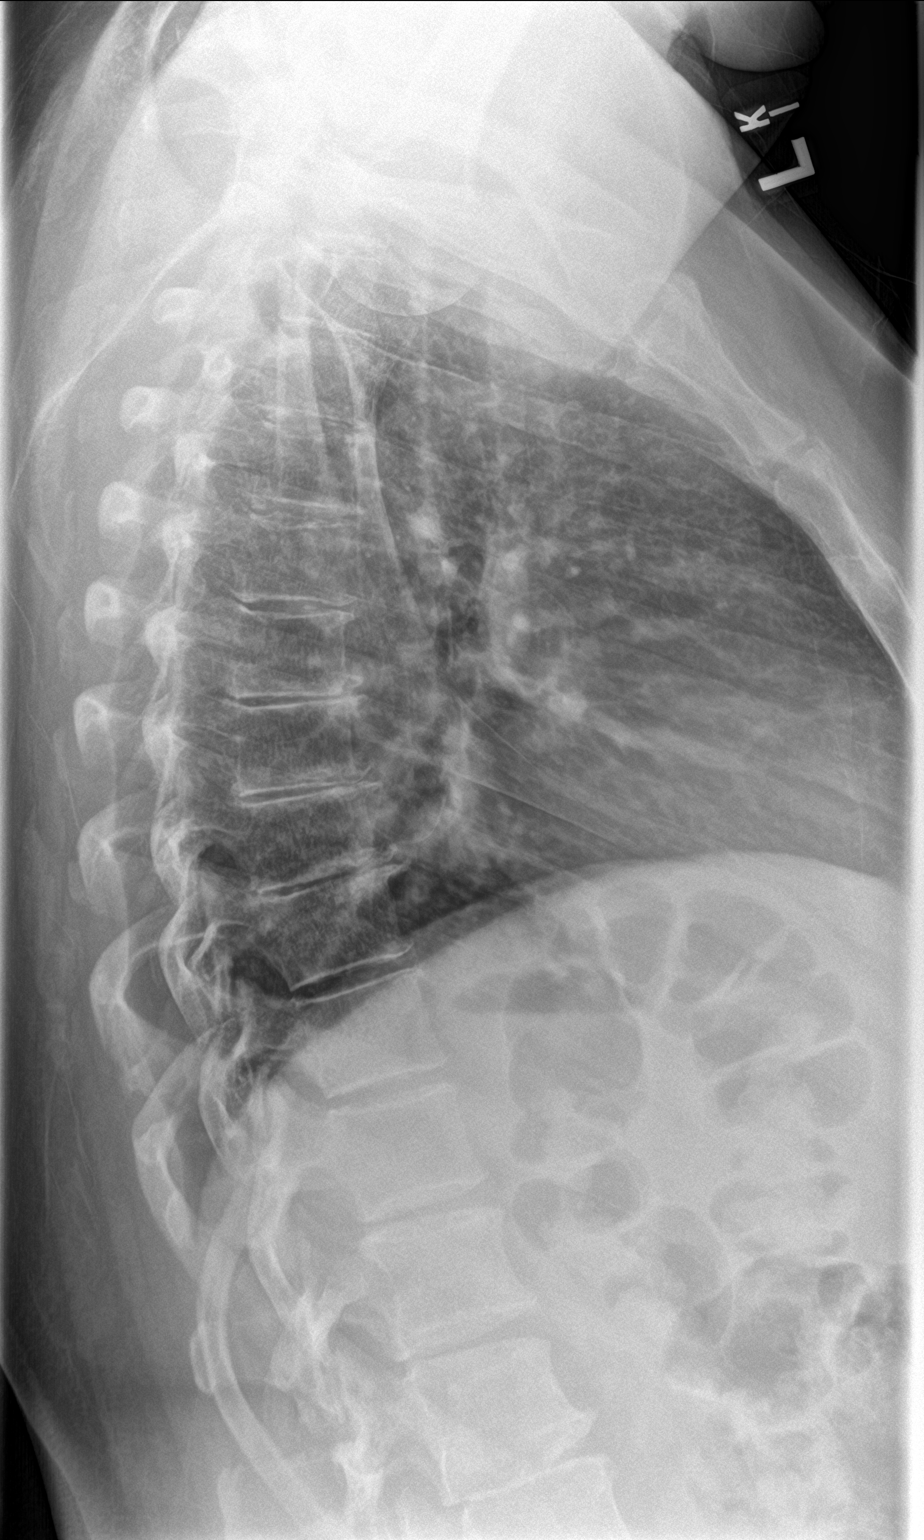

[swimmer]
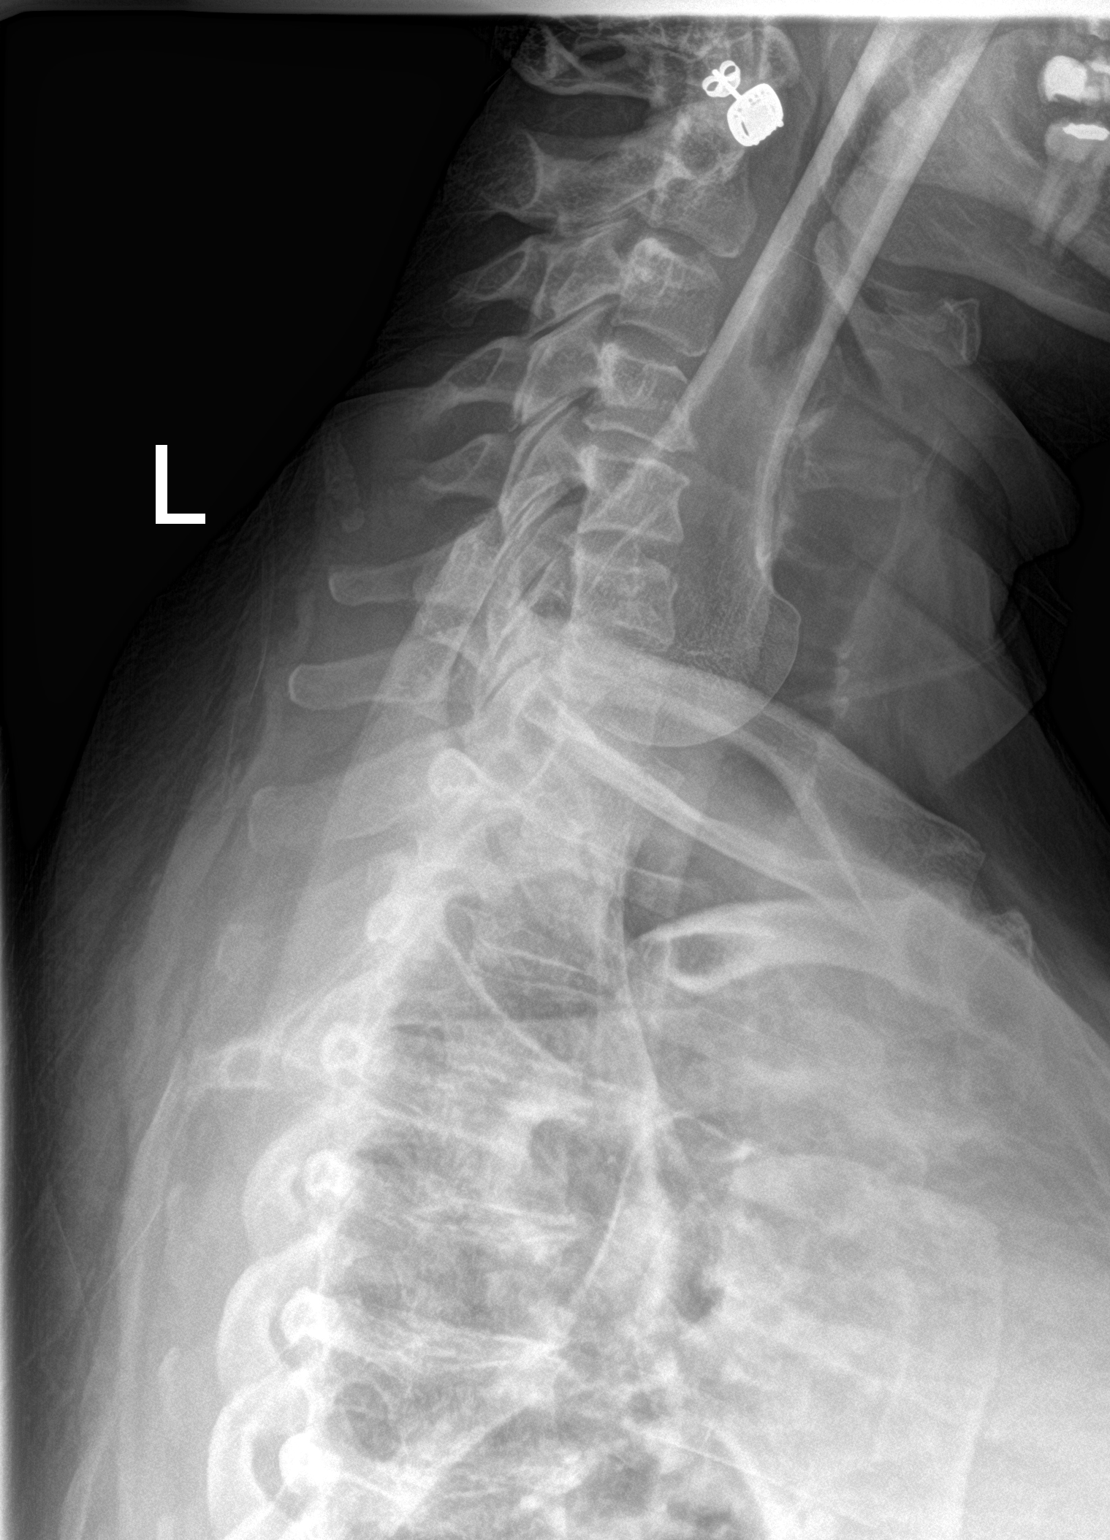

[4 of 4 positions shown; findings below may reference images not displayed]

FINDINGS: Mild thoracic and cervical spine degenerative changes. No fractures
or subluxations.
IMPRESSION: Mild thoracic and cervical spine degenerative changes.

## 2020-05-07 ENCOUNTER — Telehealth: Payer: Self-pay | Admitting: Podiatry

## 2020-05-07 NOTE — Telephone Encounter (Signed)
Patient calling in to inform Dr. Jacqualyn Posey that she received a letter in the mail from her insurance stating she needs prior auth in order for her to get the injection she is scheduled to have tomorrow. Patient states she has had 2 prior injections(same insurance), but didn't need prior auth. Please advise.

## 2020-05-07 NOTE — Telephone Encounter (Signed)
Grace Horton- do you know anything about this? It is for a neuroma injection for a dehydrated sclerosing injection. Thanks!

## 2020-05-08 ENCOUNTER — Encounter: Payer: Self-pay | Admitting: Podiatry

## 2020-05-08 ENCOUNTER — Ambulatory Visit (INDEPENDENT_AMBULATORY_CARE_PROVIDER_SITE_OTHER): Payer: BC Managed Care – PPO

## 2020-05-08 ENCOUNTER — Other Ambulatory Visit: Payer: Self-pay

## 2020-05-08 ENCOUNTER — Ambulatory Visit: Payer: BC Managed Care – PPO | Admitting: Podiatry

## 2020-05-08 DIAGNOSIS — M778 Other enthesopathies, not elsewhere classified: Secondary | ICD-10-CM

## 2020-05-08 DIAGNOSIS — D3610 Benign neoplasm of peripheral nerves and autonomic nervous system, unspecified: Secondary | ICD-10-CM

## 2020-05-08 DIAGNOSIS — M792 Neuralgia and neuritis, unspecified: Secondary | ICD-10-CM

## 2020-05-08 DIAGNOSIS — M779 Enthesopathy, unspecified: Secondary | ICD-10-CM | POA: Diagnosis not present

## 2020-05-08 DIAGNOSIS — Z1211 Encounter for screening for malignant neoplasm of colon: Secondary | ICD-10-CM | POA: Insufficient documentation

## 2020-05-08 DIAGNOSIS — R1310 Dysphagia, unspecified: Secondary | ICD-10-CM | POA: Insufficient documentation

## 2020-05-10 NOTE — Progress Notes (Signed)
Subjective: 50 year old female presents the office for her third dehydrated sclerosing alcohol injection for neuroma in the right foot.  Overall states that she is doing about the same.  Also she has noticed some discomfort to her left foot on the top of the foot.  She does state that she is having numbness to her big toe.  She states that for a while before it became none she is having some tingling.  Since having the pain on top of her left foot.  She denies any recent injury or trauma.  She has no other concerns today. Denies any systemic complaints such as fevers, chills, nausea, vomiting. No acute changes since last appointment, and no other complaints at this time.   Objective: AAO x3, NAD DP/PT pulses palpable bilaterally, CRT less than 3 seconds RIGHT foot: There is tenderness palpation still in the third interspace and small palpable neuroma is identified.  There is no edema, erythema.  No area of pinpoint tenderness. LEFT foot: Mild sprain, prominence of the dorsal aspect of midfoot.  No evidence of soft tissue mass.  There is minimal edema.  There is no erythema or warmth.  Upon percussion this area does seem to cause pain on superficial peroneal nerve towards the big toe.  No pain with calf compression, swelling, warmth, erythema  Assessment: 50 year old female neuroma right foot; left foot neuritis, spurring midfoot  Plan: -All treatment options discussed with the patient including all alternatives, risks, complications.  -Third dehydrated sclerosing injection was performed today.  Demonstrated 1.1 cc dehydrated sclerosing alcohol sent for treated into the third interspace without complications.  Postinjection care discussed.  Tolerated well.  Discussed if no improvement will possibly consider doing more injections. -Neuroma pad was dispensed.  She states these of that helpful. -On the left foot x-rays obtained reviewed.  No evidence of acute fracture.  No significant spurring  identified.  We discussed she is to avoid any pressure to the area as well as releasing her shoes.  Anti-inflammatories as needed.  If continuation we can do a steroid injection. -Patient encouraged to call the office with any questions, concerns, change in symptoms.   Trula Slade DPM

## 2020-05-10 NOTE — Addendum Note (Signed)
Addended by: Celesta Gentile R on: 05/10/2020 05:32 PM   Modules accepted: Level of Service

## 2020-06-05 ENCOUNTER — Other Ambulatory Visit: Payer: Self-pay

## 2020-06-05 ENCOUNTER — Ambulatory Visit: Payer: BC Managed Care – PPO | Admitting: Podiatry

## 2020-06-05 DIAGNOSIS — G5761 Lesion of plantar nerve, right lower limb: Secondary | ICD-10-CM

## 2020-06-05 DIAGNOSIS — M792 Neuralgia and neuritis, unspecified: Secondary | ICD-10-CM | POA: Diagnosis not present

## 2020-06-05 DIAGNOSIS — D3610 Benign neoplasm of peripheral nerves and autonomic nervous system, unspecified: Secondary | ICD-10-CM

## 2020-06-05 MED ORDER — DEXAMETHASONE SODIUM PHOSPHATE 120 MG/30ML IJ SOLN
4.0000 mg | Freq: Once | INTRAMUSCULAR | Status: AC
  Administered 2020-06-05: 4 mg via INTRA_ARTICULAR

## 2020-06-08 NOTE — Progress Notes (Signed)
Subjective: 50 year old female presents the office for her fourthdehydrated sclerosing alcohol injection for neuroma in the right foot.  Also presents today for follow-up evaluation of neuritis in the left foot.  She states that overall symptoms of the left foot are the same.  She has seen some mild improvement on the right foot since starting the injections.  Denies any systemic complaints such as fevers, chills, nausea, vomiting. No acute changes since last appointment, and no other complaints at this time.   Objective: AAO x3, NAD DP/PT pulses palpable bilaterally, CRT less than 3 seconds RIGHT foot: There is tenderness palpation still in the third interspace and no palpable neuroma is identified today but there is still discomfort.  There is no edema, erythema.  No area of pinpoint tenderness. LEFT foot: Mild prominence of the dorsal aspect of midfoot.  No evidence of soft tissue mass.  There is minimal edema.  There is no erythema or warmth.  Upon percussion this area does seem to cause pain on superficial peroneal nerve towards the big toe.  No pain with calf compression, swelling, warmth, erythema  Assessment: 50 year old female neuroma right foot; left foot neuritis, spurring midfoot  Plan: -All treatment options discussed with the patient including all alternatives, risks, complications.  -Fourth dehydrated sclerosing injection was performed today.  Demonstrated 1.1 cc dehydrated sclerosing alcohol sent for treated into the third interspace without complications.  Postinjection care discussed.  Tolerated well.  Discussed if no improvement will possibly consider doing more injections. -Dexamethasone injection performed on the left foot on the area of tenderness.  A mixture of 0.5 cc of dexamethasone phosphate, 0.5 cc of Marcaine plain was infiltrated into the area of tenderness without complications.  Postinjection care discussed.  Return in about 4 weeks (around 07/03/2020) for neuritis,  neuroma.  Trula Slade DPM

## 2020-07-03 ENCOUNTER — Ambulatory Visit: Payer: BC Managed Care – PPO | Admitting: Podiatry

## 2020-07-23 ENCOUNTER — Ambulatory Visit: Payer: BC Managed Care – PPO | Admitting: Podiatry

## 2020-07-23 ENCOUNTER — Other Ambulatory Visit: Payer: Self-pay

## 2020-07-23 DIAGNOSIS — D3610 Benign neoplasm of peripheral nerves and autonomic nervous system, unspecified: Secondary | ICD-10-CM

## 2020-07-23 DIAGNOSIS — M792 Neuralgia and neuritis, unspecified: Secondary | ICD-10-CM | POA: Diagnosis not present

## 2020-07-30 NOTE — Progress Notes (Signed)
Subjective: 50 year old female presents the office today for follow evaluation of neuroma right foot as well as numbness in the left foot.  She states that the last injection of the right foot she did have some increased pain.  She states that overall her pain is not debilitating and she is able to manage it.  She denies any recent injury or trauma or any changes otherwise since I last saw her. Denies any systemic complaints such as fevers, chills, nausea, vomiting. No acute changes since last appointment, and no other complaints at this time.   Objective: AAO x3, NAD DP/PT pulses palpable bilaterally, CRT less than 3 seconds There is still tenderness palpation on third interspace of the right foot.  No area of pinpoint tenderness there is no edema, erythema.  Small palpable neuroma identified.  In the left foot there is still neuritis symptoms on t numbness.  Mostly along the big toe and second toe, towards the top of the foot.  Does not go to the ankle or leg.  No weakness. MMT 5/5. No pain with calf compression, swelling, warmth, erythema  Assessment: Right foot neuroma, left foot neuritis  Plan: -All treatment options discussed with the patient including all alternatives, risks, complications.  -At this point she wishes to hold off another steroid injection.  We discussed using proper offloading.  Dispensed neuroma pads.  Regards to the left foot hopefully this will improve.  Discussed localized nerve compressions can take a while to resolve.  She is can be going to the beach for some time as her dad recently passed away.  Discussed with her if she needs anything let me know. -Patient encouraged to call the office with any questions, concerns, change in symptoms.   Trula Slade DPM

## 2021-05-28 NOTE — Progress Notes (Signed)
?Charlann Boxer D.O. ?[facility] ?[place] ?Phone: [phone] ?Subjective:   ?I, Grace Horton, am serving as a scribe for Dr. Hulan Saas. ? ?This visit occurred during the SARS-CoV-2 public health emergency.  Safety protocols were in place, including screening questions prior to the visit, additional usage of staff PPE, and extensive cleaning of exam room while observing appropriate contact time as indicated for disinfecting solutions.  ? ? ?I'm seeing this patient by the request  of:  Grace Horton, Grace Bell, MD (Inactive) ? ?CC: Right hip pain ? ?OVZ:CHYIFOYDXA  ?Grace Horton is a 51 y.o. female coming in with complaint of R hip and R shoulder pain.  Patient was seen nearly 3 years ago for more of a scapular dyskinesis and was responding well to manipulation.  Patient states that she was caring for her ailing father last year and at some point she strained R shoulder. Pain with ER in anterior portion.  ? ?Also c/o R hip pain. Pain front of hip and radiate into groin and glute. Has a hard time getting comfortable and feels increase in pain with hip flexion. Using ice prn. Tried celebrex for one week and this did not help.  ? ?Onset-  ?Location ?Duration-  ?Character- ?Aggravating factors- ?Reliving factors-  ?Therapies tried-  ?Severity- ? ?  ? ?Past Medical History:  ?Diagnosis Date  ? AC (acromioclavicular) joint bone spurs   ? Anxiety   ? Back pain   ? Chondromalacia   ? Depression   ? GERD (gastroesophageal reflux disease)   ? History of shingles 2011  ? Around Left Eye  ? Joint pain   ? Raynaud disease   ? Swelling   ? in feet or legs  ? Vitamin D deficiency   ? ?Past Surgical History:  ?Procedure Laterality Date  ? CESAREAN SECTION    ? SHOULDER SURGERY Left 2007  ? TONSILLECTOMY    ? ?Social History  ? ?Socioeconomic History  ? Marital status: Divorced  ?  Spouse name: Not on file  ? Number of children: 1  ? Years of education: 63  ? Highest education level: Not on file   ?Occupational History  ? 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  Employer: [employer]  ?Tobacco Use  ? Smoking status: Never  ? Smokeless tobacco: Never  ?Substance and Sexual Activity  ? Alcohol use: No  ? Drug use: No  ? Sexual activity: Yes  ?  Partners: Male  ?Other Topics Concern  ? Not on file  ?Social History Narrative  ? Marital Status: Divorced; (Significant Other - Todd)   ? Children:  Daughter Grace Horton) 1997   ? Pets:  Dog   ? Living Situation: Lives with Grace Horton and daughter.    ? 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 Education: Forensic psychologist  ? Tobacco Use/Exposure:  None   ? Alcohol Use:  Occasional  ? Drug Use:  None  ? Diet:  Regular  ? Exercise:  Walking  ? Hobbies: Reading, Water Ski, Gardening  ?   ?   ?   ? ?Social Determinants of Health  ? ?Financial Resource Strain: Not on file  ?Food Insecurity: Not on file  ?Transportation Needs: Not on file  ?Physical Activity: Not on file  ?Stress: Not on file  ?Social Connections: Not on file  ? ?Allergies  ?Allergen Reactions  ? Latex Other (See Comments)  ? Levonorgestrel-Ethinyl Estrad Other (See Comments)  ? ?  Family History  ?Problem Relation Age of Onset  ? Heart disease Father   ? Diabetes Father   ? Kidney disease Father   ?     Stage III   ? Transient ischemic attack Father   ?     S/P Carotid endarterectomy  ? Hypertension Father   ? Hyperlipidemia Father   ? Irritable bowel syndrome Mother   ? Hypertension Mother   ? Hyperlipidemia Mother   ? Obesity Mother   ? ? ?Current Outpatient Medications (Endocrine & Metabolic):  ?  ethynodiol-ethinyl estradiol (KELNOR 1/35) 1-35 MG-MCG tablet, TAKE 1 TABLET BY MOUTH EVERY DAY (NO PLACEBO) ?  levonorgestrel-ethinyl estradiol (AVIANE,ALESSE,LESSINA) 0.1-20 MG-MCG tablet, Take 1 tablet by mouth daily. ?  liraglutide (VICTOZA) 18 MG/3ML SOPN, Inject 3 mg into the skin daily. ? ?Current Outpatient Medications (Cardiovascular):  ?  hydrochlorothiazide (MICROZIDE) 12.5 MG  capsule, Take by mouth. ? ?Current Outpatient Medications (Respiratory):  ?  fluticasone (FLONASE) 50 MCG/ACT nasal spray, 2 sprays in each nostril daily ? ?Current Outpatient Medications (Analgesics):  ?  meloxicam (MOBIC) 15 MG tablet, Take 1 tablet (15 mg total) by mouth daily. ? ? ?Current Outpatient Medications (Other):  ?  influenza vac split quadrivalent PF (FLUZONE QUADRIVALENT) 0.5 ML injection, Fluzone Quad 2020-2021 (PF) 60 mcg (15 mcg x 4)/0.5 mL IM syringe  PHARMACY ADMINISTERED ?  influenza vaccine (FLUCELVAX QUADRIVALENT) 0.5 ML injection, Flucelvax Quad 2019-2020 (PF) 60 mcg (15 mcg x 4)/0.5 mL IM syringe  TO BE ADMINISTERED BY PHARMACIST FOR IMMUNIZATION ?  Vitamin D, Ergocalciferol, (DRISDOL) 1.25 MG (50000 UNIT) CAPS capsule, TAKE 1 CAPSULE BY MOUTH weekly ?  cephALEXin (KEFLEX) 500 MG capsule, cephalexin 500 mg capsule ?  Diclofenac Sodium 2 % SOLN, Place 2 g onto the skin 2 (two) times daily. ?  escitalopram (LEXAPRO) 5 MG tablet, escitalopram 5 mg tablet ?  fluconazole (DIFLUCAN) 200 MG tablet, fluconazole 200 mg tablet ?  hydrOXYzine (ATARAX/VISTARIL) 25 MG tablet, hydroxyzine HCl 25 mg tablet ?  Insulin Pen Needle (BD PEN NEEDLE NANO U/F) 32G X 4 MM MISC, Inject 1 each into the skin daily. ?  Liraglutide -Weight Management (SAXENDA) 18 MG/3ML SOPN, Inject 3 mg into the skin daily. ?  Lorcaserin HCl 10 MG TABS, Belviq 10 mg tablet ?  Phentermine-Topiramate (QSYMIA) 3.75-23 MG CP24, Qsymia 3.75 mg-23 mg capsule, extended release  TAKE ONE CAPSULE BY MOUTH EVERY DAY ?  Semaglutide-Weight Management 0.5 MG/0.5ML SOAJ, Inject into the skin. ?  valACYclovir (VALTREX) 1000 MG tablet, valacyclovir 1 gram tablet  TAKE 1 TABLET BY MOUTH TWICE A DAY AS NEEDED ? ? ?Reviewed prior external information including notes and imaging from  ?primary care provider ?As well as notes that were available from care everywhere and other healthcare systems. ? ?Past medical history, social, surgical and family history  all reviewed in electronic medical record.  No pertanent information unless stated regarding to the chief complaint.  ? ?Review of Systems: ? No headache, visual changes, nausea, vomiting, diarrhea, constipation, dizziness, abdominal pain, skin rash, fevers, chills, night sweats, weight loss, swollen lymph nodes, body aches, joint swelling, chest pain, shortness of breath, mood changes. POSITIVE muscle aches ? ?Objective  ?Blood pressure 110/82, pulse 78, height '5\' 2"'$  (1.575 m), weight 215 lb (97.5 kg), SpO2 96 %. ?  ?General: No apparent distress alert and oriented x3 mood and affect normal, dressed appropriately.  ?HEENT: Pupils equal, extraocular movements intact  ?Respiratory: Patient's speak in full sentences and does not appear short  of breath  ?Cardiovascular: No lower extremity edema, non tender, no erythema  ?Gait normal with good balance and coordination.  ?MSK: Right hip exam does have positive impingement noted with internal and external range of motion.  Patient has some pain with hip flexion noted against resistance.  But does have full strength noted.  Negative straight leg test.  Patient does have scapular dyskinesis noted right greater than left.  Right shoulder does show some impingement noted on the Neer and Hawkins.  5 out of 5 strength of the rotator cuff. ? ?Limited muscular skeletal ultrasound was performed and interpreted by Hulan Saas, M  ?Limited musculoskeletal ultrasound of the shoulder shows some mild hypoechoic changes in the subacromial area.  Patient also has some very mild subacromial changes but with some scapularis.  No tearing noted.  Mild arthritic changes of the acromioclavicular joint. ?Impression: Shoulder bursitis ? ?Osteopathic findings ?C2 flexed rotated and side bent right ?C4 flexed rotated and side bent left ?C6 flexed rotated and side bent left ?T3 extended rotated and side bent right inhaled third rib ?T9 extended rotated and side bent left ?L2 flexed rotated and  side bent right ?Sacrum right on right ? ?97110; 15 additional minutes spent for Therapeutic exercises as stated in above notes.  This included exercises focusing on stretching, strengthening, with sig

## 2021-05-29 ENCOUNTER — Encounter: Payer: Self-pay | Admitting: Family Medicine

## 2021-05-29 ENCOUNTER — Ambulatory Visit: Payer: Self-pay

## 2021-05-29 ENCOUNTER — Ambulatory Visit (INDEPENDENT_AMBULATORY_CARE_PROVIDER_SITE_OTHER): Payer: BC Managed Care – PPO

## 2021-05-29 ENCOUNTER — Ambulatory Visit: Payer: BC Managed Care – PPO | Admitting: Family Medicine

## 2021-05-29 VITALS — BP 110/82 | HR 78 | Ht 62.0 in | Wt 215.0 lb

## 2021-05-29 DIAGNOSIS — M25511 Pain in right shoulder: Secondary | ICD-10-CM

## 2021-05-29 DIAGNOSIS — M25551 Pain in right hip: Secondary | ICD-10-CM

## 2021-05-29 DIAGNOSIS — M25851 Other specified joint disorders, right hip: Secondary | ICD-10-CM

## 2021-05-29 DIAGNOSIS — M9903 Segmental and somatic dysfunction of lumbar region: Secondary | ICD-10-CM

## 2021-05-29 DIAGNOSIS — M999 Biomechanical lesion, unspecified: Secondary | ICD-10-CM

## 2021-05-29 DIAGNOSIS — M9901 Segmental and somatic dysfunction of cervical region: Secondary | ICD-10-CM

## 2021-05-29 DIAGNOSIS — M9902 Segmental and somatic dysfunction of thoracic region: Secondary | ICD-10-CM

## 2021-05-29 DIAGNOSIS — M7551 Bursitis of right shoulder: Secondary | ICD-10-CM

## 2021-05-29 DIAGNOSIS — M9904 Segmental and somatic dysfunction of sacral region: Secondary | ICD-10-CM | POA: Diagnosis not present

## 2021-05-29 DIAGNOSIS — G2589 Other specified extrapyramidal and movement disorders: Secondary | ICD-10-CM

## 2021-05-29 DIAGNOSIS — M9908 Segmental and somatic dysfunction of rib cage: Secondary | ICD-10-CM | POA: Diagnosis not present

## 2021-05-29 MED ORDER — MELOXICAM 15 MG PO TABS
15.0000 mg | ORAL_TABLET | Freq: Every day | ORAL | 0 refills | Status: DC
Start: 2021-05-29 — End: 2021-06-25

## 2021-05-29 NOTE — Patient Instructions (Addendum)
Exercises ?Ice 20 min ?Meloxicam '15mg'$  daily for 2 weeks then as needed ?See me again in 6-8 weeks ?

## 2021-05-29 NOTE — Assessment & Plan Note (Signed)
Hip impingement noted.  Discussed icing regimen and home exercises, which activities to do and which ones to avoid, increase activity slowly.  Patient does have x-rays showing that there is a possibility of a cam deformity also noted of the hip.  Worsening pain may need to consider advanced imaging. ?

## 2021-05-29 NOTE — Assessment & Plan Note (Signed)

## 2021-05-29 NOTE — Assessment & Plan Note (Signed)
Scapular dyskinesis secondary to muscle imbalances.  Discussed icing regimen and home exercise, which activities to do which ones to avoid, increase activity slowly.  Follow-up again in 6 to 8 week.  Responded extremely well to osteopathic manipulation. ?

## 2021-05-29 NOTE — Assessment & Plan Note (Signed)
Patient is signs and symptoms consistent with more of a shoulder bursitis.  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  Discussed with patient about home exercises, icing regimen, x-rays pending.  Increase activity slowly.  Worsening pain patient will come back and we will consider formal physical and potential injection. ?

## 2021-06-25 ENCOUNTER — Other Ambulatory Visit: Payer: Self-pay | Admitting: Family Medicine

## 2021-07-09 NOTE — Progress Notes (Unsigned)
[facility] [place] Phone: [phone] Subjective:   Fontaine No, am serving as a scribe for Dr. Hulan Saas.   I'm seeing this patient by the request  of:  Zanard, Bernadene Bell, MD (Inactive)  CC: hip and back pain   IFO:YDXAJOINOM  Meridee Larkin is a 51 y.o. female coming in with complaint of back and neck pain. OMT on 05/29/2021. Also seen for right shoulder pain. Patient states that she pressure washed her boat and this aggravated her shoulder.   Drove back from the beach yesterday and her hip pain increased. Patient did limit activity and took Meloxicam for 2 weeks. Felt some improvement in hip and shoulder. Less discomfort during the day. Has used meloxicam prn since but has run out and would like recommendations on how to continue.   Medications patient has been prescribed: Mobic  Taking:         Reviewed prior external information including notes and imaging from previsou exam, outside providers and external EMR if available.   As well as notes that were available from care everywhere and other healthcare systems.  Past medical history, social, surgical and family history all reviewed in electronic medical record.  No pertanent information unless stated regarding to the chief complaint.   Past Medical History:  Diagnosis Date   AC (acromioclavicular) joint bone spurs    Anxiety    Back pain    Chondromalacia    Depression    GERD (gastroesophageal reflux disease)    History of shingles 2011   Around Left Eye   Joint pain    Raynaud disease    Swelling    in feet or legs   Vitamin D deficiency     Allergies  Allergen Reactions   Latex Other (See Comments)   Levonorgestrel-Ethinyl Estrad Other (See Comments)     Review of Systems:  No headache, visual changes, nausea, vomiting, diarrhea, constipation, dizziness, abdominal pain, skin rash, fevers, chills, night sweats, weight loss, swollen  lymph nodes, body aches, joint swelling, chest pain, shortness of breath, mood changes. POSITIVE muscle aches  Objective  Blood pressure 116/86, pulse 73, height '5\' 2"'$  (1.575 m), weight 228 lb (103.4 kg), SpO2 98 %.   General: No apparent distress alert and oriented x3 mood and affect normal, dressed appropriately.  HEENT: Pupils equal, extraocular movements intact  Respiratory: Patient's speak in full sentences and does not appear short of breath  Cardiovascular: No lower extremity edema, non tender, no erythema  Patient does have some tenderness to palpation in the paraspinal musculature of the lumbar spine.  More tightness are noted over the right side of the hip.  Severely tender to palpation over the greater trochanteric area.  Positive Corky Sox noted on the right side as well.  Osteopathic findings  C3 flexed rotated and side bent right T9 extended rotated and side bent left L1 flexed rotated and side bent right Sacrum right on right Pelvic shear noted   Procedure: Real-time Ultrasound Guided Injection of right greater trochanteric bursitis secondary to patient's body habitus Device: GE Logiq Q7 Ultrasound guided injection is preferred based studies that show increased duration, increased effect, greater accuracy, decreased procedural pain, increased response rate, and decreased cost with ultrasound guided versus blind injection.  Verbal informed consent obtained.  Time-out conducted.  Noted no overlying erythema, induration, or other signs of local infection.  Skin prepped in a sterile fashion.  Local anesthesia: Topical Ethyl chloride.  With sterile technique and under real time ultrasound guidance:  Greater trochanteric area was visualized and patient's bursa was noted. A 22-gauge 3 inch needle was inserted and 4 cc of 0.5% Marcaine and 1 cc of Kenalog 40 mg/dL was injected. Pictures taken Completed without difficulty  Advised to call if fevers/chills, erythema, induration,  drainage, or persistent bleeding.  Impression: Technically successful ultrasound guided injection.    Assessment and Plan:  Greater trochanteric bursitis of right hip Patient given injection and tolerated the procedure well, discussed icing regimen and home exercise, discussed which activities to do and which ones to avoid.  Still believe that there is some impingement of the right hip that we may continue to monitor.  Worsening pain consider the possibility of advanced imaging with more arthrogram  Right hip impingement syndrome Still concerning for more of the arthrogram being necessary if this continues.  We will see how patient responds to the greater trochanteric area.  Follow-up with me again in 6 to 8 weeks.    Nonallopathic problems  Decision today to treat with OMT was based on Physical Exam  After verbal consent patient was treated with HVLA, ME, FPR techniques in cervical, pelvis, thoracic, lumbar, and sacral  areas  Patient tolerated the procedure well with improvement in symptoms  Patient given exercises, stretches and lifestyle modifications  See medications in patient instructions if given  Patient will follow up in 4-8 weeks      The above documentation has been reviewed and is accurate and complete Lyndal Pulley, DO        Note: This dictation was prepared with Dragon dictation along with smaller phrase technology. Any transcriptional errors that result from this process are unintentional.

## 2021-07-10 ENCOUNTER — Ambulatory Visit: Payer: Self-pay

## 2021-07-10 ENCOUNTER — Ambulatory Visit: Payer: BC Managed Care – PPO | Admitting: Family Medicine

## 2021-07-10 VITALS — BP 116/86 | HR 73 | Ht 62.0 in | Wt 228.0 lb

## 2021-07-10 DIAGNOSIS — M9901 Segmental and somatic dysfunction of cervical region: Secondary | ICD-10-CM

## 2021-07-10 DIAGNOSIS — M9904 Segmental and somatic dysfunction of sacral region: Secondary | ICD-10-CM | POA: Diagnosis not present

## 2021-07-10 DIAGNOSIS — M25511 Pain in right shoulder: Secondary | ICD-10-CM

## 2021-07-10 DIAGNOSIS — M9903 Segmental and somatic dysfunction of lumbar region: Secondary | ICD-10-CM | POA: Diagnosis not present

## 2021-07-10 DIAGNOSIS — M25851 Other specified joint disorders, right hip: Secondary | ICD-10-CM

## 2021-07-10 DIAGNOSIS — M7061 Trochanteric bursitis, right hip: Secondary | ICD-10-CM

## 2021-07-10 DIAGNOSIS — M9905 Segmental and somatic dysfunction of pelvic region: Secondary | ICD-10-CM

## 2021-07-10 DIAGNOSIS — M9902 Segmental and somatic dysfunction of thoracic region: Secondary | ICD-10-CM

## 2021-07-10 MED ORDER — MELOXICAM 15 MG PO TABS: 15.0000 mg | ORAL_TABLET | Freq: Every day | ORAL | 0 refills | Status: DC

## 2021-07-10 NOTE — Assessment & Plan Note (Signed)
Patient given injection and tolerated the procedure well, discussed icing regimen and home exercise, discussed which activities to do and which ones to avoid.  Still believe that there is some impingement of the right hip that we may continue to monitor.  Worsening pain consider the possibility of advanced imaging with more arthrogram

## 2021-07-10 NOTE — Patient Instructions (Signed)
Injected GT today If not better in 2 weeks send message and we will do MRI of hip See me again in 7-8 weeks or when you are back in town

## 2021-07-10 NOTE — Assessment & Plan Note (Signed)
Still concerning for more of the arthrogram being necessary if this continues.  We will see how patient responds to the greater trochanteric area.  Follow-up with me again in 6 to 8 weeks.

## 2021-07-26 ENCOUNTER — Encounter: Payer: Self-pay | Admitting: Family Medicine

## 2021-09-10 ENCOUNTER — Other Ambulatory Visit: Payer: Self-pay | Admitting: Family Medicine

## 2021-09-18 ENCOUNTER — Encounter (INDEPENDENT_AMBULATORY_CARE_PROVIDER_SITE_OTHER): Payer: Self-pay

## 2021-11-26 ENCOUNTER — Ambulatory Visit: Payer: Self-pay

## 2021-11-26 ENCOUNTER — Ambulatory Visit: Payer: BC Managed Care – PPO | Admitting: Family Medicine

## 2021-11-26 VITALS — BP 128/76 | HR 79 | Ht 62.0 in | Wt 223.0 lb

## 2021-11-26 DIAGNOSIS — M9903 Segmental and somatic dysfunction of lumbar region: Secondary | ICD-10-CM

## 2021-11-26 DIAGNOSIS — M7061 Trochanteric bursitis, right hip: Secondary | ICD-10-CM

## 2021-11-26 DIAGNOSIS — M9901 Segmental and somatic dysfunction of cervical region: Secondary | ICD-10-CM | POA: Diagnosis not present

## 2021-11-26 DIAGNOSIS — M9904 Segmental and somatic dysfunction of sacral region: Secondary | ICD-10-CM | POA: Diagnosis not present

## 2021-11-26 DIAGNOSIS — M9902 Segmental and somatic dysfunction of thoracic region: Secondary | ICD-10-CM

## 2021-11-26 DIAGNOSIS — M9908 Segmental and somatic dysfunction of rib cage: Secondary | ICD-10-CM

## 2021-11-26 DIAGNOSIS — M25851 Other specified joint disorders, right hip: Secondary | ICD-10-CM

## 2021-11-26 NOTE — Patient Instructions (Signed)
I hope your daughter didn't take puppies Sandals in house Heel cup in shoes Careful stepping in holes

## 2021-11-26 NOTE — Progress Notes (Unsigned)
[name] Phone: [phone] Subjective:    I'm seeing this patient by the request  of:  Zanard, Bernadene Bell, MD (Inactive)  CC: Low back pain follow-up  OVZ:CHYIFOYDXA  Grace Horton is a 51 y.o. female coming in with complaint of back and neck pain. OMT on 07/10/2021. Also seen for hip pain. Patient states following up on pain in hip. Thinks it's out of line. Also having pain in bottom of right foot for the past 3 weeks. Pain is dull, feels tight, and is sporadic in nature.  Medications patient has been prescribed: Meloxicam  Taking: Intermittently         Reviewed prior external information including notes and imaging from previsou exam, outside providers and external EMR if available.   As well as notes that were available from care everywhere and other healthcare systems.  Past medical history, social, surgical and family history all reviewed in electronic medical record.  No pertanent information unless stated regarding to the chief complaint.   Past Medical History:  Diagnosis Date   AC (acromioclavicular) joint bone spurs    Anxiety    Back pain    Chondromalacia    Depression    GERD (gastroesophageal reflux disease)    History of shingles 2011   Around Left Eye   Joint pain    Raynaud disease    Swelling    in feet or legs   Vitamin D deficiency     Allergies  Allergen Reactions   Latex Other (See Comments)   Levonorgestrel-Ethinyl Estrad Other (See Comments)     Review of Systems:  No headache, visual changes, nausea, vomiting, diarrhea, constipation, dizziness, abdominal pain, skin rash, fevers, chills, night sweats, weight loss, swollen lymph nodes, body aches, joint swelling, chest pain, shortness of breath, mood changes. POSITIVE muscle aches  Objective  Blood pressure 128/76, pulse 79, height '5\' 2"'$  (1.575 m), weight 223 lb (101.2 kg), SpO2 95 %.   General: No apparent distress  alert and oriented x3 mood and affect normal, dressed appropriately.  HEENT: Pupils equal, extraocular movements intact  Respiratory: Patient's speak in full sentences and does not appear short of breath  Cardiovascular: No lower extremity edema, non tender, no erythema  Low back exam does have some loss of lordosis.  Some tenderness to the paraspinal musculature.  More tenderness over the right greater trochanteric area as well.  Osteopathic findings  C3 flexed rotated and side bent right C6 flexed rotated and side bent left T3 extended rotated and side bent right inhaled rib T9 extended rotated and side bent left L2 flexed rotated and side bent right Sacrum right on right     Assessment and Plan:  Right hip impingement syndrome Continues to have more tightness on the right side.  Nothing that is stopping her from activity but still can be aggravating.  Did have significant tightness more in the scapular area today.  Discussed with patient to continue to work on posture and ergonomics throughout the day.  Patient does have the meloxicam 15 mg daily and can take it in 5-day burst.  Follow-up with me again for this chronic problem with exacerbation for further evaluation and management 6 to 8 weeks    Nonallopathic problems  Decision today to treat with OMT was based on Physical Exam  After verbal consent patient was treated with HVLA, ME, FPR techniques in cervical, rib, thoracic, lumbar, and sacral  areas  Patient  tolerated the procedure well with improvement in symptoms  Patient given exercises, stretches and lifestyle modifications  See medications in patient instructions if given  Patient will follow up in 4-8 weeks     The above documentation has been reviewed and is accurate and complete Lyndal Pulley, DO         Note: This dictation was prepared with Dragon dictation along with smaller phrase technology. Any transcriptional errors that result from this process  are unintentional.

## 2021-11-27 NOTE — Assessment & Plan Note (Signed)
Continues to have more tightness on the right side.  Nothing that is stopping her from activity but still can be aggravating.  Did have significant tightness more in the scapular area today.  Discussed with patient to continue to work on posture and ergonomics throughout the day.  Patient does have the meloxicam 15 mg daily and can take it in 5-day burst.  Follow-up with me again for this chronic problem with exacerbation for further evaluation and management 6 to 8 weeks

## 2022-09-22 NOTE — Progress Notes (Unsigned)
[facility] [address] [place] [address] Phone: [phone] Subjective:   INadine Counts, am serving as a scribe for Dr. Antoine Primas.  I'm seeing this patient by the request  of:  Zanard, Hinton Dyer, MD  CC: back and neck pain follow up hip exam follow up   [id number]  Grace Horton is a 52 y.o. female coming in with complaint of back and neck pain. OMT 11/16/2021. Patient states right hip, shoulder, elbow. Elbow hurts more than shoulder. There is an ache that is becoming more constant on R arm.   Medications patient has been prescribed: None  Taking:         Reviewed prior external information including notes and imaging from previsou exam, outside providers and external EMR if available.   As well as notes that were available from care everywhere and other healthcare systems.  Past medical history, social, surgical and family history all reviewed in electronic medical record.  No pertanent information unless stated regarding to the chief complaint.   Past Medical History:  Diagnosis Date   AC (acromioclavicular) joint bone spurs    Anxiety    Back pain    Chondromalacia    Depression    GERD (gastroesophageal reflux disease)    History of shingles 2011   Around Left Eye   Joint pain    Raynaud disease    Swelling    in feet or legs   Vitamin D deficiency     Allergies  Allergen Reactions   Latex Other (See Comments)   Levonorgestrel-Ethinyl Estrad Other (See Comments)     Review of Systems:  No headache, visual changes, nausea, vomiting, diarrhea, constipation, dizziness, abdominal pain, skin rash, fevers, chills, night sweats, weight loss, swollen lymph nodes, body aches, joint swelling, chest pain, shortness of breath, mood changes. POSITIVE muscle aches  Objective  Blood pressure 116/78, pulse 80, height 5\' 2"  (1.575 m), weight 220 lb (99.8 kg), SpO2 98%.   General: No apparent distress alert and oriented x3  mood and affect normal, dressed appropriately.  HEENT: Pupils equal, extraocular movements intact  Respiratory: Patient's speak in full sentences and does not appear short of breath  Cardiovascular: No lower extremity edema, non tender, no erythema  Gait normal  MSK:  Back tightness noted mostly around the right sacroiliac joint.  Positive Faber on the right side.  Severe tenderness still over the greater trochanteric area.   Procedure: Real-time Ultrasound Guided Injection of right greater trochanteric bursitis secondary to patient's body habitus Device: GE Logiq Q7 Ultrasound guided injection is preferred based studies that show increased duration, increased effect, greater accuracy, decreased procedural pain, increased response rate, and decreased cost with ultrasound guided versus blind injection.  Verbal informed consent obtained.  Time-out conducted.  Noted no overlying erythema, induration, or other signs of local infection.  Skin prepped in a sterile fashion.  Local anesthesia: Topical Ethyl chloride.  With sterile technique and under real time ultrasound guidance:  Greater trochanteric area was visualized and patient's bursa was noted. A 22-gauge 3 inch needle was inserted and 4 cc of 0.5% Marcaine and 1 cc of Kenalog 40 mg/dL was injected. Pictures taken Completed without difficulty  Pain immediately resolved suggesting accurate placement of the medication.  Advised to call if fevers/chills, erythema, induration, drainage, or persistent bleeding.  Impression: Technically successful ultrasound guided injection.  Osteopathic findings  C2 flexed rotated and side bent right C6 flexed rotated and side bent left T3  extended rotated and side bent right inhaled rib L3 flexed rotated and side bent right Sacrum right on right     Assessment and Plan:  Greater trochanteric bursitis of right hip Repeat injection given today and tolerated the procedure well, discussed icing regimen  and home exercises, discussed which activities to do and which ones to avoid.  Increase activity slowly over the course of next several weeks.  Discussed hip abductor strengthening as well as weight loss.  Follow-up again in 6 to 8 weeks.  Cervical disc disorder with radiculopathy of cervical region Responds relatively well to osteopathic manipulation.  Discussed home exercises and icing regimen.  Increase activity slowly.  Follow-up again in 6 to 8 weeks.    Nonallopathic problems  Decision today to treat with OMT was based on Physical Exam  After verbal consent patient was treated with HVLA, ME, FPR techniques in cervical, rib, thoracic, lumbar, and sacral  areas  Patient tolerated the procedure well with improvement in symptoms  Patient given exercises, stretches and lifestyle modifications  See medications in patient instructions if given  Patient will follow up in 4-8 weeks    The above documentation has been reviewed and is accurate and complete Judi Saa, DO          Note: This dictation was prepared with Dragon dictation along with smaller phrase technology. Any transcriptional errors that result from this process are unintentional.

## 2022-09-23 ENCOUNTER — Other Ambulatory Visit: Payer: Self-pay

## 2022-09-23 ENCOUNTER — Ambulatory Visit: Payer: BC Managed Care – PPO | Admitting: Family Medicine

## 2022-09-23 ENCOUNTER — Encounter: Payer: Self-pay | Admitting: Family Medicine

## 2022-09-23 VITALS — BP 116/78 | HR 80 | Ht 62.0 in | Wt 220.0 lb

## 2022-09-23 DIAGNOSIS — M25511 Pain in right shoulder: Secondary | ICD-10-CM | POA: Diagnosis not present

## 2022-09-23 DIAGNOSIS — M9901 Segmental and somatic dysfunction of cervical region: Secondary | ICD-10-CM | POA: Diagnosis not present

## 2022-09-23 DIAGNOSIS — M9902 Segmental and somatic dysfunction of thoracic region: Secondary | ICD-10-CM

## 2022-09-23 DIAGNOSIS — M9904 Segmental and somatic dysfunction of sacral region: Secondary | ICD-10-CM | POA: Diagnosis not present

## 2022-09-23 DIAGNOSIS — M9903 Segmental and somatic dysfunction of lumbar region: Secondary | ICD-10-CM | POA: Diagnosis not present

## 2022-09-23 DIAGNOSIS — M7061 Trochanteric bursitis, right hip: Secondary | ICD-10-CM

## 2022-09-23 DIAGNOSIS — M9908 Segmental and somatic dysfunction of rib cage: Secondary | ICD-10-CM

## 2022-09-23 DIAGNOSIS — M7711 Lateral epicondylitis, right elbow: Secondary | ICD-10-CM | POA: Diagnosis not present

## 2022-09-23 DIAGNOSIS — M5010 Cervical disc disorder with radiculopathy, unspecified cervical region: Secondary | ICD-10-CM

## 2022-09-23 NOTE — Patient Instructions (Addendum)
Do prescribed exercises at least 3x a week GT injection Brace day and night for 2 weeks Then only at night for another 2 weeks You have 14 days to return or exchange your brace Call [phone], then return the brace to our office  See you again in 8 weeks

## 2022-09-23 NOTE — Assessment & Plan Note (Signed)
Responds relatively well to osteopathic manipulation.  Discussed home exercises and icing regimen.  Increase activity slowly.  Follow-up again in 6 to 8 weeks.

## 2022-09-23 NOTE — Assessment & Plan Note (Signed)
Elbow anatomy was reviewed, and tendinopathy was explained.  Pt. given a home rehab program. Start with isometrics and ROM, then a series of concentric and eccentric exercises should be done starting with no weight, work up to 1 lb, hammer, etc.  Use counterforce strap if working or using hands.  Formal PT would be beneficial. Emphasized stretching an cross-friction massage Emphasized proper palms up lifting biomechanics to unload ECRB  

## 2022-09-23 NOTE — Assessment & Plan Note (Signed)
Repeat injection given today and tolerated the procedure well, discussed icing regimen and home exercises, discussed which activities to do and which ones to avoid.  Increase activity slowly over the course of next several weeks.  Discussed hip abductor strengthening as well as weight loss.  Follow-up again in 6 to 8 weeks.

## 2023-06-02 IMAGING — DX DG SHOULDER 2+V*R*
3 series · 3 of 3 positions shown · non-contrast
Comparison: None.

CLINICAL DATA: Right shoulder pain.

EXAM:
RIGHT SHOULDER - 2+ VIEW

[shoulder ap (1 of 2)]
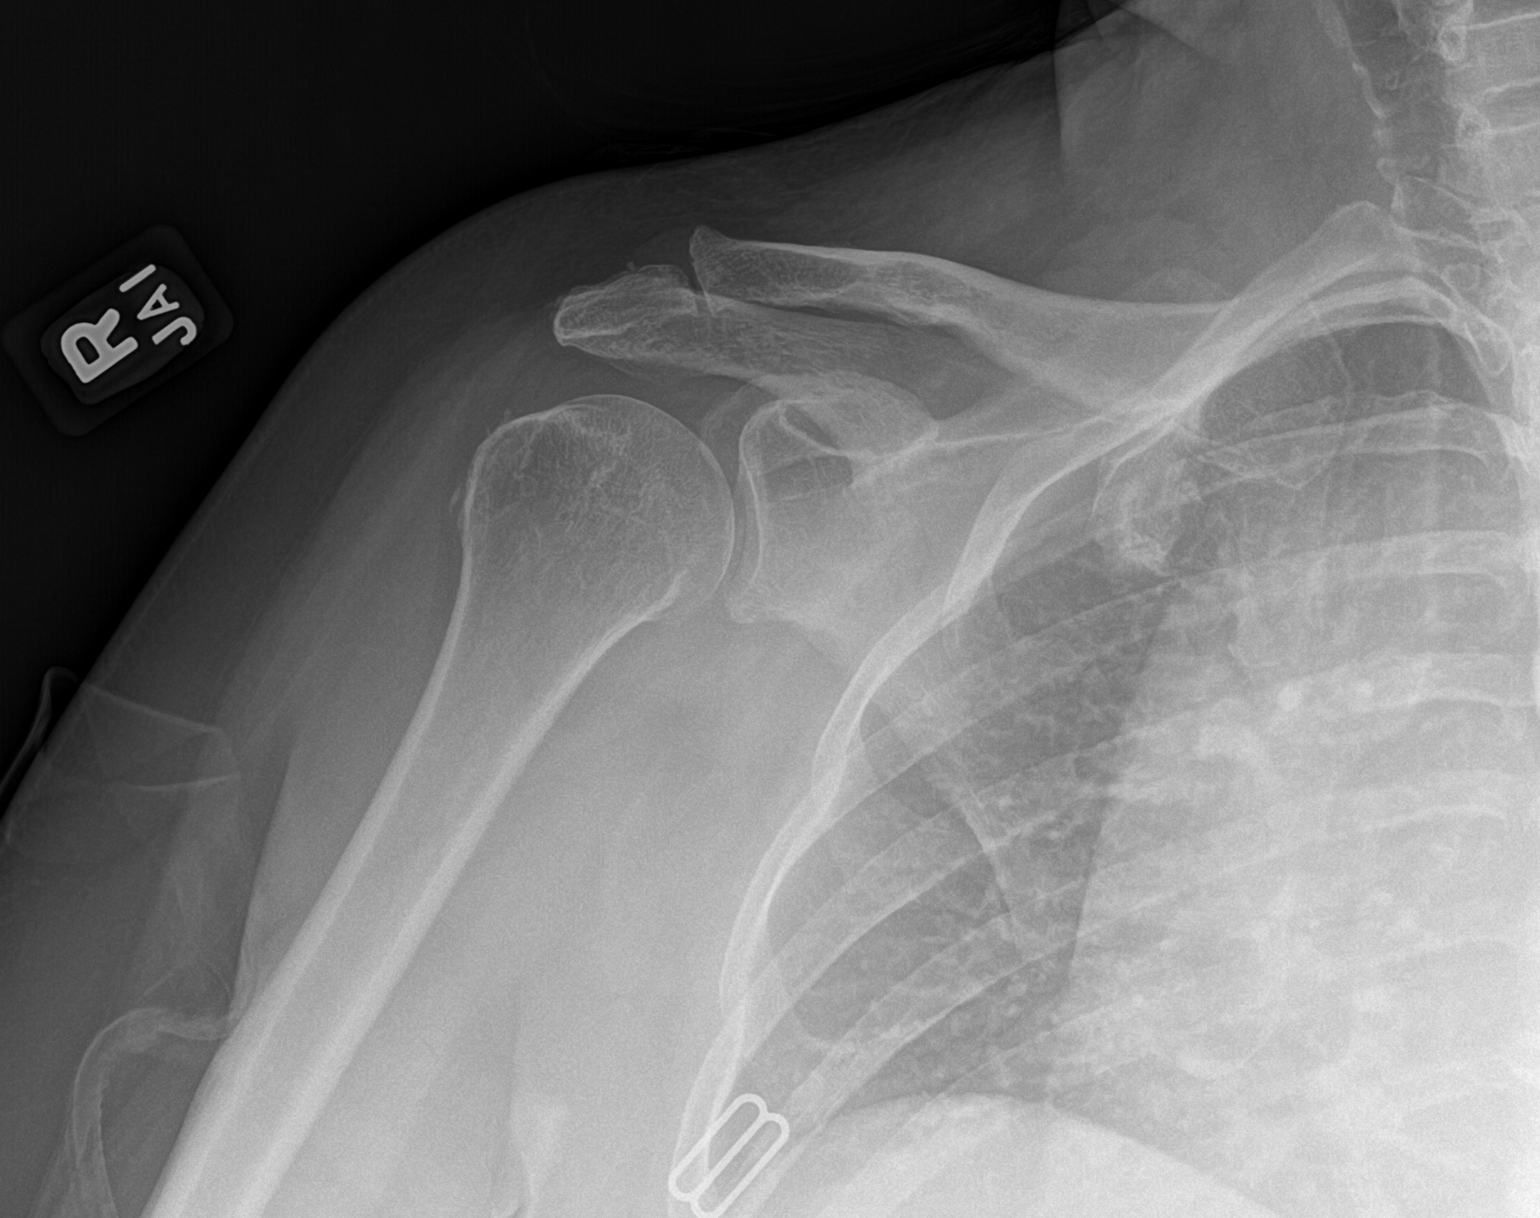

[shoulder ap (2 of 2)]
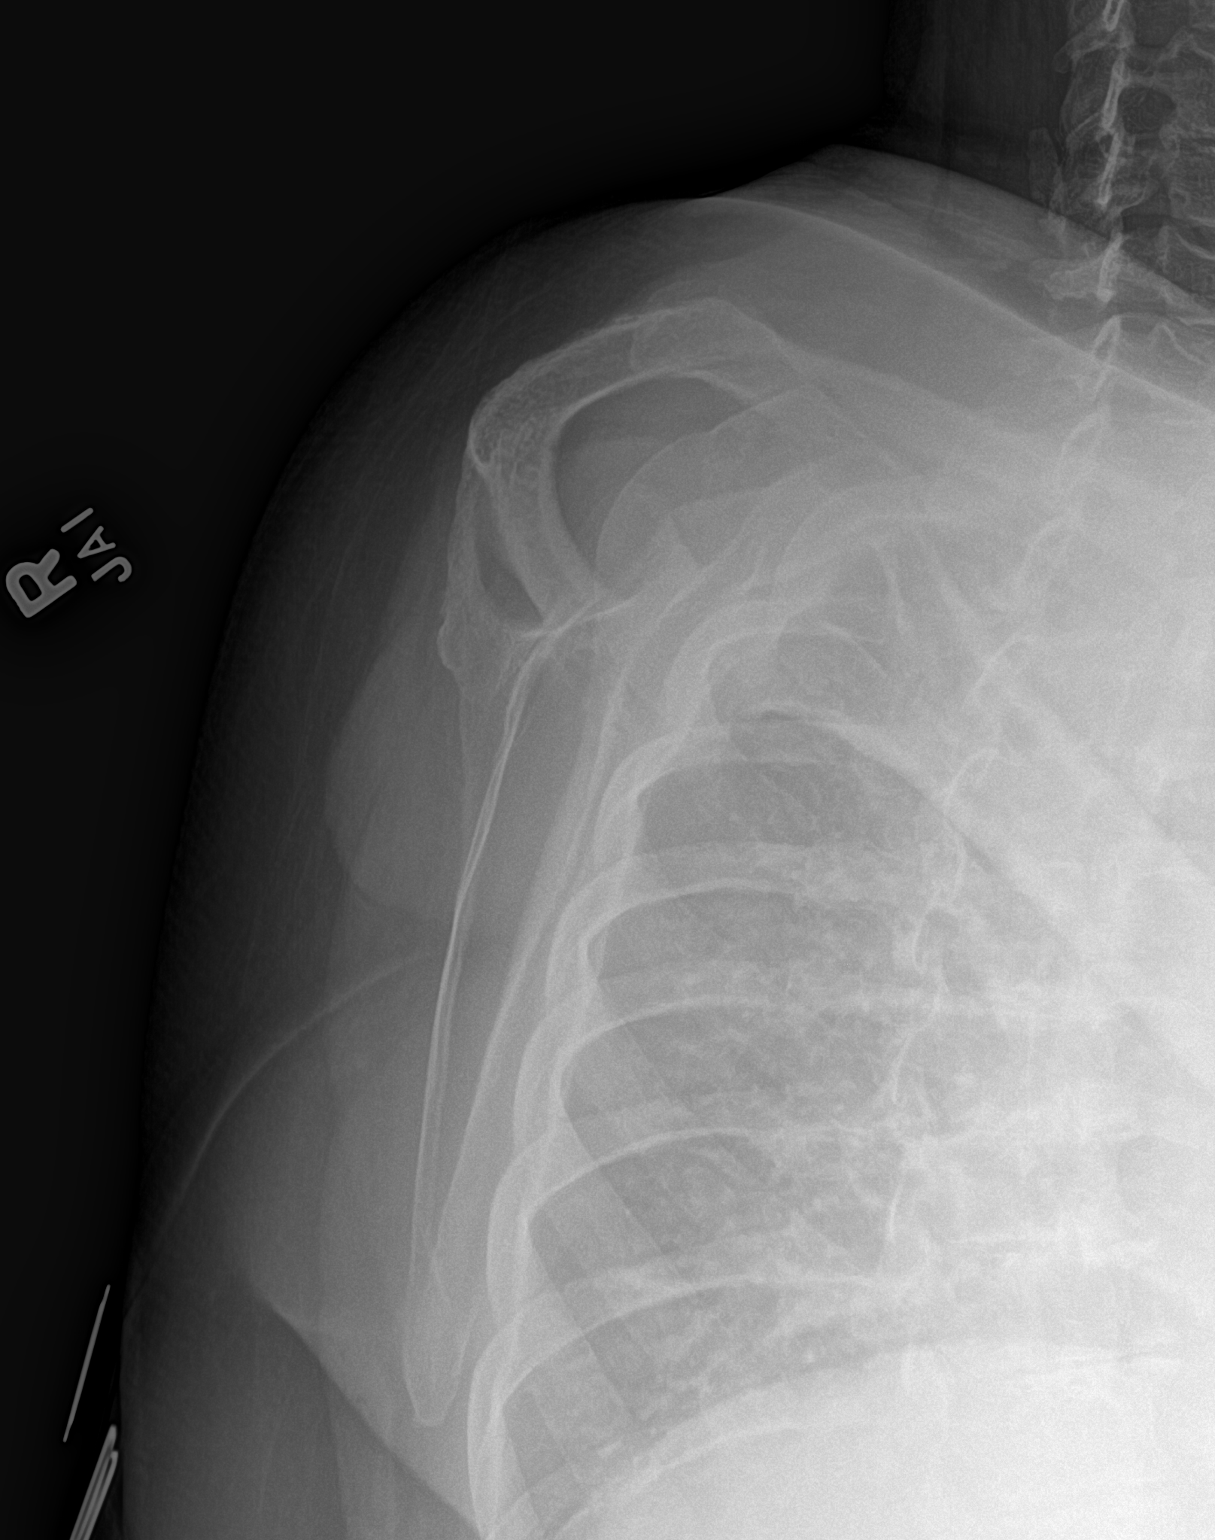

[shoulder axial]
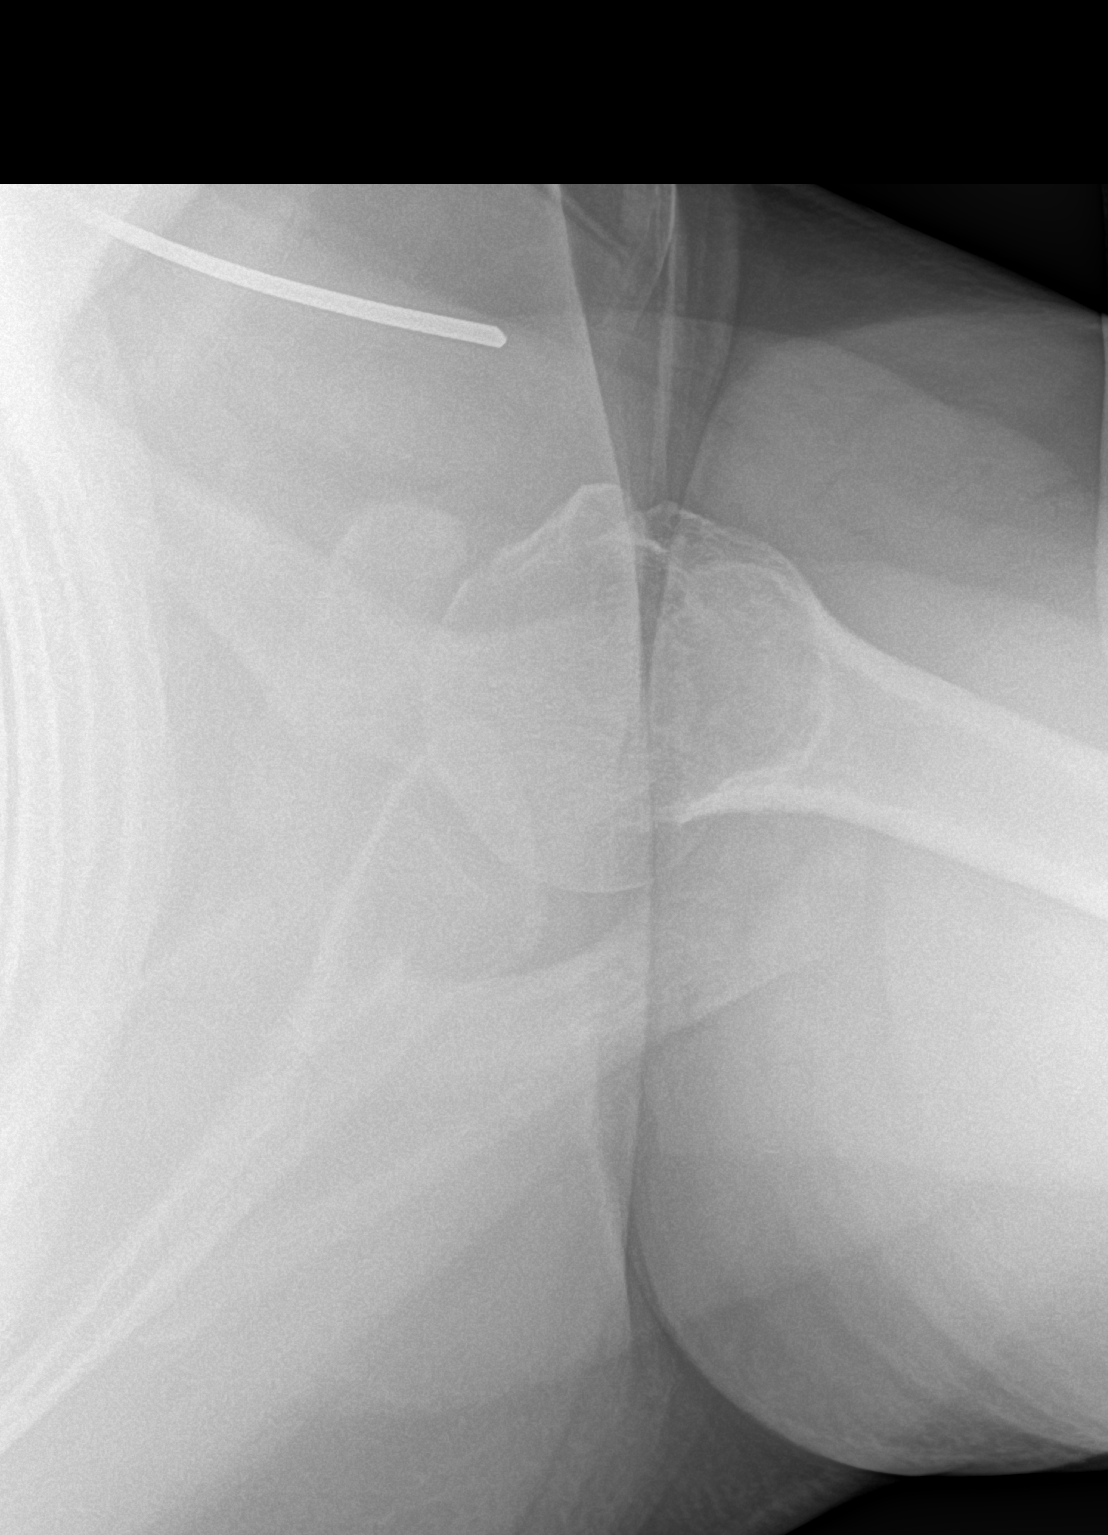

[3 of 3 positions shown; findings below may reference images not displayed]

FINDINGS: Mild soft tissue calcifications along the lateral aspect of the
humeral head only visualized on the frontal view. No fractures or
dislocations. No other abnormalities.
IMPRESSION: Mild soft tissue calcifications along the lateral aspect of the
humeral head suggests mild calcific tendinosis. No other
abnormalities identified.

## 2023-06-02 IMAGING — DX DG HIP (WITH OR WITHOUT PELVIS) 2-3V*R*
3 series · 3 of 3 positions shown · non-contrast
Comparison: None.

CLINICAL DATA: Pain without known injury

EXAM:
DG HIP (WITH OR WITHOUT PELVIS) 2-3V RIGHT

[pelvis ap]
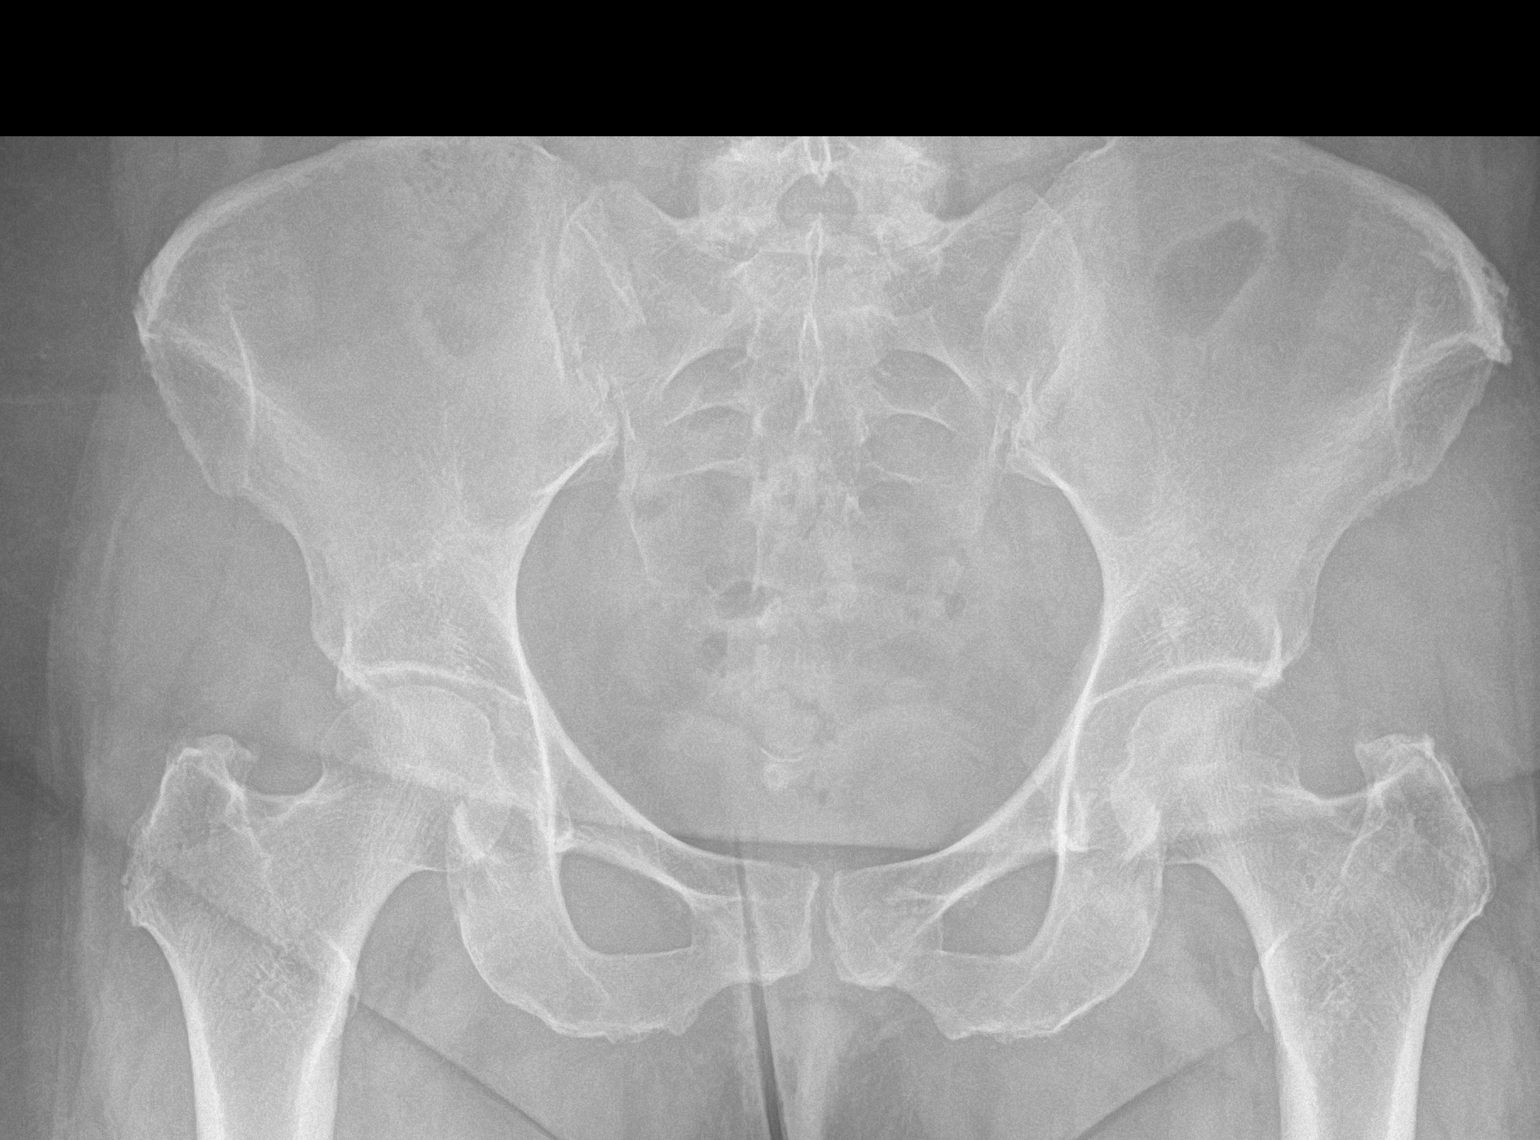

[hip ap]
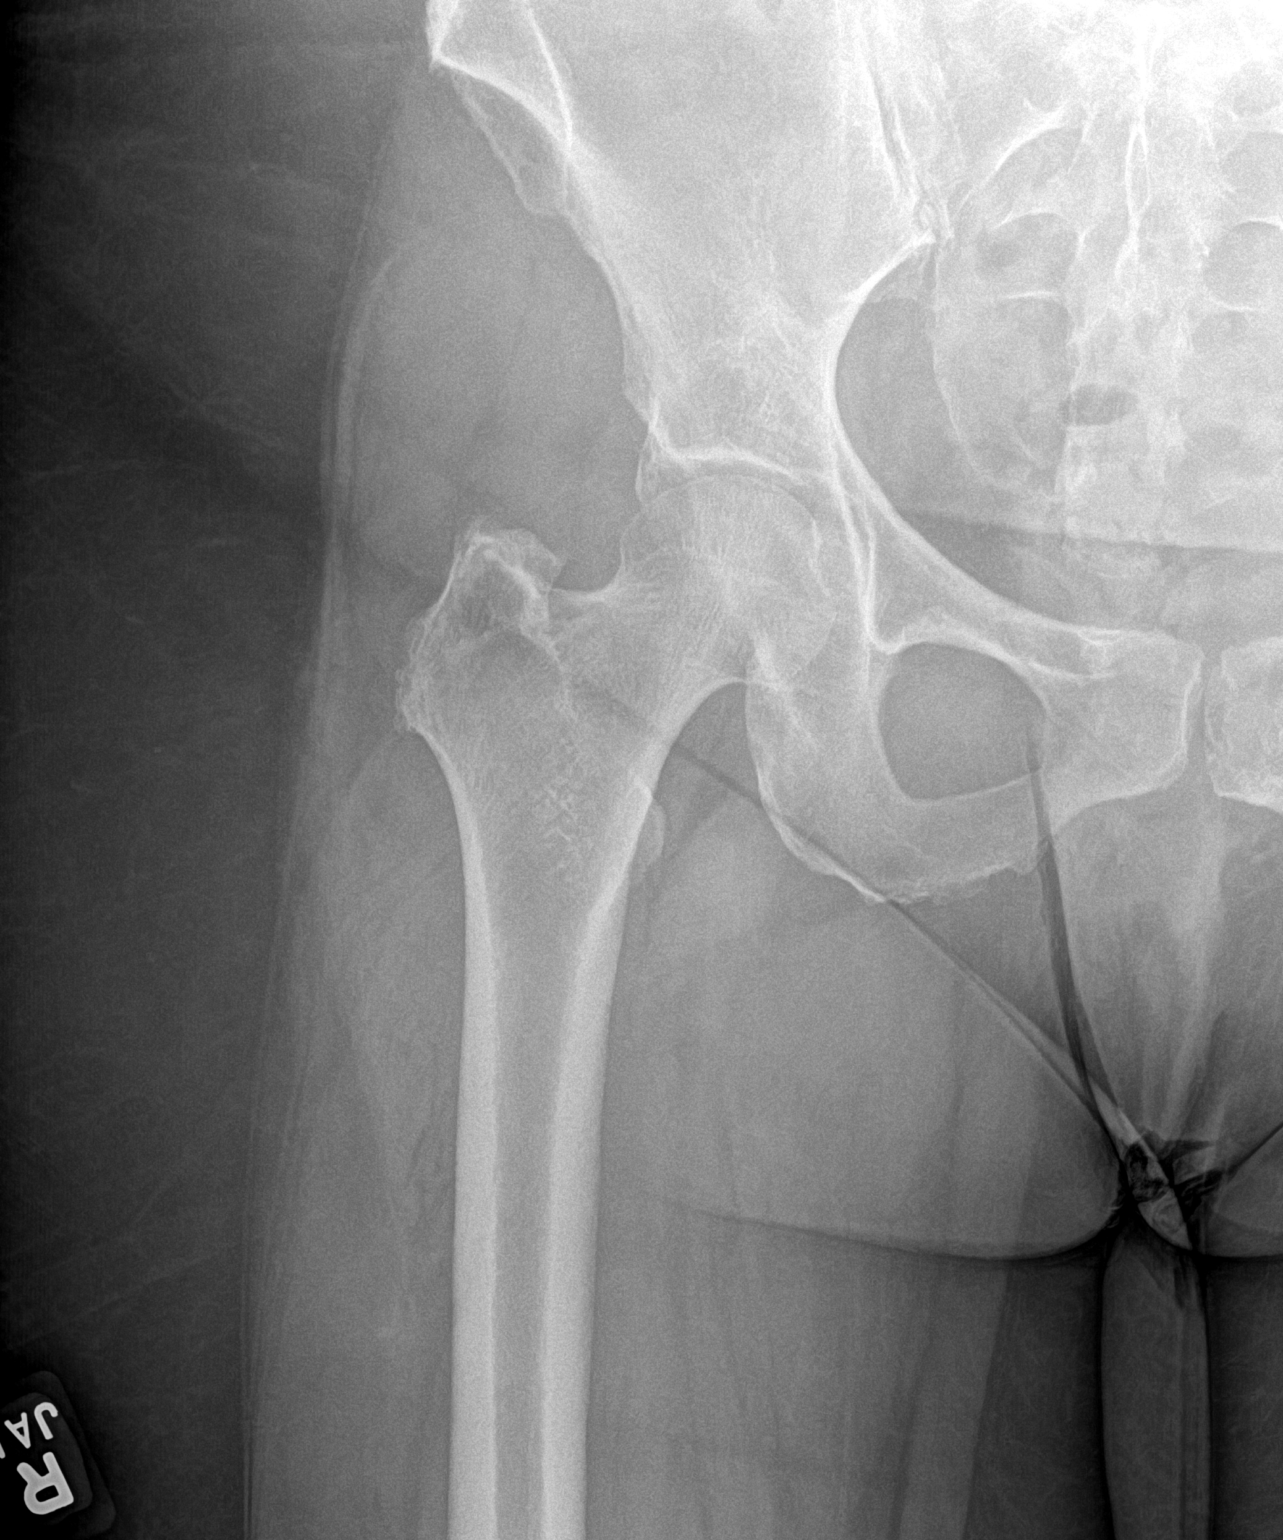

[hip frog leg]
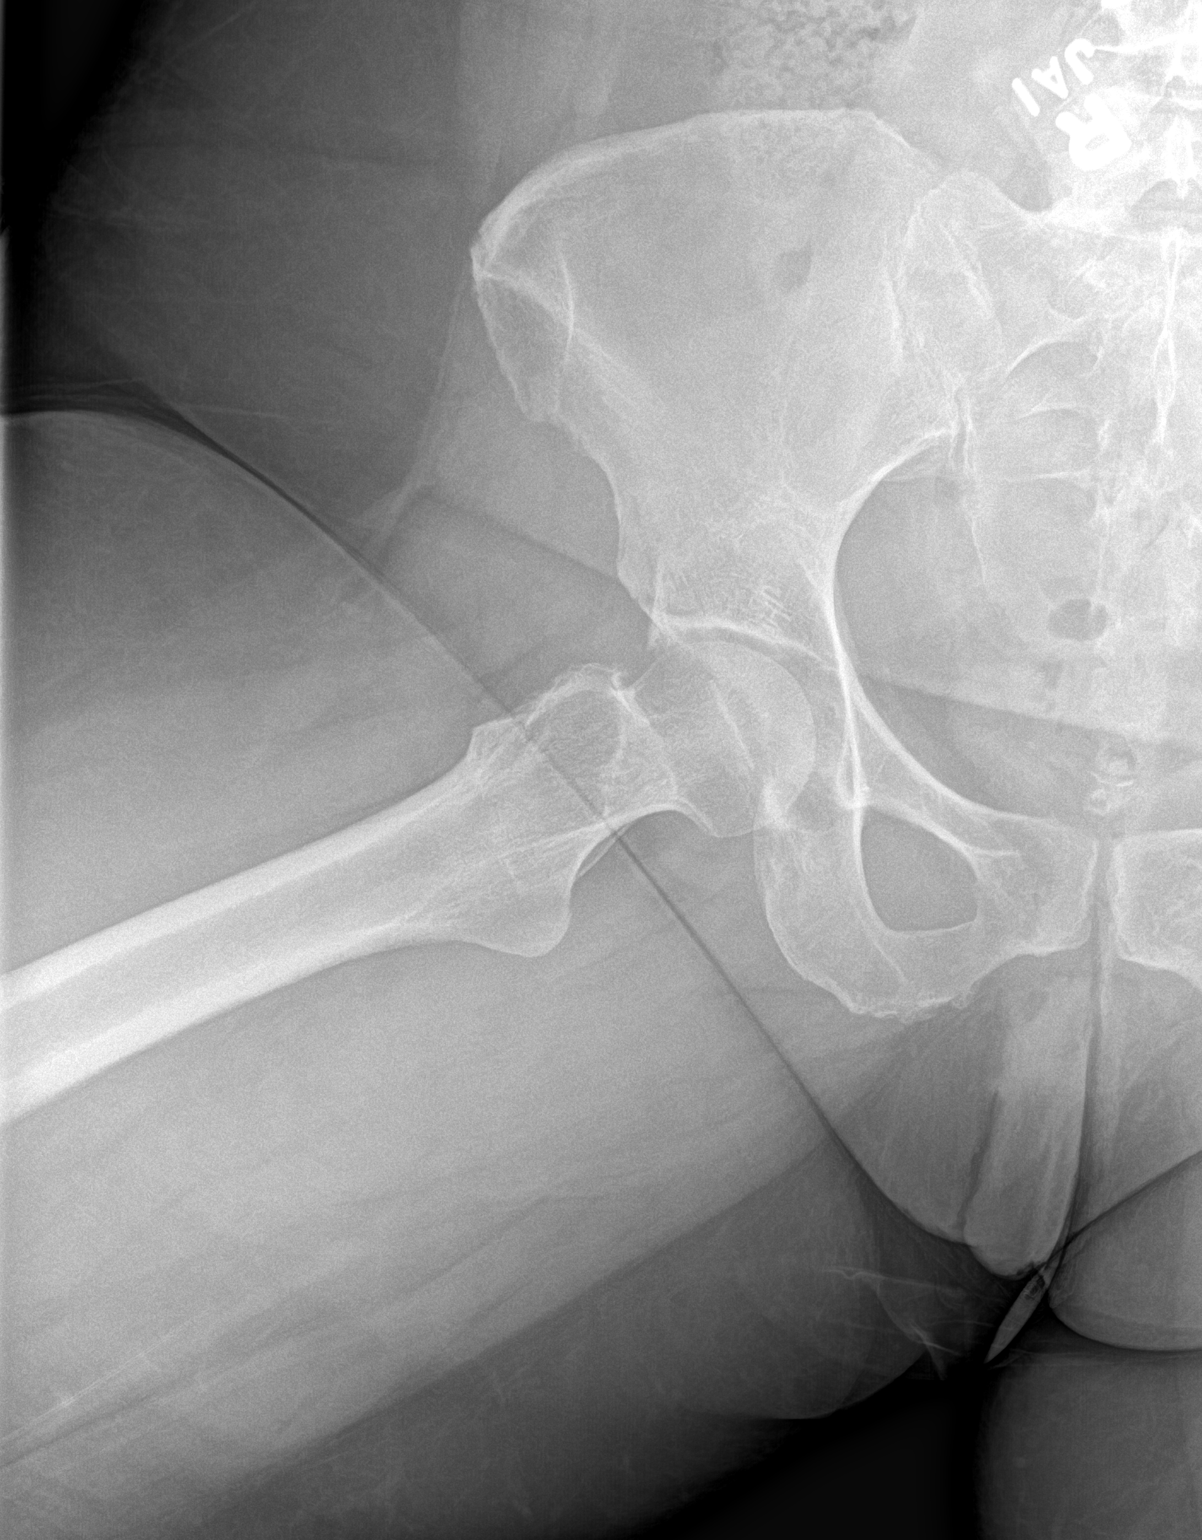

[3 of 3 positions shown; findings below may reference images not displayed]

FINDINGS: Mild degenerative changes in the right hip without loss of joint
space. No fractures or dislocations. No other abnormalities.
IMPRESSION: Mild degenerative changes in the right hip without loss of joint
space.
